# Patient Record
Sex: Female | Born: 1935
Health system: Southern US, Community
[De-identification: ages and names within clinical notes are randomized; demographics above are authoritative.]

## PROBLEM LIST (undated history)

## (undated) DIAGNOSIS — E119 Type 2 diabetes mellitus without complications: Secondary | ICD-10-CM

## (undated) DIAGNOSIS — I2510 Atherosclerotic heart disease of native coronary artery without angina pectoris: Secondary | ICD-10-CM

## (undated) DIAGNOSIS — I10 Essential (primary) hypertension: Secondary | ICD-10-CM

## (undated) DIAGNOSIS — E7800 Pure hypercholesterolemia, unspecified: Secondary | ICD-10-CM

## (undated) DIAGNOSIS — M1990 Unspecified osteoarthritis, unspecified site: Secondary | ICD-10-CM

## (undated) DIAGNOSIS — I509 Heart failure, unspecified: Secondary | ICD-10-CM

## (undated) HISTORY — DX: Type 2 diabetes mellitus without complications: E11.9

## (undated) HISTORY — DX: Unspecified osteoarthritis, unspecified site: M19.90

## (undated) HISTORY — PX: CORONARY ARTERY BYPASS GRAFT: SHX141

## (undated) HISTORY — PX: PACEMAKER INSERTION: SHX728

## (undated) HISTORY — DX: Pure hypercholesterolemia, unspecified: E78.00

## (undated) HISTORY — PX: CHOLECYSTECTOMY: SHX55

## (undated) HISTORY — DX: Essential (primary) hypertension: I10

---

## 2003-10-08 ENCOUNTER — Other Ambulatory Visit: Payer: Self-pay

## 2005-01-28 ENCOUNTER — Ambulatory Visit: Payer: Self-pay | Admitting: Gastroenterology

## 2005-06-29 ENCOUNTER — Ambulatory Visit: Payer: Self-pay | Admitting: Rheumatology

## 2005-09-07 ENCOUNTER — Emergency Department: Payer: Self-pay | Admitting: Emergency Medicine

## 2007-08-30 ENCOUNTER — Ambulatory Visit: Payer: Self-pay | Admitting: Family Medicine

## 2007-10-25 ENCOUNTER — Encounter: Payer: Self-pay | Admitting: Unknown Physician Specialty

## 2008-01-16 ENCOUNTER — Other Ambulatory Visit: Payer: Self-pay

## 2008-01-16 ENCOUNTER — Emergency Department: Payer: Self-pay | Admitting: Emergency Medicine

## 2008-05-22 ENCOUNTER — Ambulatory Visit: Payer: Self-pay | Admitting: Gastroenterology

## 2008-10-21 ENCOUNTER — Emergency Department: Payer: Self-pay | Admitting: Emergency Medicine

## 2008-10-22 ENCOUNTER — Emergency Department: Payer: Self-pay | Admitting: Unknown Physician Specialty

## 2009-02-26 ENCOUNTER — Emergency Department: Payer: Self-pay | Admitting: Emergency Medicine

## 2009-07-10 ENCOUNTER — Ambulatory Visit: Payer: Self-pay | Admitting: Family Medicine

## 2009-08-05 ENCOUNTER — Emergency Department: Payer: Self-pay | Admitting: Emergency Medicine

## 2009-09-06 ENCOUNTER — Ambulatory Visit: Payer: Self-pay | Admitting: Gastroenterology

## 2009-11-26 ENCOUNTER — Emergency Department: Payer: Self-pay | Admitting: Emergency Medicine

## 2010-02-18 ENCOUNTER — Ambulatory Visit: Payer: Self-pay | Admitting: Gastroenterology

## 2010-04-07 ENCOUNTER — Inpatient Hospital Stay: Payer: Self-pay | Admitting: Internal Medicine

## 2010-08-07 ENCOUNTER — Emergency Department: Payer: Self-pay | Admitting: Emergency Medicine

## 2010-09-11 ENCOUNTER — Emergency Department: Payer: Self-pay | Admitting: Emergency Medicine

## 2010-11-10 ENCOUNTER — Ambulatory Visit: Payer: Self-pay | Admitting: General Practice

## 2010-11-24 ENCOUNTER — Ambulatory Visit: Payer: Self-pay | Admitting: General Practice

## 2010-12-15 ENCOUNTER — Ambulatory Visit: Payer: Self-pay | Admitting: Urology

## 2010-12-16 ENCOUNTER — Ambulatory Visit: Payer: Self-pay | Admitting: Urology

## 2011-11-17 ENCOUNTER — Ambulatory Visit: Payer: Self-pay | Admitting: General Practice

## 2011-11-17 LAB — CBC WITH DIFFERENTIAL/PLATELET
Basophil %: 0.9 %
Eosinophil #: 0.2 10*3/uL (ref 0.0–0.7)
Eosinophil %: 2.7 %
HCT: 32.9 % — ABNORMAL LOW (ref 35.0–47.0)
Lymphocyte %: 28.1 %
MCH: 28.9 pg (ref 26.0–34.0)
MCHC: 33 g/dL (ref 32.0–36.0)
Monocyte #: 0.4 x10 3/mm (ref 0.2–0.9)
Monocyte %: 8.1 %
Neutrophil #: 3.3 10*3/uL (ref 1.4–6.5)
Neutrophil %: 60.2 %
Platelet: 204 10*3/uL (ref 150–440)
RBC: 3.76 10*6/uL — ABNORMAL LOW (ref 3.80–5.20)

## 2011-11-17 LAB — BASIC METABOLIC PANEL
Anion Gap: 5 — ABNORMAL LOW (ref 7–16)
BUN: 31 mg/dL — ABNORMAL HIGH (ref 7–18)
Calcium, Total: 9 mg/dL (ref 8.5–10.1)
Co2: 28 mmol/L (ref 21–32)
EGFR (Non-African Amer.): 45 — ABNORMAL LOW
Glucose: 183 mg/dL — ABNORMAL HIGH (ref 65–99)
Osmolality: 289 (ref 275–301)
Sodium: 139 mmol/L (ref 136–145)

## 2011-11-27 ENCOUNTER — Ambulatory Visit: Payer: Self-pay | Admitting: General Practice

## 2012-02-04 ENCOUNTER — Emergency Department: Payer: Self-pay | Admitting: *Deleted

## 2012-02-22 DIAGNOSIS — Z950 Presence of cardiac pacemaker: Secondary | ICD-10-CM | POA: Insufficient documentation

## 2012-02-24 ENCOUNTER — Ambulatory Visit: Payer: Self-pay | Admitting: Cardiology

## 2012-02-24 ENCOUNTER — Inpatient Hospital Stay: Payer: Self-pay | Admitting: Internal Medicine

## 2012-02-24 DIAGNOSIS — I4891 Unspecified atrial fibrillation: Secondary | ICD-10-CM

## 2012-02-24 LAB — BASIC METABOLIC PANEL
Calcium, Total: 9.3 mg/dL (ref 8.5–10.1)
Co2: 24 mmol/L (ref 21–32)
Creatinine: 1 mg/dL (ref 0.60–1.30)
EGFR (Non-African Amer.): 51 — ABNORMAL LOW
Sodium: 138 mmol/L (ref 136–145)

## 2012-02-24 LAB — CBC WITH DIFFERENTIAL/PLATELET
Basophil %: 0.4 %
Eosinophil #: 0.2 10*3/uL (ref 0.0–0.7)
HCT: 33.8 % — ABNORMAL LOW (ref 35.0–47.0)
HGB: 11.5 g/dL — ABNORMAL LOW (ref 12.0–16.0)
MCH: 29.6 pg (ref 26.0–34.0)
MCHC: 34.1 g/dL (ref 32.0–36.0)
Monocyte #: 0.7 x10 3/mm (ref 0.2–0.9)
Monocyte %: 9.6 %
Neutrophil #: 4.1 10*3/uL (ref 1.4–6.5)
Neutrophil %: 61 %
RDW: 13.3 % (ref 11.5–14.5)
WBC: 6.8 10*3/uL (ref 3.6–11.0)

## 2012-02-24 LAB — CBC
HCT: 35.5 % (ref 35.0–47.0)
HGB: 12 g/dL (ref 12.0–16.0)
MCH: 29 pg (ref 26.0–34.0)
MCHC: 33.7 g/dL (ref 32.0–36.0)
MCV: 86 fL (ref 80–100)
RBC: 4.12 10*6/uL (ref 3.80–5.20)

## 2012-02-24 LAB — COMPREHENSIVE METABOLIC PANEL
Albumin: 3.8 g/dL (ref 3.4–5.0)
Alkaline Phosphatase: 104 U/L (ref 50–136)
BUN: 27 mg/dL — ABNORMAL HIGH (ref 7–18)
Bilirubin,Total: 0.5 mg/dL (ref 0.2–1.0)
Calcium, Total: 9.4 mg/dL (ref 8.5–10.1)
Creatinine: 1.01 mg/dL (ref 0.60–1.30)
Glucose: 110 mg/dL — ABNORMAL HIGH (ref 65–99)
Osmolality: 287 (ref 275–301)
Sodium: 141 mmol/L (ref 136–145)
Total Protein: 7.6 g/dL (ref 6.4–8.2)

## 2012-02-24 LAB — PROTIME-INR
INR: 1
Prothrombin Time: 13.2 secs (ref 11.5–14.7)

## 2012-02-24 LAB — APTT: Activated PTT: 34.2 secs (ref 23.6–35.9)

## 2012-02-24 LAB — CK TOTAL AND CKMB (NOT AT ARMC): CK-MB: 1.6 ng/mL (ref 0.5–3.6)

## 2012-02-25 LAB — TROPONIN I: Troponin-I: 0.39 ng/mL — ABNORMAL HIGH

## 2012-02-25 LAB — CBC WITH DIFFERENTIAL/PLATELET
Basophil #: 0 10*3/uL (ref 0.0–0.1)
Eosinophil #: 0.2 10*3/uL (ref 0.0–0.7)
Eosinophil %: 3.4 %
HCT: 31.9 % — ABNORMAL LOW (ref 35.0–47.0)
Lymphocyte #: 2.1 10*3/uL (ref 1.0–3.6)
MCH: 29.7 pg (ref 26.0–34.0)
MCHC: 34.4 g/dL (ref 32.0–36.0)
MCV: 86 fL (ref 80–100)
Monocyte #: 0.5 x10 3/mm (ref 0.2–0.9)
Neutrophil #: 3.9 10*3/uL (ref 1.4–6.5)
Platelet: 205 10*3/uL (ref 150–440)
RDW: 13.6 % (ref 11.5–14.5)
WBC: 6.8 10*3/uL (ref 3.6–11.0)

## 2012-02-25 LAB — BASIC METABOLIC PANEL
Anion Gap: 7 (ref 7–16)
BUN: 27 mg/dL — ABNORMAL HIGH (ref 7–18)
EGFR (African American): 45 — ABNORMAL LOW
EGFR (Non-African Amer.): 39 — ABNORMAL LOW
Glucose: 122 mg/dL — ABNORMAL HIGH (ref 65–99)
Osmolality: 288 (ref 275–301)
Potassium: 4.6 mmol/L (ref 3.5–5.1)

## 2012-02-25 LAB — LIPID PANEL
Cholesterol: 167 mg/dL (ref 0–200)
HDL Cholesterol: 31 mg/dL — ABNORMAL LOW (ref 40–60)
Ldl Cholesterol, Calc: 73 mg/dL (ref 0–100)
Triglycerides: 313 mg/dL — ABNORMAL HIGH (ref 0–200)
VLDL Cholesterol, Calc: 63 mg/dL — ABNORMAL HIGH (ref 5–40)

## 2012-02-25 LAB — MAGNESIUM: Magnesium: 1.5 mg/dL — ABNORMAL LOW

## 2012-02-25 LAB — HEMOGLOBIN A1C: Hemoglobin A1C: 7 % — ABNORMAL HIGH (ref 4.2–6.3)

## 2012-02-25 LAB — CK TOTAL AND CKMB (NOT AT ARMC)
CK, Total: 47 U/L (ref 21–215)
CK, Total: 57 U/L (ref 21–215)

## 2012-03-02 ENCOUNTER — Ambulatory Visit: Payer: Self-pay | Admitting: Cardiology

## 2012-11-01 ENCOUNTER — Ambulatory Visit: Payer: Self-pay | Admitting: Unknown Physician Specialty

## 2012-12-04 ENCOUNTER — Ambulatory Visit: Payer: Self-pay | Admitting: Emergency Medicine

## 2013-11-01 ENCOUNTER — Ambulatory Visit: Payer: Self-pay | Admitting: Podiatry

## 2013-11-01 DIAGNOSIS — I4891 Unspecified atrial fibrillation: Secondary | ICD-10-CM | POA: Insufficient documentation

## 2013-11-01 DIAGNOSIS — Z951 Presence of aortocoronary bypass graft: Secondary | ICD-10-CM | POA: Insufficient documentation

## 2013-11-01 DIAGNOSIS — E785 Hyperlipidemia, unspecified: Secondary | ICD-10-CM | POA: Insufficient documentation

## 2013-11-15 ENCOUNTER — Ambulatory Visit (INDEPENDENT_AMBULATORY_CARE_PROVIDER_SITE_OTHER): Payer: Medicare HMO

## 2013-11-15 ENCOUNTER — Ambulatory Visit: Payer: Self-pay | Admitting: Podiatry

## 2013-11-15 ENCOUNTER — Ambulatory Visit (INDEPENDENT_AMBULATORY_CARE_PROVIDER_SITE_OTHER): Payer: Medicare HMO | Admitting: Podiatry

## 2013-11-15 ENCOUNTER — Encounter: Payer: Self-pay | Admitting: Podiatry

## 2013-11-15 VITALS — Ht 59.0 in | Wt 143.0 lb

## 2013-11-15 DIAGNOSIS — E1149 Type 2 diabetes mellitus with other diabetic neurological complication: Secondary | ICD-10-CM

## 2013-11-15 DIAGNOSIS — M129 Arthropathy, unspecified: Secondary | ICD-10-CM

## 2013-11-15 DIAGNOSIS — M1990 Unspecified osteoarthritis, unspecified site: Secondary | ICD-10-CM

## 2013-11-15 DIAGNOSIS — Q6650 Congenital pes planus, unspecified foot: Secondary | ICD-10-CM

## 2013-11-15 DIAGNOSIS — E1140 Type 2 diabetes mellitus with diabetic neuropathy, unspecified: Secondary | ICD-10-CM

## 2013-11-15 DIAGNOSIS — E1142 Type 2 diabetes mellitus with diabetic polyneuropathy: Secondary | ICD-10-CM

## 2013-11-15 NOTE — Patient Instructions (Signed)
Diabetes and Foot Care Diabetes may cause you to have problems because of poor blood supply (circulation) to your feet and legs. This may cause the skin on your feet to become thinner, break easier, and heal more slowly. Your skin may become dry, and the skin may peel and crack. You may also have nerve damage in your legs and feet causing decreased feeling in them. You may not notice minor injuries to your feet that could lead to infections or more serious problems. Taking care of your feet is one of the most important things you can do for yourself.  HOME CARE INSTRUCTIONS  Wear shoes at all times, even in the house. Do not go barefoot. Bare feet are easily injured.  Check your feet daily for blisters, cuts, and redness. If you cannot see the bottom of your feet, use a mirror or ask someone for help.  Wash your feet with warm water (do not use hot water) and mild soap. Then pat your feet and the areas between your toes until they are completely dry. Do not soak your feet as this can dry your skin.  Apply a moisturizing lotion or petroleum jelly (that does not contain alcohol and is unscented) to the skin on your feet and to dry, brittle toenails. Do not apply lotion between your toes.  Trim your toenails straight across. Do not dig under them or around the cuticle. File the edges of your nails with an emery board or nail file.  Do not cut corns or calluses or try to remove them with medicine.  Wear clean socks or stockings every day. Make sure they are not too tight. Do not wear knee-high stockings since they may decrease blood flow to your legs.  Wear shoes that fit properly and have enough cushioning. To break in new shoes, wear them for just a few hours a day. This prevents you from injuring your feet. Always look in your shoes before you put them on to be sure there are no objects inside.  Do not cross your legs. This may decrease the blood flow to your feet.  If you find a minor scrape,  cut, or break in the skin on your feet, keep it and the skin around it clean and dry. These areas may be cleansed with mild soap and water. Do not cleanse the area with peroxide, alcohol, or iodine.  When you remove an adhesive bandage, be sure not to damage the skin around it.  If you have a wound, look at it several times a day to make sure it is healing.  Do not use heating pads or hot water bottles. They may burn your skin. If you have lost feeling in your feet or legs, you may not know it is happening until it is too late.  Make sure your health care provider performs a complete foot exam at least annually or more often if you have foot problems. Report any cuts, sores, or bruises to your health care provider immediately. SEEK MEDICAL CARE IF:   You have an injury that is not healing.  You have cuts or breaks in the skin.  You have an ingrown nail.  You notice redness on your legs or feet.  You feel burning or tingling in your legs or feet.  You have pain or cramps in your legs and feet.  Your legs or feet are numb.  Your feet always feel cold. SEEK IMMEDIATE MEDICAL CARE IF:   There is increasing redness,   swelling, or pain in or around a wound.  There is a red line that goes up your leg.  Pus is coming from a wound.  You develop a fever or as directed by your health care provider.  You notice a bad smell coming from an ulcer or wound. Document Released: 06/26/2000 Document Revised: 03/01/2013 Document Reviewed: 12/06/2012 [employer] Patient Information 2014 [employer].  

## 2013-11-15 NOTE — Progress Notes (Signed)
   Subjective:    Patient ID: Veronica Short, female    DOB: 03-Jan-1924, 80 y.o.   MRN: [id number]  HPI Comments: Both feet been hurting and burning.. Its been going on for years. Dr Alberteen Spindleline has been treating for toenails and the pain. Does nothing for me   Diabetes Hypoglycemia symptoms include dizziness, headaches and nervousness/anxiousness. Associated symptoms include fatigue and weakness.      Review of Systems  Constitutional: Positive for activity change, appetite change and fatigue.  HENT: Positive for ear pain.   Eyes: Positive for pain.  Respiratory: Positive for cough and shortness of breath.   Endocrine:       Diabetes  Excessive thirst   Genitourinary: Positive for frequency.  Musculoskeletal: Positive for back pain.       Muscle pain  Joint pain  Difficulty walking   Neurological: Positive for dizziness, weakness and headaches.  Psychiatric/Behavioral: The patient is nervous/anxious.        Objective:   Physical Exam: I have reviewed her past medical history medications allergies surgeries social history and review of systems. Pulses are nonpalpable bilateral. Neurologic sensorium is diminished per since once the monofilament. Deep tendon reflexes are in non-elicitable. Muscle strength is limited to dorsiflexion and plantar flexion of only. She has no inversion or eversion capabilities. Orthopedic evaluation demonstrates severe pes planus with posterior tibial tendon dysfunction resulting in lateralization abduction of the foot on the leg. Radiographic evaluation confirms severe osteoarthritis and pes planus bilateral. Severe hammertoe deformities also noted bilateral. I am unable to get her heel into a neutral position beneath her leg. Cutaneous evaluation demonstrates thick yellow dystrophic clinically mycotic nails bilaterally.        Assessment & Plan:  Assessment: Diabetic peripheral neuropathy with severe osteoarthritis and pes planus with posterior tibial  tendon dysfunction.  Plan: Discussed etiology pathology conservative versus surgical therapies I expressed to her and her family that I would like to get her started on Neurontin. At this point she states that she's tried Neurontin before but a culture to be dizzy. For this reason I have given her samples of 75 mg and Lyrica one by mouth each bedtime and I will followup with her in one month at which time we will debride her toenails. We will also reevaluate her medication.

## 2013-11-20 DIAGNOSIS — I2510 Atherosclerotic heart disease of native coronary artery without angina pectoris: Secondary | ICD-10-CM | POA: Insufficient documentation

## 2013-11-20 DIAGNOSIS — M810 Age-related osteoporosis without current pathological fracture: Secondary | ICD-10-CM | POA: Insufficient documentation

## 2013-11-20 DIAGNOSIS — E669 Obesity, unspecified: Secondary | ICD-10-CM | POA: Insufficient documentation

## 2013-11-20 DIAGNOSIS — M5137 Other intervertebral disc degeneration, lumbosacral region: Secondary | ICD-10-CM | POA: Insufficient documentation

## 2013-11-20 DIAGNOSIS — I359 Nonrheumatic aortic valve disorder, unspecified: Secondary | ICD-10-CM | POA: Insufficient documentation

## 2013-11-20 DIAGNOSIS — E119 Type 2 diabetes mellitus without complications: Secondary | ICD-10-CM | POA: Insufficient documentation

## 2013-11-27 ENCOUNTER — Telehealth: Payer: Self-pay | Admitting: *Deleted

## 2013-11-27 NOTE — Telephone Encounter (Signed)
Called and spoke with pt letting her know she had appt at [facility] letting her know she has an appt thur 5.21.15 at 3:15. Pt states she did know about the appt. Pt also stated she could not take the pill dr Al Corpushyatt gave her. It caused her to be dizzy and gave her the shakes. She did stop taking the rx for lyrica.

## 2014-01-08 ENCOUNTER — Ambulatory Visit (INDEPENDENT_AMBULATORY_CARE_PROVIDER_SITE_OTHER): Payer: Medicare HMO | Admitting: Podiatry

## 2014-01-08 ENCOUNTER — Encounter: Payer: Self-pay | Admitting: Podiatry

## 2014-01-08 DIAGNOSIS — M79609 Pain in unspecified limb: Secondary | ICD-10-CM

## 2014-01-08 DIAGNOSIS — M79676 Pain in unspecified toe(s): Secondary | ICD-10-CM

## 2014-01-08 DIAGNOSIS — B353 Tinea pedis: Secondary | ICD-10-CM

## 2014-01-08 NOTE — Progress Notes (Signed)
She presents today with a chief complaint of painful elongated toenails.  Objective: Nails are thick yellow dystrophic with mycotic and painful palpation.  Assessment: Pain in limb secondary to onychomycosis 1 through 5 bilateral.  Plan: Debridement of nails 1 through 5 bilateral covered service secondary to pain 

## 2014-03-23 DIAGNOSIS — M1990 Unspecified osteoarthritis, unspecified site: Secondary | ICD-10-CM | POA: Insufficient documentation

## 2014-04-16 ENCOUNTER — Ambulatory Visit (INDEPENDENT_AMBULATORY_CARE_PROVIDER_SITE_OTHER): Payer: Medicare HMO | Admitting: Podiatry

## 2014-04-16 DIAGNOSIS — B351 Tinea unguium: Secondary | ICD-10-CM

## 2014-04-16 DIAGNOSIS — M79676 Pain in unspecified toe(s): Secondary | ICD-10-CM

## 2014-04-16 NOTE — Progress Notes (Signed)
She presents today with chief complaint of painful elongated toenails. ° °Objective: Pulses remain palpable. Hammertoe Deformities. Nails are thick yellow dystrophic onychomycotic and painful palpation. ° °Assessment: Pain in limb secondary to onychomycosis. ° °Plan: Read nails 1 through 5 bilateral. °

## 2014-07-17 DIAGNOSIS — E119 Type 2 diabetes mellitus without complications: Secondary | ICD-10-CM | POA: Diagnosis not present

## 2014-07-17 DIAGNOSIS — E780 Pure hypercholesterolemia: Secondary | ICD-10-CM | POA: Diagnosis not present

## 2014-07-17 DIAGNOSIS — Z79899 Other long term (current) drug therapy: Secondary | ICD-10-CM | POA: Diagnosis not present

## 2014-07-23 ENCOUNTER — Ambulatory Visit: Payer: Medicare HMO | Admitting: Podiatry

## 2014-07-23 ENCOUNTER — Ambulatory Visit (INDEPENDENT_AMBULATORY_CARE_PROVIDER_SITE_OTHER): Payer: Medicare HMO | Admitting: Podiatry

## 2014-07-23 DIAGNOSIS — B351 Tinea unguium: Secondary | ICD-10-CM

## 2014-07-23 DIAGNOSIS — M79676 Pain in unspecified toe(s): Secondary | ICD-10-CM

## 2014-07-23 NOTE — Progress Notes (Signed)
She presents today with chief complaint of painful elongated toenails.  Objective: Pulses remain palpable. Hammertoe Deformities. Nails are thick yellow dystrophic onychomycotic and painful palpation.  Assessment: Pain in limb secondary to onychomycosis.  Plan: Read nails 1 through 5 bilateral.

## 2014-09-18 DIAGNOSIS — E119 Type 2 diabetes mellitus without complications: Secondary | ICD-10-CM | POA: Diagnosis not present

## 2014-09-28 DIAGNOSIS — E780 Pure hypercholesterolemia: Secondary | ICD-10-CM | POA: Diagnosis not present

## 2014-09-28 DIAGNOSIS — E119 Type 2 diabetes mellitus without complications: Secondary | ICD-10-CM | POA: Diagnosis not present

## 2014-09-28 DIAGNOSIS — I10 Essential (primary) hypertension: Secondary | ICD-10-CM | POA: Diagnosis not present

## 2014-09-28 DIAGNOSIS — I4891 Unspecified atrial fibrillation: Secondary | ICD-10-CM | POA: Diagnosis not present

## 2014-10-11 DIAGNOSIS — I4891 Unspecified atrial fibrillation: Secondary | ICD-10-CM | POA: Diagnosis not present

## 2014-10-12 ENCOUNTER — Ambulatory Visit (INDEPENDENT_AMBULATORY_CARE_PROVIDER_SITE_OTHER): Payer: Commercial Managed Care - HMO | Admitting: Podiatry

## 2014-10-12 DIAGNOSIS — M779 Enthesopathy, unspecified: Secondary | ICD-10-CM | POA: Diagnosis not present

## 2014-10-12 DIAGNOSIS — M79676 Pain in unspecified toe(s): Secondary | ICD-10-CM

## 2014-10-12 DIAGNOSIS — B351 Tinea unguium: Secondary | ICD-10-CM | POA: Diagnosis not present

## 2014-10-12 MED ORDER — TRIAMCINOLONE ACETONIDE 10 MG/ML IJ SUSP
10.0000 mg | Freq: Once | INTRAMUSCULAR | Status: AC
  Administered 2014-10-12: 10 mg

## 2014-10-12 NOTE — Progress Notes (Signed)
Subjective:     Patient ID: Marcello Fennelrma R Soto, female   DOB: 22-Jan-1924, 81 y.o.   MRN: [id number]  HPI patient presents with painful nailbeds 1-5 both feet that are yellow and dystrophic and also is noted to have pain in the ankle joint of both feet with inability to walk comfortably with severe flatfoot deformity noted   Review of Systems     Objective:   Physical Exam Inflammatory capsulitis of the sinus tarsi bilateral with nail disease and thickness 1-5 of both feet that are painful with yellow dystrophic disease    Assessment:     Capsulitis of the sinus tarsi bilateral along with mycotic nail infections 1-5 both feet with pain    Plan:     Debride painful nailbeds 1-5 of both feet with no iatrogenic bleeding noted and injected the capsule of the sinus tarsi bilateral 3 mg Kenalog 5 mg Xylocaine Marcaine mixture that was tolerated well and gave relief of symptoms

## 2014-10-12 NOTE — Progress Notes (Signed)
She presents today with chief complaint of painful elongated toenails.  Objective: Pulses remain palpable. Hammertoe Deformities. Nails are thick yellow dystrophic onychomycotic and painful palpation.  Assessment: Pain in limb secondary to onychomycosis.  Plan: Read nails 1 through 5 bilateral.

## 2014-10-14 DIAGNOSIS — M19012 Primary osteoarthritis, left shoulder: Secondary | ICD-10-CM | POA: Diagnosis not present

## 2014-10-14 DIAGNOSIS — M25512 Pain in left shoulder: Secondary | ICD-10-CM | POA: Diagnosis not present

## 2014-10-22 ENCOUNTER — Other Ambulatory Visit: Payer: Medicare HMO

## 2014-10-30 NOTE — Discharge Summary (Signed)
PATIENT NAMMarcello Short:  Campillo, Dorlisa R MR#:  [id number] DATE OF BIRTH:  1924-03-17  DATE OF ADMISSION:  02/24/2012 DATE OF DISCHARGE:  02/25/2012  DIAGNOSES: Atrial fibrillation with rapid ventricular response (RVR), history of coronary artery disease status post coronary artery bypass graft (CABG) with subsequent angioplasty and stenting, hypertension, diabetes, gastroesophageal reflux disease, depression, anxiety, elevated troponin due to demand ischemia, hypomagnesemia.   DISPOSITION: The patient is being discharged home.   FOLLOWUP: Follow up with primary care physician, Dr. Randa LynnLamb, and cardiologist Dr. Darrold JunkerParaschos in 1 to 2 weeks after discharge.   DIET: Low sodium, 1800 calorie ADA diet.   ACTIVITY: As tolerated.  The patient has been advised to hold her aspirin and Plavix as advised for upcoming pacemaker placement.  DISCHARGE MEDICATIONS: Lipitor 20 mg daily, omeprazole 20 mg 1 capsule in the morning half hour before breakfast, Plavix 75 mg daily, hydrochlorothiazide/lisinopril12.5/20 one tablet daily, atenolol 100 mg daily, calcium and vitamin D 1 tablet daily, Xanax 0.25 mg  half to 1 tablet b.i.d. as needed, vitamin B12 1000 mcg daily, artificial tears two drops to affected eye once a day, aspirin 325 mg daily.  LABORATORY, DIAGNOSTIC AND RADIOLOGICAL DATA: CBC essentially normal other than hemoglobin of 11.5. Chest x-ray: No acute abnormality. Troponins 0.28 to 0.39. VLDL 63, LDL 73. Magnesium 1.5. Cholesterol 167, triglycerides 313, HDL 331. Hemoglobin A1c 7.   HOSPITAL COURSE: The patient is an 81 year old female with past medical history of coronary artery disease status post coronary artery bypass graft with subsequent stenting and angioplasty, diabetes, anxiety, depression and atrial fibrillation. The patient is scheduled to have a pacemaker placed this coming Wednesday. She went for a preop exam and was found to be tachycardic; therefore, she was sent to the urgent care who referred her to  the Emergency Room. From the Emergency Room, the patient got admitted because her heart rate was high, and she had borderline elevated troponin.  Following hospitalization, the patient has had no symptoms. Her heart rate has been in the 120s. Her troponins are mildly elevated, but she has no chest pain. The patient was seen by Dr. Darrold JunkerParaschos who recommended discharging the patient home.  The patient is scheduled for a pacemaker coming Wednesday. She should hold her Plavix and aspirin as advised. Dr. Darrold JunkerParaschos did not want to start her on any new rate-lowering agents since she had pauses on Cardizem recently. As per Dr. Darrold JunkerParaschos, a slightly high rate until insertion of pacemaker is acceptable. He had thought that the patient's anxiety also played a major part in her elevated heart rate. This plan was discussed with the patient and the family, and they were comfortable with the plan to discharge.  She had minimally elevated troponin, which was found to be due to demand ischemia from her elevated heart rate.  Her diabetes is overall controlled. Hemoglobin A1c is 7.  She has elevated VLDL and triglycerides, but her LDL is at goal. She is on statin therapy.  She had low magnesium, which was supplemented.  The patient is being discharged home in a stable condition. She has been advised to hold her aspirin and Plavix until the pacemaker insertion on coming Wednesday.   TIME SPENT: 45 minutes.    ____________________________ Darrick MeigsSangeeta Quinnetta Roepke, MD sp:kma D: 02/25/2012 15:52:42 ET T: 02/26/2012 12:03:01 ET JOB#: [id number]  cc: Darrick MeigsSangeeta Arjen Deringer, MD, <Dictator> Darrick MeigsSANGEETA Ghazi Rumpf MD ELECTRONICALLY SIGNED 02/26/2012 15:01

## 2014-10-30 NOTE — Op Note (Signed)
PATIENT NAME:  Veronica Short, Veronica R MR#:  [id number] DATE OF BIRTH:  May 23, 1924  DATE OF PROCEDURE:  03/02/2012  PRIMARY CARE PHYSICIAN: Alonna BucklerAndrew Lamb, MD   PREPROCEDURE DIAGNOSIS:  1. Symptomatic bradycardia and sick sinus syndrome.  2. Atrial fibrillation.   PROCEDURE: Single-chamber pacemaker implantation.   POSTPROCEDURE DIAGNOSIS: Intermittent ventricular pacing.   INDICATION: The patient is an 81 year old female with new onset of atrial fibrillation. The patient recently was experiencing rapid atrial fibrillation and was started on Cardizem in addition to Atenolol. Subsequently, the patient developed presyncopal symptoms. Here, Holter monitor revealed atrial fibrillation with pauses with RR intervals of 5.77 seconds. Cardizem had to be discontinued. The procedure, risks, benefits, and alternatives of pacemaker implantation were explained to the patient and informed written consent was obtained.   DESCRIPTION OF PROCEDURE: She was brought to the Operating Room in a fasting state. The left pectoral region was prepped and draped in the usual sterile manner. Anesthesia was obtained with 1% Xylocaine locally. A 6 cm incision was performed over the left pectoral region. The pacemaker pocket was generated by electrocautery and blunt dissection. Access was attained to the left subclavian vein by fine needle aspiration. A Medtronic ventricular lead was positioned in the right ventricular apical septum. After proper thresholds were obtained, the lead was sutured in place. The pacemaker pocket was irrigated with gentamicin solution. The lead was connected to a single-chamber rate responsive pacemaker generator and positioned in the pocket. The pocket was closed with 2-0 and 4-0 Vicryl, respectively. Steri-Strips and pressure dressing were applied. ____________________________ Veronica MillardAlexander Elysia Grand, MD ap:cbb D: 03/02/2012 13:35:47 ET T: 03/02/2012 15:19:44 ET JOB#: [id number] cc: Veronica MillardAlexander Aerilynn Goin, MD,  <Dictator> Veronica MillardALEXANDER Jamielee Mchale MD ELECTRONICALLY SIGNED 03/16/2012 9:00

## 2014-10-30 NOTE — H&P (Signed)
PATIENT NAMMarcello Short:  Cottam, Shala R MR#:  [id number] DATE OF BIRTH:  07-13-1924  DATE OF ADMISSION:  02/24/2012  REFERRING PHYSICIAN: Dr. Enedina FinnerGoli   PCP: Dr. Randa LynnLamb   CARDIOLOGIST: Dr. Darrold JunkerParaschos   CHIEF COMPLAINT: Atrial fibrillation with RVR.   HISTORY OF PRESENT ILLNESS: The patient is a pleasant 81 year old Caucasian female with history of CAD status post CABG and stenting and what appears to be sick sinus syndrome with pauses of up to six seconds who was to have a permanent pacemaker placed by Dr. Darrold JunkerParaschos on the 21st of this month who was at preop exam today and was found to have tachycardia and atrial fibrillation with RVR and referred here. Of note, the patient and family state that the patient was diagnosed with atrial fibrillation about a week and a half to two weeks ago. They don't know how long she has been having the atrial fibrillation. She was placed on a monitor around the same time and was started on diltiazem 180 mg XL nightly. The Holter monitor picked up bradycardia with rates of 50's per family and the Cardizem was stopped. Since then she has been having weakness and some dizziness. There is no chest pain. There is some shortness of breath and some dyspnea on exertion. After she came here she was found with atrial fibrillation, rates of 190's, and given the elevated troponin of 0.28 hospitalist service was contacted for further evaluation and management. Of note, the patient has received metoprolol IV x2. She is on the monitor and the rate goes from 130's to 140's, however, does not appear to be atrial fibrillation and is more sustained. She is complaining of a burning in her chest without any chest pains currently.   PAST MEDICAL HISTORY:  1. Coronary artery disease, status post CABG in 1992. 2. History of angioplasty and stenting, the recent one being in 2012. 3. Bladder suspension surgery. 4. Cholecystectomy. 5. Hypertension. 6. Diabetes.  7. Gastroesophageal reflux disease.   8. Depression. 9. Arthritis.   ALLERGIES: Penicillin and morphine.   CURRENT MEDICATIONS:  1. Alprazolam 0.25 mg one-half to 1 tab 2 times a day as needed for anxiety.  2. Artificial tears two drops in affected eye as needed.  3. Aspirin 325 mg daily.  4. Atenolol 100 mg daily.  5. Lipitor 20 mg daily.  6. Calcium with D 1 tab daily.  7. Hydrochlorothiazide/lisinopril 12.5/20 mg 1 tab daily.  8. Omeprazole 20 mg daily.  9. Plavix 75 mg daily.  10. Vitamin B12 1000 mcg daily.   SOCIAL HISTORY: No tobacco, alcohol, or drug use. Lives with her husband.   FAMILY HISTORY: Positive for coronary artery disease.  REVIEW OF SYSTEMS: CONSTITUTIONAL: Positive for fatigue and weakness. No weight changes. EYES: No blurry vision or double vision. ENT: No tinnitus or hearing loss. RESPIRATORY: Positive for on and off cough without wheezing. Positive dyspnea on exertion. CARDIOVASCULAR: No chest pain. No orthopnea. Occasional swelling in the legs. History of recently diagnosed atrial fibrillation. No history of syncope. Positive for palpitations. Positive for high blood pressure. GI: No nausea, vomiting, or diarrhea. No abdominal pain. Positive for GERD. No dark stools. No bloody stools. GU: No dysuria or hematuria. HEME/LYMPH: No anemia or easy bruising. SKIN: Positive for a rash that started on Sunday after starting Flagyl for bacterial vaginosis. Rash on the trunk and extremities. Positive for itch. MUSCULOSKELETAL: Chronic arthritis. NEUROLOGIC: Global weakness. PSYCH: Positive for anxiety.   PHYSICAL EXAMINATION:   VITAL SIGNS: Temperature on arrival 97, initial  pulse rate 76, blood pressure 148/82, respiratory rate 20. Last blood pressure 149/91. Last heart rate 140.   GENERAL: The patient is an elderly Caucasian female laying in bed in no obvious distress.   HEENT: Normocephalic, atraumatic. Pupils are equal and reactive. Anicteric sclerae. Extraocular muscles intact. Moist mucous membranes.    NECK: Supple. No cervical lymphadenopathy.   CARDIOVASCULAR: S1, S2, tachycardic. No murmurs appreciated. No rubs or gallops.   LUNGS: Clear to auscultation without wheezing or rhonchi. Good effort.   ABDOMEN: Soft, nontender, nondistended. Positive bowel sounds in all quadrants. No organomegaly appreciated.   EXTREMITIES: Some varicose veins, otherwise no lower extremity edema.   SKIN: Positive for irregular patchy rash bilateral extremities more on the lower than upper and some in the right flank. No oozing or drainage, greater than 1 cm mostly with irregular borders.   NEUROLOGIC: Cranial nerves II through XII grossly intact. Strength is 5/5 in all extremities.   PSYCH: Awake, alert, oriented x3, conversant, pleasant.   LABORATORY, DIAGNOSTIC, AND RADIOLOGICAL DATA: Glucose 110, BUN 27, creatinine 1.01, GFR 51. LFTs within normal limits. Troponin 0.28. CK-MB 1.6. Hemoglobin 12, hematocrit 35.5, WBC 7.3, platelets 220. INR 1.   EKG atrial fibrillation, nonspecific ST abnormalities, rate 93.   Chest, PA and lateral, showing mild cardiomegaly with CABG changes. No acute cardiopulmonary disease.   ASSESSMENT AND PLAN: We have a pleasant 81 year old Caucasian female with history of recent diagnosis of atrial fibrillation who is being evaluated by Dr. Darrold Junker for permanent pacemaker placement for what appears to be sick sinus syndrome with bradycardia and was being evaluated in the preop clinic and noted to have atrial fibrillation with RVR and referred here. Per family, the atrial fibrillation has been going on for at least a week and a half or so but it is unclear when it initially started. She had bradycardic episode with the diltiazem 180 mg dose. At this point she has had two doses of IV metoprolol and, per monitor, at least is not in atrial fibrillation as the rhythm is more sustained and regular but tachycardiac. I think the patient likely has either sinus tachycardia versus possible  SVT after metoprolol IV. At this point we would start low dose diltiazem and place her on a monitor and check for any bradycardia or pauses. Obtain a Cardiology consult. I would also start heparin drip at this point. Resume aspirin and order an echocardiogram. I would hold the hydrochlorothiazide and ACE inhibitor to prevent hypotension. The patient does have [place] score at least 2. The patient does have positive troponin. Although she does not complain of any chest pain, she complains of chest burning and given her CAD history, CABG and stenting, I would rule out acute coronary syndrome. It is also possible that she is having demand ischemia from the tachycardia and atrial fibrillation. It is also possible she is having an NSTEMI. I would cycle the troponins, give aspirin, and give her a dose of Plavix for today and Cardiology can hold it if they deem fit tomorrow. I would also resume her beta-blocker, statin, and get echocardiogram and start on nitroglycerin sublingual p.r.n. for chest pain. I would monitor blood pressure without the ACE inhibitor and ARB. The patient does appear to have CKD, stage III, which appears to be chronic and stable. The patient also does appear to have a rash started after Flagyl. I would give her p.r.n. Benadryl.   CODE STATUS: The patient is FULL CODE.   TOTAL TIME SPENT: 60 minutes.  ____________________________ Krystal Eaton, MD sa:drc D: 02/24/2012 20:00:03 ET T: 02/25/2012 06:24:57 ET JOB#: [id number]  cc: Krystal Eaton, MD, <Dictator> Reola Mosher. Randa Lynn, MD Marcina Millard, MD Krystal Eaton MD ELECTRONICALLY SIGNED 03/04/2012 15:43

## 2014-11-04 NOTE — Op Note (Signed)
PATIENT NAMMarcello Fennel:  Veronica Short, Veronica Short MR#:  [id number] DATE OF BIRTH:  1923/11/26  DATE OF PROCEDURE:  11/27/2011  PREOPERATIVE DIAGNOSIS: Right carpal tunnel syndrome.   POSTOPERATIVE DIAGNOSIS: Right carpal tunnel syndrome.  PROCEDURE PERFORMED: Right carpal tunnel release.   SURGEON: Illene LabradorJames P. Hooten, MD  ANESTHESIA: Bier block.   ESTIMATED BLOOD LOSS: Minimal.   TOURNIQUET TIME: 37 minutes.   DRAINS: None.   INDICATIONS FOR SURGERY: The patient is an 81 year old female who has been seen for complaints of numbness and paresthesias to the right hand. Nerve conduction studies were consistent with right carpal tunnel syndrome. After discussion of the risks and benefits of surgical intervention, the patient expressed her understanding of the risks and benefits and agreed with plans for surgical intervention.   PROCEDURE IN DETAIL: The patient was brought into the operating room and, after adequate Bier block was achieved, the patient's right hand and arm were cleaned and prepped with alcohol and DuraPrep and draped in the usual sterile fashion. A "time out" was performed as per usual protocol. Loupe magnification was used throughout the procedure. A curvilinear incision was made just ulnar to the thenar palmar crease. Dissection was carried down through the palmar fascia to the transverse carpal ligament. Transverse carpal ligament was sharply incised, taking care to protect the underlying structures within the carpal tunnel. Complete release and decompression of carpal tunnel was performed. Inspection of the tunnel showed no evidence of lipoma or ganglion cyst formation. There was initially a fusiform type deformity of the nerve which improved after release of the transverse carpal ligament. The wound was irrigated with copious amounts of normal saline with antibiotic solution. Skin edges were reapproximated using interrupted sutures of 5-0 nylon. 0.25% Marcaine was injected along the incision site. A  sterile dressing was applied followed by application of a volar splint. The tourniquet was deflated after total tourniquet time of 37 minutes.   The patient tolerated the procedure well. She was transported to the recovery room in stable condition.  ____________________________ Illene LabradorJames P. Angie FavaHooten Jr., MD jph:slb D: 11/28/2011 10:13:02 ET T: 11/28/2011 11:48:25 ET JOB#: [id number]  cc: Fayrene FearingJames P. Angie FavaHooten Jr., MD, <Dictator> Illene LabradorJAMES P Angie FavaHOOTEN JR MD ELECTRONICALLY SIGNED 11/28/2011 21:33

## 2014-11-06 DIAGNOSIS — H1011 Acute atopic conjunctivitis, right eye: Secondary | ICD-10-CM | POA: Diagnosis not present

## 2014-11-19 DIAGNOSIS — Z85828 Personal history of other malignant neoplasm of skin: Secondary | ICD-10-CM | POA: Diagnosis not present

## 2014-11-19 DIAGNOSIS — L821 Other seborrheic keratosis: Secondary | ICD-10-CM | POA: Diagnosis not present

## 2014-11-19 DIAGNOSIS — X32XXXA Exposure to sunlight, initial encounter: Secondary | ICD-10-CM | POA: Diagnosis not present

## 2014-11-19 DIAGNOSIS — L570 Actinic keratosis: Secondary | ICD-10-CM | POA: Diagnosis not present

## 2014-12-17 DIAGNOSIS — L02419 Cutaneous abscess of limb, unspecified: Secondary | ICD-10-CM | POA: Diagnosis not present

## 2014-12-17 DIAGNOSIS — H1011 Acute atopic conjunctivitis, right eye: Secondary | ICD-10-CM | POA: Diagnosis not present

## 2014-12-17 DIAGNOSIS — L03119 Cellulitis of unspecified part of limb: Secondary | ICD-10-CM | POA: Diagnosis not present

## 2015-01-02 ENCOUNTER — Observation Stay (HOSPITAL_COMMUNITY)
Admission: EM | Admit: 2015-01-02 | Discharge: 2015-01-04 | Disposition: A | Discharge status: Home / Self Care | Payer: Commercial Managed Care - HMO | Attending: Internal Medicine | Admitting: Internal Medicine

## 2015-01-02 ENCOUNTER — Emergency Department (HOSPITAL_COMMUNITY): Payer: Commercial Managed Care - HMO

## 2015-01-02 ENCOUNTER — Encounter (HOSPITAL_COMMUNITY): Payer: Self-pay | Admitting: Emergency Medicine

## 2015-01-02 DIAGNOSIS — N183 Chronic kidney disease, stage 3 (moderate): Secondary | ICD-10-CM | POA: Diagnosis not present

## 2015-01-02 DIAGNOSIS — Z950 Presence of cardiac pacemaker: Secondary | ICD-10-CM | POA: Diagnosis not present

## 2015-01-02 DIAGNOSIS — Z955 Presence of coronary angioplasty implant and graft: Secondary | ICD-10-CM | POA: Insufficient documentation

## 2015-01-02 DIAGNOSIS — I671 Cerebral aneurysm, nonruptured: Secondary | ICD-10-CM | POA: Insufficient documentation

## 2015-01-02 DIAGNOSIS — R479 Unspecified speech disturbances: Secondary | ICD-10-CM | POA: Diagnosis not present

## 2015-01-02 DIAGNOSIS — I639 Cerebral infarction, unspecified: Principal | ICD-10-CM | POA: Insufficient documentation

## 2015-01-02 DIAGNOSIS — R531 Weakness: Secondary | ICD-10-CM

## 2015-01-02 DIAGNOSIS — G459 Transient cerebral ischemic attack, unspecified: Secondary | ICD-10-CM | POA: Diagnosis present

## 2015-01-02 DIAGNOSIS — Z7982 Long term (current) use of aspirin: Secondary | ICD-10-CM | POA: Insufficient documentation

## 2015-01-02 DIAGNOSIS — E7800 Pure hypercholesterolemia, unspecified: Secondary | ICD-10-CM | POA: Diagnosis present

## 2015-01-02 DIAGNOSIS — I495 Sick sinus syndrome: Secondary | ICD-10-CM | POA: Insufficient documentation

## 2015-01-02 DIAGNOSIS — I129 Hypertensive chronic kidney disease with stage 1 through stage 4 chronic kidney disease, or unspecified chronic kidney disease: Secondary | ICD-10-CM | POA: Diagnosis not present

## 2015-01-02 DIAGNOSIS — I2510 Atherosclerotic heart disease of native coronary artery without angina pectoris: Secondary | ICD-10-CM | POA: Diagnosis present

## 2015-01-02 DIAGNOSIS — I679 Cerebrovascular disease, unspecified: Secondary | ICD-10-CM | POA: Diagnosis not present

## 2015-01-02 DIAGNOSIS — E119 Type 2 diabetes mellitus without complications: Secondary | ICD-10-CM | POA: Diagnosis not present

## 2015-01-02 DIAGNOSIS — I482 Chronic atrial fibrillation: Secondary | ICD-10-CM | POA: Diagnosis not present

## 2015-01-02 DIAGNOSIS — I4891 Unspecified atrial fibrillation: Secondary | ICD-10-CM | POA: Insufficient documentation

## 2015-01-02 DIAGNOSIS — E785 Hyperlipidemia, unspecified: Secondary | ICD-10-CM | POA: Diagnosis not present

## 2015-01-02 DIAGNOSIS — K219 Gastro-esophageal reflux disease without esophagitis: Secondary | ICD-10-CM | POA: Diagnosis not present

## 2015-01-02 DIAGNOSIS — M6281 Muscle weakness (generalized): Secondary | ICD-10-CM | POA: Diagnosis not present

## 2015-01-02 DIAGNOSIS — I10 Essential (primary) hypertension: Secondary | ICD-10-CM | POA: Diagnosis not present

## 2015-01-02 DIAGNOSIS — I6330 Cerebral infarction due to thrombosis of unspecified cerebral artery: Secondary | ICD-10-CM

## 2015-01-02 HISTORY — DX: Atherosclerotic heart disease of native coronary artery without angina pectoris: I25.10

## 2015-01-02 LAB — COMPREHENSIVE METABOLIC PANEL
ALBUMIN: 3.5 g/dL (ref 3.5–5.0)
ALT: 9 U/L — AB (ref 14–54)
AST: 17 U/L (ref 15–41)
Alkaline Phosphatase: 81 U/L (ref 38–126)
Anion gap: 10 (ref 5–15)
BILIRUBIN TOTAL: 0.5 mg/dL (ref 0.3–1.2)
BUN: 26 mg/dL — ABNORMAL HIGH (ref 6–20)
CO2: 23 mmol/L (ref 22–32)
CREATININE: 1.25 mg/dL — AB (ref 0.44–1.00)
Calcium: 9.1 mg/dL (ref 8.9–10.3)
Chloride: 103 mmol/L (ref 101–111)
GFR calc Af Amer: 43 mL/min — ABNORMAL LOW (ref >60)
GFR calc non Af Amer: 37 mL/min — ABNORMAL LOW (ref >60)
Glucose, Bld: 191 mg/dL — ABNORMAL HIGH (ref 65–99)
Potassium: 4.1 mmol/L (ref 3.5–5.1)
Sodium: 136 mmol/L (ref 135–145)
Total Protein: 6.8 g/dL (ref 6.5–8.1)

## 2015-01-02 LAB — DIFFERENTIAL
BASOS ABS: 0 10*3/uL (ref 0.0–0.1)
BASOS PCT: 1 % (ref 0–1)
Eosinophils Absolute: 0.2 10*3/uL (ref 0.0–0.7)
Eosinophils Relative: 3 % (ref 0–5)
Lymphocytes Relative: 37 % (ref 12–46)
Lymphs Abs: 2.2 10*3/uL (ref 0.7–4.0)
MONOS PCT: 13 % — AB (ref 3–12)
Monocytes Absolute: 0.8 10*3/uL (ref 0.1–1.0)
NEUTROS ABS: 2.8 10*3/uL (ref 1.7–7.7)
NEUTROS PCT: 46 % (ref 43–77)

## 2015-01-02 LAB — I-STAT TROPONIN, ED: TROPONIN I, POC: 0 ng/mL (ref 0.00–0.08)

## 2015-01-02 LAB — PROTIME-INR
INR: 1.16 (ref 0.00–1.49)
Prothrombin Time: 14.9 seconds (ref 11.6–15.2)

## 2015-01-02 LAB — CBC
HCT: 33.9 % — ABNORMAL LOW (ref 36.0–46.0)
Hemoglobin: 11.1 g/dL — ABNORMAL LOW (ref 12.0–15.0)
MCH: 29.2 pg (ref 26.0–34.0)
MCHC: 32.7 g/dL (ref 30.0–36.0)
MCV: 89.2 fL (ref 78.0–100.0)
PLATELETS: 209 10*3/uL (ref 150–400)
RBC: 3.8 MIL/uL — ABNORMAL LOW (ref 3.87–5.11)
RDW: 12.3 % (ref 11.5–15.5)
WBC: 5.9 10*3/uL (ref 4.0–10.5)

## 2015-01-02 LAB — I-STAT CHEM 8, ED
BUN: 29 mg/dL — AB (ref 6–20)
CALCIUM ION: 1.19 mmol/L (ref 1.13–1.30)
Chloride: 102 mmol/L (ref 101–111)
Creatinine, Ser: 1.4 mg/dL — ABNORMAL HIGH (ref 0.44–1.00)
Glucose, Bld: 189 mg/dL — ABNORMAL HIGH (ref 65–99)
HCT: 35 % — ABNORMAL LOW (ref 36.0–46.0)
HEMOGLOBIN: 11.9 g/dL — AB (ref 12.0–15.0)
Potassium: 4.2 mmol/L (ref 3.5–5.1)
Sodium: 138 mmol/L (ref 135–145)
TCO2: 23 mmol/L (ref 0–100)

## 2015-01-02 LAB — APTT: aPTT: 32 seconds (ref 24–37)

## 2015-01-02 NOTE — Consult Note (Signed)
Admission H&P    Chief Complaint: Acute onset of left hemiparesis and slurred speech.  HPI: Veronica Short is an 81 y.o. female with a history of atrial fibrillation and sick sinus syndrome with pacemaker placement, diabetes mellitus, hypertension and hyperlipidemia, brought to the emergency room and code stroke status with acute onset of left facial droop and left facial weakness as well as slurred speech at 9 PM tonight. She has no history of stroke nor TIA. She's been taking Plavix for platelet therapy. CT scan of the head showed no acute intracranial abnormality. Patient's deficits rapidly improved starting around 10 PM. NIH stroke score at the time of this evaluation was 1, with slight left sensory changes persisting.  LSN: 9 PM on 01/02/2015 tPA Given: No: Rapidly resolving deficits mRankin:  Past Medical History  Diagnosis Date  . Hypertension   . Diabetes mellitus without complication   . High cholesterol   . Arthritis     Past Surgical History  Procedure Laterality Date  . Pacemaker insertion      Family history: Positive for coronary artery disease; negative for stroke.  Social History:  reports that she has never smoked. She has never used smokeless tobacco. Her alcohol and drug histories are not on file.  Allergies:  Allergies  Allergen Reactions  . Penicillins Hives    Medications: Patient's preadmission medications were reviewed by me.  ROS: History obtained from the patient  General ROS: negative for - chills, fatigue, fever, night sweats, weight gain or weight loss Psychological ROS: negative for - behavioral disorder, hallucinations, memory difficulties, mood swings or suicidal ideation Ophthalmic ROS: negative for - blurry vision, double vision, eye pain or loss of vision ENT ROS: negative for - epistaxis, nasal discharge, oral lesions, sore throat, tinnitus or vertigo Allergy and Immunology ROS: negative for - hives or itchy/watery eyes Hematological  and Lymphatic ROS: negative for - bleeding problems, bruising or swollen lymph nodes Endocrine ROS: negative for - galactorrhea, hair pattern changes, polydipsia/polyuria or temperature intolerance Respiratory ROS: negative for - cough, hemoptysis, shortness of breath or wheezing Cardiovascular ROS: negative for - chest pain, dyspnea on exertion, edema or irregular heartbeat Gastrointestinal ROS: negative for - abdominal pain, diarrhea, hematemesis, nausea/vomiting or stool incontinence Genito-Urinary ROS: negative for - dysuria, hematuria, incontinence or urinary frequency/urgency Musculoskeletal ROS: negative for - joint swelling or muscular weakness Neurological ROS: as noted in HPI Dermatological ROS: negative for rash and skin lesion changes  Physical Examination: Pulse 76, temperature 98.2 F (36.8 C), temperature source Oral, resp. rate 17, height  (1.499 m), SpO2 99 %.  HEENT-  Normocephalic, no lesions, without obvious abnormality.  Normal external eye and conjunctiva.  Normal TM's bilaterally.  Normal auditory canals and external ears. Normal external nose, mucus membranes and septum.  Normal pharynx. Neck supple with no masses, nodes, nodules or enlargement. Cardiovascular - irregularly irregular rhythm and S1, S2 normal Lungs - chest clear, no wheezing, rales, normal symmetric air entry Abdomen - soft, non-tender; bowel sounds normal; no masses,  no organomegaly Extremities - no joint deformities, effusion, or inflammation  Neurologic Examination: Mental Status: Alert, oriented, thought content appropriate.  Speech fluent without evidence of aphasia. Able to follow commands without difficulty. Cranial Nerves: II-Visual fields were normal. III/IV/VI-Pupils were equal and reacted normally to light. Extraocular movements were full and conjugate.    V/VII-no facial numbness and no facial weakness. VIII-normal. X-normal speech. XI: trapezius strength/neck flexion strength  normal bilaterally XII-midline tongue extension with normal strength. Motor:  5/5 bilaterally with normal tone and bulk Sensory: Reduced perception of tactile sensation over left extremities compared to right. Deep Tendon Reflexes: 2+ and symmetric. Plantars: Flexor bilaterally Cerebellar: Normal finger-to-nose testing. Carotid auscultation: Normal  Results for orders placed or performed during the hospital encounter of 01/02/15 (from the past 48 hour(s))  CBC     Status: Abnormal   Collection Time: 01/02/15 10:20 PM  Result Value Ref Range   WBC 5.9 4.0 - 10.5 K/uL   RBC 3.80 (L) 3.87 - 5.11 MIL/uL   Hemoglobin 11.1 (L) 12.0 - 15.0 g/dL   HCT 82.5 (L) 18.9 - 84.2 %   MCV 89.2 78.0 - 100.0 fL   MCH 29.2 26.0 - 34.0 pg   MCHC 32.7 30.0 - 36.0 g/dL   RDW 10.3 12.8 - 11.8 %   Platelets 209 150 - 400 K/uL  Differential     Status: Abnormal   Collection Time: 01/02/15 10:20 PM  Result Value Ref Range   Neutrophils Relative % 46 43 - 77 %   Neutro Abs 2.8 1.7 - 7.7 K/uL   Lymphocytes Relative 37 12 - 46 %   Lymphs Abs 2.2 0.7 - 4.0 K/uL   Monocytes Relative 13 (H) 3 - 12 %   Monocytes Absolute 0.8 0.1 - 1.0 K/uL   Eosinophils Relative 3 0 - 5 %   Eosinophils Absolute 0.2 0.0 - 0.7 K/uL   Basophils Relative 1 0 - 1 %   Basophils Absolute 0.0 0.0 - 0.1 K/uL  I-stat troponin, ED (not at [facility], [facility])     Status: None   Collection Time: 01/02/15 10:26 PM  Result Value Ref Range   Troponin i, poc 0.00 0.00 - 0.08 ng/mL   Comment 3            Comment: Due to the release kinetics of cTnI, a negative result within the first hours of the onset of symptoms does not rule out myocardial infarction with certainty. If myocardial infarction is still suspected, repeat the test at appropriate intervals.   I-Stat Chem 8, ED  (not at [facility], [facility])     Status: Abnormal   Collection Time: 01/02/15 10:27 PM  Result Value Ref Range   Sodium 138 135 - 145 mmol/L   Potassium 4.2 3.5 - 5.1 mmol/L    Chloride 102 101 - 111 mmol/L   BUN 29 (H) 6 - 20 mg/dL   Creatinine, Ser 8.67 (H) 0.44 - 1.00 mg/dL   Glucose, Bld 737 (H) 65 - 99 mg/dL   Calcium, Ion 3.66 8.15 - 1.30 mmol/L   TCO2 23 0 - 100 mmol/L   Hemoglobin 11.9 (L) 12.0 - 15.0 g/dL   HCT 94.7 (L) 07.6 - 15.1 %   Ct Head (brain) Wo Contrast  01/02/2015   CLINICAL DATA:  Code stroke. Left-sided weakness and slurred speech. Patient on Plavix.  EXAM: CT HEAD WITHOUT CONTRAST  TECHNIQUE: Contiguous axial images were obtained from the base of the skull through the vertex without intravenous contrast.  COMPARISON:  Brain CT 02/04/2012  FINDINGS: Periventricular and subcortical white matter hypodensity compatible with chronic small vessel ischemic changes. No evidence for acute cortically based infarct, intracranial hemorrhage, mass lesion or mass-effect. Orbits are unremarkable. Chronic opacification of the sphenoid sinus. Mucosal thickening involving the ethmoid air cells. Mastoid air cells are unremarkable. Calvarium is intact.  IMPRESSION: No acute intracranial process.  Critical Value/emergent results were called by telephone at the time of interpretation on 01/02/2015 at 10:38 pm  to Dr. Roseanne Reno, who verbally acknowledged these results.   Electronically Signed   By: Annia Belt M.D.   On: 01/02/2015 22:39    Assessment: 81 y.o. female with multiple risk factors for stroke presenting with probable transient ischemic attack. However, a small right subcortical MCA territory cerebral infarction cannot be ruled out at this point.  Stroke Risk Factors - atrial fibrillation, diabetes mellitus, hyperlipidemia and hypertension  Plan: 1. HgbA1c, fasting lipid panel 2. CT angiogram of head and neck with contrast 3. PT consult, OT consult, Speech consult 4. Echocardiogram 5. Prophylactic therapy-Antiplatelet med: Plavix  6. Risk factor modification 7. Telemetry monitoring  C.R. Roseanne Reno, MD [facility] [phone]  01/02/2015, 10:43  PM

## 2015-01-02 NOTE — Code Documentation (Signed)
Veronica Short is a 81yo wf presenting to [facility] via [facility].  Per report she had witnessed onset Lt facial droop and Lt wekaness at 2100.  For EMS she also had dysarthria.  On arrival to [facility] she had improved to only have a mild sensory deficit on the left.  NIH 1.  Plan admit to medicine.

## 2015-01-02 NOTE — ED Notes (Signed)
Per EMS family noticed left sided weakness in her arm and leg and facial drooping, last well known 2100, EMS was notified at 2130.  Vitals stable: 152/98, hr 97, resp 18, ABG 197.  Hx of Afib, quad bipass ('96), pacemaker& diabetes.  Left sided weakness began to improve at 2203.

## 2015-01-02 NOTE — ED Provider Notes (Signed)
CSN: [id number]     Arrival date & time 01/02/15  2213 History   First MD Initiated Contact with Patient 01/02/15 2222     Chief Complaint  Patient presents with  . Code Stroke    An emergency department physician performed an initial assessment on this suspected stroke patient at 2213. (Consider location/radiation/quality/duration/timing/severity/associated sxs/prior Treatment) HPI Patient presents approximately 2 hours after developing speech difficulty, left-sided weakness. Report is per the patient and nursing home staff, via EMS providers. Per report the patient was generally well until onset of symptoms.  Symptoms were initially profound, with unintelligible speech.  On arrival the patient had substantially improved symptoms, was awake, alert, answering questions appropriately. She described generalized weakness, but no focal weakness.   Past Medical History  Diagnosis Date  . Hypertension   . Diabetes mellitus without complication   . High cholesterol   . Arthritis    Past Surgical History  Procedure Laterality Date  . Pacemaker insertion     No family history on file. History  Substance Use Topics  . Smoking status: Never Smoker   . Smokeless tobacco: Never Used  . Alcohol Use: Not on file   OB History    No data available     Review of Systems  Constitutional:       Per HPI, otherwise negative  HENT:       Per HPI, otherwise negative  Respiratory:       Per HPI, otherwise negative  Cardiovascular:       Per HPI, otherwise negative  Gastrointestinal: Negative for vomiting.  Endocrine:       Negative aside from HPI  Genitourinary:       Neg aside from HPI   Musculoskeletal:       Per HPI, otherwise negative  Skin: Negative.   Neurological: Positive for speech difficulty and weakness. Negative for syncope.      Allergies  Penicillins  Home Medications   Prior to Admission medications   Medication Sig Start Date End Date Taking? Authorizing  Provider  atenolol (TENORMIN) 100 MG tablet  10/14/13   Historical Provider, MD  atorvastatin (LIPITOR) 20 MG tablet  11/01/13   Historical Provider, MD  citalopram (CELEXA) 20 MG tablet  10/18/13   Historical Provider, MD  clopidogrel (PLAVIX) 75 MG tablet  11/01/13   Historical Provider, MD  diltiazem (CARDIZEM CD) 180 MG 24 hr capsule  11/01/13   Historical Provider, MD  lisinopril-hydrochlorothiazide (PRINZIDE,ZESTORETIC) 20-12.5 MG per tablet  11/01/13   Historical Provider, MD  metFORMIN (GLUCOPHAGE-XR) 500 MG 24 hr tablet  11/01/13   Historical Provider, MD  omeprazole (PRILOSEC) 20 MG capsule  09/19/13   Historical Provider, MD   Pulse 76  Temp(Src) 98.2 F (36.8 C) (Oral)  Resp 17  Ht  (1.499 m)  Wt 138 lb 3.7 oz (62.7 kg)  BMI 27.90 kg/m2  SpO2 99% Physical Exam  Constitutional: She is oriented to person, place, and time. She appears well-developed and well-nourished. No distress.  HENT:  Head: Normocephalic and atraumatic.  Eyes: Conjunctivae and EOM are normal.  Cardiovascular: Normal rate and regular rhythm.   Pulmonary/Chest: Effort normal and breath sounds normal. No stridor. No respiratory distress.  Abdominal: She exhibits no distension.  Musculoskeletal: She exhibits no edema.  Neurological: She is alert and oriented to person, place, and time. No cranial nerve deficit.  Patient was symmetric upper and lower extremity strength, no tremor, no discoordination, though mild slow response to finger-nose, bilaterally. Speech  is clear.   Skin: Skin is warm and dry.  Psychiatric: She has a normal mood and affect.  Nursing note and vitals reviewed.  NIH 0 ED Course  Procedures (including critical care time) Labs Review Labs Reviewed  CBC - Abnormal; Notable for the following:    RBC 3.80 (*)    Hemoglobin 11.1 (*)    HCT 33.9 (*)    All other components within normal limits  DIFFERENTIAL - Abnormal; Notable for the following:    Monocytes Relative 13 (*)    All  other components within normal limits  I-STAT CHEM 8, ED - Abnormal; Notable for the following:    BUN 29 (*)    Creatinine, Ser 1.40 (*)    Glucose, Bld 189 (*)    Hemoglobin 11.9 (*)    HCT 35.0 (*)    All other components within normal limits  PROTIME-INR  APTT  COMPREHENSIVE METABOLIC PANEL  I-STAT TROPOININ, ED  CBG MONITORING, ED    Imaging Review Ct Head (brain) Wo Contrast  01/02/2015   CLINICAL DATA:  Code stroke. Left-sided weakness and slurred speech. Patient on Plavix.  EXAM: CT HEAD WITHOUT CONTRAST  TECHNIQUE: Contiguous axial images were obtained from the base of the skull through the vertex without intravenous contrast.  COMPARISON:  Brain CT 02/04/2012  FINDINGS: Periventricular and subcortical white matter hypodensity compatible with chronic small vessel ischemic changes. No evidence for acute cortically based infarct, intracranial hemorrhage, mass lesion or mass-effect. Orbits are unremarkable. Chronic opacification of the sphenoid sinus. Mucosal thickening involving the ethmoid air cells. Mastoid air cells are unremarkable. Calvarium is intact.  IMPRESSION: No acute intracranial process.  Critical Value/emergent results were called by telephone at the time of interpretation on 01/02/2015 at 10:38 pm to Dr. Roseanne Reno, who verbally acknowledged these results.   Electronically Signed   By: Annia Belt M.D.   On: 01/02/2015 22:39     EKG Interpretation   Date/Time:  Wednesday January 02 2015 23:05:06 EDT Ventricular Rate:  72 PR Interval:    QRS Duration: 104 QT Interval:  388 QTC Calculation: 425 R Axis:   -5 Text Interpretation:  Atrial fibrillation Nonspecific repol abnormality,  diffuse leads Atrial fibrillation Abnormal ekg Confirmed by Gerhard Munch  MD [phone]) on 01/02/2015 11:23:30 PM     Patient's case was conducted in coordination with neurology, with I discussed the patient at length.  MDM  Patient presents after the sudden development of speech  difficulty, left facial droop, left-sided weakness. Patient has history of prior stroke, though tonight the symptoms resolved almost entirely prior to my evaluation. Initial evaluation was reassuring. However, with her history, risk factors, patient required admission for further evaluation and management.  Gerhard Munch, MD 01/02/15 2325

## 2015-01-02 NOTE — ED Notes (Signed)
Dr. Jenkins at bedside. 

## 2015-01-03 ENCOUNTER — Observation Stay (HOSPITAL_COMMUNITY): Payer: Commercial Managed Care - HMO

## 2015-01-03 ENCOUNTER — Encounter (HOSPITAL_COMMUNITY): Payer: Self-pay | Admitting: Internal Medicine

## 2015-01-03 ENCOUNTER — Observation Stay (HOSPITAL_BASED_OUTPATIENT_CLINIC_OR_DEPARTMENT_OTHER): Payer: Commercial Managed Care - HMO

## 2015-01-03 DIAGNOSIS — I4891 Unspecified atrial fibrillation: Secondary | ICD-10-CM | POA: Diagnosis not present

## 2015-01-03 DIAGNOSIS — I6330 Cerebral infarction due to thrombosis of unspecified cerebral artery: Secondary | ICD-10-CM | POA: Diagnosis not present

## 2015-01-03 DIAGNOSIS — G459 Transient cerebral ischemic attack, unspecified: Secondary | ICD-10-CM | POA: Diagnosis not present

## 2015-01-03 DIAGNOSIS — E7800 Pure hypercholesterolemia, unspecified: Secondary | ICD-10-CM | POA: Diagnosis present

## 2015-01-03 DIAGNOSIS — E119 Type 2 diabetes mellitus without complications: Secondary | ICD-10-CM

## 2015-01-03 DIAGNOSIS — I10 Essential (primary) hypertension: Secondary | ICD-10-CM | POA: Diagnosis not present

## 2015-01-03 DIAGNOSIS — G458 Other transient cerebral ischemic attacks and related syndromes: Secondary | ICD-10-CM | POA: Diagnosis not present

## 2015-01-03 DIAGNOSIS — I495 Sick sinus syndrome: Secondary | ICD-10-CM | POA: Diagnosis not present

## 2015-01-03 DIAGNOSIS — R531 Weakness: Secondary | ICD-10-CM | POA: Diagnosis not present

## 2015-01-03 DIAGNOSIS — I671 Cerebral aneurysm, nonruptured: Secondary | ICD-10-CM | POA: Diagnosis not present

## 2015-01-03 DIAGNOSIS — N183 Chronic kidney disease, stage 3 (moderate): Secondary | ICD-10-CM | POA: Diagnosis not present

## 2015-01-03 DIAGNOSIS — K219 Gastro-esophageal reflux disease without esophagitis: Secondary | ICD-10-CM | POA: Diagnosis not present

## 2015-01-03 DIAGNOSIS — I2510 Atherosclerotic heart disease of native coronary artery without angina pectoris: Secondary | ICD-10-CM

## 2015-01-03 DIAGNOSIS — I639 Cerebral infarction, unspecified: Secondary | ICD-10-CM | POA: Diagnosis not present

## 2015-01-03 DIAGNOSIS — E785 Hyperlipidemia, unspecified: Secondary | ICD-10-CM | POA: Diagnosis not present

## 2015-01-03 DIAGNOSIS — I129 Hypertensive chronic kidney disease with stage 1 through stage 4 chronic kidney disease, or unspecified chronic kidney disease: Secondary | ICD-10-CM | POA: Diagnosis not present

## 2015-01-03 DIAGNOSIS — I482 Chronic atrial fibrillation: Secondary | ICD-10-CM | POA: Diagnosis not present

## 2015-01-03 DIAGNOSIS — R479 Unspecified speech disturbances: Secondary | ICD-10-CM

## 2015-01-03 LAB — LIPID PANEL
CHOL/HDL RATIO: 4.9 ratio
CHOLESTEROL: 215 mg/dL — AB (ref 0–200)
HDL: 44 mg/dL (ref >40)
LDL Cholesterol: 137 mg/dL — ABNORMAL HIGH (ref 0–99)
Triglycerides: 171 mg/dL — ABNORMAL HIGH (ref <150)
VLDL: 34 mg/dL (ref 0–40)

## 2015-01-03 LAB — GLUCOSE, CAPILLARY
Glucose-Capillary: 135 mg/dL — ABNORMAL HIGH (ref 65–99)
Glucose-Capillary: 160 mg/dL — ABNORMAL HIGH (ref 65–99)
Glucose-Capillary: 150 mg/dL — ABNORMAL HIGH (ref 65–99)

## 2015-01-03 MED ORDER — SODIUM CHLORIDE 0.9 % IV SOLN: INTRAVENOUS | Status: DC

## 2015-01-03 MED ORDER — SODIUM CHLORIDE 0.9 % IV SOLN
INTRAVENOUS | Status: DC
  Administered 2015-01-03: 02:00:00 via INTRAVENOUS

## 2015-01-03 MED ORDER — OLOPATADINE HCL 0.1 % OP SOLN
1.0000 [drp] | Freq: Two times a day (BID) | OPHTHALMIC | Status: DC
  Administered 2015-01-03 – 2015-01-04 (×3): 1 [drp] via OPHTHALMIC
  Filled 2015-01-03: qty 5

## 2015-01-03 MED ORDER — ATENOLOL 100 MG PO TABS
100.0000 mg | ORAL_TABLET | Freq: Every day | ORAL | Status: DC
  Administered 2015-01-03 – 2015-01-04 (×2): 100 mg via ORAL
  Filled 2015-01-03 (×2): qty 1

## 2015-01-03 MED ORDER — ACETAMINOPHEN 325 MG PO TABS
650.0000 mg | ORAL_TABLET | Freq: Four times a day (QID) | ORAL | Status: DC | PRN
  Administered 2015-01-03: 650 mg via ORAL
  Filled 2015-01-03: qty 2

## 2015-01-03 MED ORDER — CALCIUM CARBONATE-VITAMIN D 500-200 MG-UNIT PO TABS
1.0000 | ORAL_TABLET | Freq: Two times a day (BID) | ORAL | Status: DC
  Administered 2015-01-03 – 2015-01-04 (×3): 1 via ORAL
  Filled 2015-01-03 (×3): qty 1

## 2015-01-03 MED ORDER — INSULIN ASPART 100 UNIT/ML ~~LOC~~ SOLN
0.0000 [IU] | SUBCUTANEOUS | Status: DC
  Administered 2015-01-03: 2 [IU] via SUBCUTANEOUS
  Administered 2015-01-03 – 2015-01-04 (×2): 1 [IU] via SUBCUTANEOUS

## 2015-01-03 MED ORDER — IOHEXOL 350 MG/ML SOLN
50.0000 mL | Freq: Once | INTRAVENOUS | Status: AC | PRN
  Administered 2015-01-03: 50 mL via INTRAVENOUS

## 2015-01-03 MED ORDER — ATORVASTATIN CALCIUM 40 MG PO TABS
40.0000 mg | ORAL_TABLET | Freq: Every day | ORAL | Status: DC
  Administered 2015-01-03: 40 mg via ORAL
  Filled 2015-01-03: qty 1

## 2015-01-03 MED ORDER — STROKE: EARLY STAGES OF RECOVERY BOOK
Freq: Once | Status: AC
  Administered 2015-01-03: 03:00:00

## 2015-01-03 MED ORDER — ATORVASTATIN CALCIUM 10 MG PO TABS
20.0000 mg | ORAL_TABLET | Freq: Every day | ORAL | Status: DC
  Administered 2015-01-03: 20 mg via ORAL
  Filled 2015-01-03 (×2): qty 2

## 2015-01-03 MED ORDER — ENOXAPARIN SODIUM 30 MG/0.3ML ~~LOC~~ SOLN
30.0000 mg | SUBCUTANEOUS | Status: DC
  Administered 2015-01-03: 30 mg via SUBCUTANEOUS
  Filled 2015-01-03: qty 0.3

## 2015-01-03 MED ORDER — HYDRALAZINE HCL 20 MG/ML IJ SOLN
2.0000 mg | Freq: Four times a day (QID) | INTRAMUSCULAR | Status: DC | PRN
  Administered 2015-01-04: 2 mg via INTRAVENOUS
  Filled 2015-01-03: qty 1

## 2015-01-03 MED ORDER — DILTIAZEM HCL ER COATED BEADS 180 MG PO CP24
180.0000 mg | ORAL_CAPSULE | Freq: Every day | ORAL | Status: DC
Start: 2015-01-03 — End: 2015-01-04
  Administered 2015-01-03 – 2015-01-04 (×2): 180 mg via ORAL
  Filled 2015-01-03 (×2): qty 1

## 2015-01-03 MED ORDER — PANTOPRAZOLE SODIUM 40 MG PO TBEC
40.0000 mg | DELAYED_RELEASE_TABLET | Freq: Every day | ORAL | Status: DC
  Administered 2015-01-03 – 2015-01-04 (×2): 40 mg via ORAL
  Filled 2015-01-03 (×2): qty 1

## 2015-01-03 MED ORDER — ASPIRIN EC 325 MG PO TBEC
325.0000 mg | DELAYED_RELEASE_TABLET | Freq: Every day | ORAL | Status: DC
  Administered 2015-01-03: 325 mg via ORAL
  Filled 2015-01-03: qty 1

## 2015-01-03 MED ORDER — CLOPIDOGREL BISULFATE 75 MG PO TABS
75.0000 mg | ORAL_TABLET | Freq: Every day | ORAL | Status: DC
  Administered 2015-01-03: 75 mg via ORAL
  Filled 2015-01-03: qty 1

## 2015-01-03 MED ORDER — CITALOPRAM HYDROBROMIDE 10 MG PO TABS
20.0000 mg | ORAL_TABLET | Freq: Every day | ORAL | Status: DC
  Administered 2015-01-03 – 2015-01-04 (×2): 20 mg via ORAL
  Filled 2015-01-03 (×2): qty 2

## 2015-01-03 MED ORDER — NITROGLYCERIN 0.4 MG SL SUBL: 0.4000 mg | SUBLINGUAL_TABLET | SUBLINGUAL | Status: DC | PRN

## 2015-01-03 MED ORDER — FLUTICASONE PROPIONATE 50 MCG/ACT NA SUSP
2.0000 | Freq: Every day | NASAL | Status: DC
  Administered 2015-01-03 – 2015-01-04 (×2): 2 via NASAL
  Filled 2015-01-03: qty 16

## 2015-01-03 MED ORDER — CHOLECALCIFEROL 10 MCG (400 UNIT) PO TABS
400.0000 [IU] | ORAL_TABLET | Freq: Every day | ORAL | Status: DC
  Administered 2015-01-03 – 2015-01-04 (×2): 400 [IU] via ORAL
  Filled 2015-01-03 (×2): qty 1

## 2015-01-03 MED ORDER — APIXABAN 2.5 MG PO TABS
2.5000 mg | ORAL_TABLET | Freq: Two times a day (BID) | ORAL | Status: DC
  Administered 2015-01-03: 2.5 mg via ORAL
  Filled 2015-01-03: qty 1

## 2015-01-03 NOTE — Progress Notes (Signed)
  Echocardiogram 2D Echocardiogram has been performed.  Cathie Beams 01/03/2015, 12:21 PM

## 2015-01-03 NOTE — Progress Notes (Signed)
VASCULAR LAB PRELIMINARY  PRELIMINARY  PRELIMINARY  PRELIMINARY  Carotid duplex  completed.    Preliminary report:  Bilateral:  1-39% ICA stenosis.  Vertebral artery flow is antegrade.      Tempie Gibeault, RVT 01/03/2015, 12:39 PM

## 2015-01-03 NOTE — H&P (Signed)
[facility] Admission History and Physical       Veronica Short ZOX:[id number] DOB: 1923-08-18 DOA: 01/02/2015  Referring physician: EDP PCP: Veronica Box, MD  Specialists:   Chief Complaint: Right Sided Facial Droop, and Left Sided Weakness  HPI: Veronica Short is a 81 y.o. female with a history of CAD, S/P Pacemaker due to Sick Sinus Syndrome, DM2, HTN, and Hyperlipidemia who presents to the ED with complaints of sudden onset of Right sided facial droop along with slurred speech and Left sided weakness around 8 pm.  Her symptoms were preceded by dizziness and a right sided headache.   She was seen in the ED as  A Code Stroke and a Ct scan of the head was perfrmed and was negative for acute findings.   Her symptoms were improving while she was in the ED.     Review of Systems:  Constitutional: No Weight Loss, No Weight Gain, Night Sweats, Fevers, Chills, +Dizziness, Light Headedness, Fatigue, or Generalized Weakness HEENT: +Right sided  Headache, Difficulty Swallowing,Tooth/Dental Problems,Sore Throat,  No Sneezing, Rhinitis, Ear Ache, Nasal Congestion, or Post Nasal Drip,  Cardio-vascular:  No Chest pain, Orthopnea, PND, Edema in Lower Extremities, Anasarca, Dizziness, Palpitations  Resp: No Dyspnea, No DOE, No Productive Cough, No Non-Productive Cough, No Hemoptysis, No Wheezing.    GI: No Heartburn, Indigestion, Abdominal Pain, Nausea, Vomiting, Diarrhea, Constipation, Hematemesis, Hematochezia, Melena, Change in Bowel Habits,  Loss of Appetite  GU: No Dysuria, No Change in Color of Urine, No Urgency or Urinary Frequency, No Flank pain.  Musculoskeletal: No Joint Pain or Swelling, No Decreased Range of Motion, No Back Pain.  Neurologic: No Syncope, No Seizures, +Right Sided Facial Droop, +Left sided Weakness, Paresthesia, Vision Disturbance or Loss, No Diplopia, No Vertigo, Difficulty Walking,  Skin: No Rash or Lesions. Psych: No Change in Mood or Affect, No Depression or  Anxiety, No Memory loss, No Confusion, or Hallucinations   Past Medical History  Diagnosis Date  . Hypertension   . Diabetes mellitus without complication   . High cholesterol   . Arthritis      Past Surgical History  Procedure Laterality Date  . Pacemaker insertion        Prior to Admission medications   Medication Sig Start Date End Date Taking? Authorizing Provider  acetaminophen (TYLENOL) 500 MG tablet Take 1,000 mg by mouth every 6 (six) hours as needed for mild pain.   Yes Historical Provider, MD  aspirin EC 325 MG tablet Take 325 mg by mouth daily.   Yes Historical Provider, MD  atenolol (TENORMIN) 100 MG tablet Take 100 mg by mouth daily.  10/14/13  Yes Historical Provider, MD  atorvastatin (LIPITOR) 20 MG tablet Take 20 mg by mouth at bedtime.  11/01/13  Yes Historical Provider, MD  CALCIUM PO Take 2 tablets by mouth 2 (two) times daily.   Yes Historical Provider, MD  Cholecalciferol (VITAMIN D PO) Take 1 tablet by mouth every morning.   Yes Historical Provider, MD  citalopram (CELEXA) 20 MG tablet Take 20 mg by mouth daily.  10/18/13  Yes Historical Provider, MD  clopidogrel (PLAVIX) 75 MG tablet Take 75 mg by mouth daily.  11/01/13  Yes Historical Provider, MD  diltiazem (CARDIZEM CD) 180 MG 24 hr capsule Take 180 mg by mouth daily.  11/01/13  Yes Historical Provider, MD  fluticasone (FLONASE) 50 MCG/ACT nasal spray Place 2 sprays into both nostrils daily.   Yes Historical Provider, MD  lisinopril-hydrochlorothiazide (PRINZIDE,ZESTORETIC) 20-12.5 MG  per tablet Take 1 tablet by mouth daily.  11/01/13  Yes Historical Provider, MD  metFORMIN (GLUCOPHAGE-XR) 500 MG 24 hr tablet Take 500 mg by mouth daily with breakfast.  11/01/13  Yes Historical Provider, MD  nitroGLYCERIN (NITROSTAT) 0.4 MG SL tablet Place 0.4 mg under the tongue every 5 (five) minutes as needed for chest pain.   Yes Historical Provider, MD  Olopatadine HCl (PATADAY) 0.2 % SOLN Place 2 drops into both eyes every  morning.   Yes Historical Provider, MD  omeprazole (PRILOSEC) 20 MG capsule Take 20 mg by mouth every morning.  09/19/13  Yes Historical Provider, MD  traMADol (ULTRAM) 50 MG tablet Take 50 mg by mouth every 6 (six) hours as needed for moderate pain.   Yes Historical Provider, MD  VITAMIN E PO Take 1 tablet by mouth every morning.   Yes Historical Provider, MD     Allergies  Allergen Reactions  . Penicillins Hives    Social History:  Walks with a Dan Humphreys   reports that she has never smoked. She has never used smokeless tobacco. Her alcohol and drug histories are not on file.      No family history on file.     Physical Exam:  GEN:  Pleasant Elderly Well Nourished and Well Developed  81 y.o. female examined and in no acute distress; cooperative with exam Filed Vitals:   01/03/15 0139 01/03/15 0322 01/03/15 0521 01/03/15 0945  BP: 176/73 170/72 161/69 178/85  Pulse: 74 81 85 72  Temp: 98.3 F (36.8 C) 97.8 F (36.6 C) 97.8 F (36.6 C) 97.8 F (36.6 C)  TempSrc: Oral Oral Oral Oral  Resp: Height:  (1.473 m)     Weight: 68.7 kg (151 lb 7.3 oz)     SpO2: 95% 97% 95% 98%   Blood pressure 178/85, pulse 72, temperature 97.8 F (36.6 C), temperature source Oral, resp. rate 18, height  (1.473 m), weight 68.7 kg (151 lb 7.3 oz), SpO2 98 %. PSYCH: She is alert and oriented x4; does not appear anxious does not appear depressed; affect is normal HEENT: Normocephalic and Atraumatic, Mucous membranes pink; PERRLA; EOM intact; Fundi:  Benign;  No scleral icterus, Nares: Patent, Oropharynx: Clear, Edentulous,    Neck:  FROM, No Cervical Lymphadenopathy nor Thyromegaly or Carotid Bruit; No JVD; Breasts:: Not examined CHEST WALL: No tenderness CHEST: Normal respiration, clear to auscultation bilaterally HEART: Regular rate and rhythm; no murmurs rubs or gallops BACK: No kyphosis or scoliosis; No CVA tenderness ABDOMEN: Positive Bowel Sounds, Soft Non-Tender, No  Rebound or Guarding; No Masses, No Organomegaly. Rectal Exam: Not done EXTREMITIES: No Cyanosis, Clubbing, or Edema; No Ulcerations. Genitalia: not examined PULSES: 2+ and symmetric SKIN: Normal hydration no rash or ulceration  CNS:  Alert and Oriented x 4,  Mental Status:  Alert, Oriented, Thought Content Appropriate. Speech Fluent without evidence of Aphasia. Able to follow 3 step commands without difficulty.  In No obvious pain.   Cranial Nerves:  II: Discs flat bilaterally; Visual fields Intact,. Pupils reactive  ( surgical changes Present).    III,IV, VI: Extra-ocular motions intact bilaterally    V,VII: +Right sided Facial Droop,  smile asymmetric, facial light touch sensation normal bilaterally    VIII: hearing intact or decreasesd bilaterally    IX,X: gag reflex present    XI: bilateral shoulder shrug    XII: midline tongue extension   Motor:  Right:  Upper extremity 5/5  Left:  Upper extremity 5-/5     Right:  Lower extremity 5/5    Left:  Lower extremity 5-/5     Tone and Bulk:  normal tone throughout; no atrophy noted   Sensory:  Pinprick and light touch intact throughout, bilaterally   Deep Tendon Reflexes: 2+ and symmetric throughout   Plantars/ Babinski:  Right: equivocal  Left: equivocal     Cerebellar:  Finger to nose without difficulty.   Gait: deferred    Vascular: pulses palpable throughout    Labs on Admission:  Basic Metabolic Panel:  Recent Labs Lab 01/02/15 2220 01/02/15 2227  NA 136 138  K 4.1 4.2  CL 103 102  CO2 23  --   GLUCOSE 191* 189*  BUN 26* 29*  CREATININE 1.25* 1.40*  CALCIUM 9.1  --    Liver Function Tests:  Recent Labs Lab 01/02/15 2220  AST 17  ALT 9*  ALKPHOS 81  BILITOT 0.5  PROT 6.8  ALBUMIN 3.5   No results for input(s): LIPASE, AMYLASE in the last 168 hours. No results for input(s): AMMONIA in the last 168 hours. CBC:  Recent Labs Lab 01/02/15 2220 01/02/15 2227  WBC 5.9  --   NEUTROABS 2.8  --     HGB 11.1* 11.9*  HCT 33.9* 35.0*  MCV 89.2  --   PLT 209  --    Cardiac Enzymes: No results for input(s): CKTOTAL, CKMB, CKMBINDEX, TROPONINI in the last 168 hours.  BNP (last 3 results) No results for input(s): BNP in the last 8760 hours.  ProBNP (last 3 results) No results for input(s): PROBNP in the last 8760 hours.  CBG:  Recent Labs Lab 01/03/15 0323  GLUCAP 135*    Radiological Exams on Admission: Ct Head (brain) Wo Contrast  01/02/2015   CLINICAL DATA:  Code stroke. Left-sided weakness and slurred speech. Patient on Plavix.  EXAM: CT HEAD WITHOUT CONTRAST  TECHNIQUE: Contiguous axial images were obtained from the base of the skull through the vertex without intravenous contrast.  COMPARISON:  Brain CT 02/04/2012  FINDINGS: Periventricular and subcortical white matter hypodensity compatible with chronic small vessel ischemic changes. No evidence for acute cortically based infarct, intracranial hemorrhage, mass lesion or mass-effect. Orbits are unremarkable. Chronic opacification of the sphenoid sinus. Mucosal thickening involving the ethmoid air cells. Mastoid air cells are unremarkable. Calvarium is intact.  IMPRESSION: No acute intracranial process.  Critical Value/emergent results were called by telephone at the time of interpretation on 01/02/2015 at 10:38 pm to Dr. Roseanne Reno, who verbally acknowledged these results.   Electronically Signed   By: Annia Belt M.D.   On: 01/02/2015 22:39     EKG: Independently reviewed. Atrial fibrillation rate 71    Assessment/Plan:      81 y.o. female with Active Problems:   1.   TIA (transient ischemic attack)   TIA Workup inititated   Neurology Dr Roseanne Reno saw in ED   Neuro Checks   Risk Stratification Check Fasting lipids, and HbA1C     2.   Atrial Fibrillation   On Atenolol, and Diltiazem Rx        3.   CAD (coronary artery disease)   Continue Atorvastatin , Plavix,      4.   Diabetes mellitus without complication   Hold  Metformin Rx   SSI coverage PRN     5.   Hypertension   Continue Atenolol, and Diltiazem Rx      6.   High cholesterol  Continue Atorvastatin Rx   Check Fasting Lipids     7.   DVT Prophylaxis    Lovenox           Code Status:     FULL CODE      Family Communication:   Family at Bedside       Disposition Plan:    Observation Status       Time spent:   80 Minutes      Ron Parker [facility] Pager [phone]   If 7AM -7PM Please Contact the Day Rounding Team MD for [facility]  If 7PM-7AM, Please Contact Night-Floor Coverage  [email or web address] Password [email or web address] 01/03/2015, 9:50 AM     ADDENDUM:   Patient was seen and examined on 01/03/2015

## 2015-01-03 NOTE — Progress Notes (Signed)
Patient arrived to 4N32 AAOx4. She was able to walk to the bathroom with one assist and a walker. Pt has a headache at this time. Family is at the bedside. Vitals taken and tele placed. Will continue to monitor. Araly Kaas, [place] Scrape, RN

## 2015-01-03 NOTE — Progress Notes (Signed)
ANTICOAGULATION CONSULT NOTE - Initial Consult  Pharmacy Consult for Apixaban Indication: atrial fibrillation  Allergies  Allergen Reactions  . Penicillins Hives    Patient Measurements: Height: 4\' 10"  (147.3 cm) Weight: 151 lb 7.3 oz (68.7 kg) IBW/kg (Calculated) : 40.9 Heparin Dosing Weight:   Vital Signs: Temp: 98.6 F (37 C) (06/23 1448) Temp Source: Oral (06/23 1448) BP: 167/88 mmHg (06/23 1448) Pulse Rate: 74 (06/23 1448)  Labs:  Recent Labs  01/02/15 2220 01/02/15 2227  HGB 11.1* 11.9*  HCT 33.9* 35.0*  PLT 209  --   APTT 32  --   LABPROT 14.9  --   INR 1.16  --   CREATININE 1.25* 1.40*    Estimated Creatinine Clearance: 22.8 mL/min (by C-G formula based on Cr of 1.4).   Medical History: Past Medical History  Diagnosis Date  . Hypertension   . Diabetes mellitus without complication   . High cholesterol   . Arthritis   . CAD (coronary artery disease)     Medications:  Scheduled:  . aspirin EC  325 mg Oral Daily  . atenolol  100 mg Oral Daily  . atorvastatin  20 mg Oral QHS  . calcium-vitamin D  1 tablet Oral BID WC  . cholecalciferol  400 Units Oral Daily  . citalopram  20 mg Oral Daily  . clopidogrel  75 mg Oral Daily  . diltiazem  180 mg Oral Daily  . enoxaparin (LOVENOX) injection  30 mg Subcutaneous Q24H  . fluticasone  2 spray Each Nare Daily  . insulin aspart  0-9 Units Subcutaneous 6 times per day  . olopatadine  1 drop Both Eyes BID  . pantoprazole  40 mg Oral Daily    Assessment: 81yo, 68kg female presenting with R-facial droop and L-sided weakness last PM.  She received no tPA and is currently on Lovenox for VTE px and full dose ASA.  Noted hx of falls as reason she has not been on anticoagulation pta.  She had been on Plavix.  Due to CrCl < 30 and advanced age, will adjust Apixaban dosing but f/u Cr in AM  PMH:  HTN, HLD, DM, AFib, SSS- PPM, arthritis.  CT head (-)bleed. Cr 1.4, Hg 11.9, pltc wnl  Goal of Therapy:   prevention of stroke in setting of AFib Monitor platelets by anticoagulation protocol: Yes   Plan:  Apixaban 2.5mg  bid D/C Lovenox Check Cr in AM  Marisue Humble, PharmD Clinical Pharmacist [facility]

## 2015-01-03 NOTE — Progress Notes (Signed)
STROKE TEAM PROGRESS NOTE   HISTORY Veronica Short is an 81 y.o. female with a history of atrial fibrillation and sick sinus syndrome with pacemaker placement, diabetes mellitus, hypertension and hyperlipidemia, brought to the emergency room and code stroke status with acute onset of left facial droop and left facial weakness as well as slurred speech at 9 PM tonight. She has no history of stroke nor TIA. She's been taking Plavix for platelet therapy. CT scan of the head showed no acute intracranial abnormality. Patient's deficits rapidly improved starting around 10 PM. NIH stroke score at the time of this evaluation was 1, with slight left sensory changes persisting.  LSN: 9 PM on 01/02/2015 tPA Given: No: Rapidly resolving deficits mRankin:   SUBJECTIVE (INTERVAL HISTORY) Multiple family members present. The patient has a history of atrial fibrillation but has not been anticoagulated secondary to frequent falls due to severe arthritis and neuropathy. Dr. Pearlean Brownie recommended Eliquis. The patient states that her speech has improved although she is still having problems with her left arm.   OBJECTIVE Temp:  [97.8 F (36.6 C)-98.3 F (36.8 C)] 97.8 F (36.6 C) (06/23 0521) Pulse Rate:  [50-85] 85 (06/23 0521) Cardiac Rhythm:  [-] Atrial fibrillation (06/23 0140) Resp:  [16-22] 18 (06/23 0521) BP: (151-176)/(64-121) 161/69 mmHg (06/23 0521) SpO2:  [95 %-99 %] 95 % (06/23 0521) Weight:  [62.7 kg (138 lb 3.7 oz)-68.7 kg (151 lb 7.3 oz)] 68.7 kg (151 lb 7.3 oz) (06/23 0139)   Recent Labs Lab 01/03/15 0323  GLUCAP 135*    Recent Labs Lab 01/02/15 2220 01/02/15 2227  NA 136 138  K 4.1 4.2  CL 103 102  CO2 23  --   GLUCOSE 191* 189*  BUN 26* 29*  CREATININE 1.25* 1.40*  CALCIUM 9.1  --     Recent Labs Lab 01/02/15 2220  AST 17  ALT 9*  ALKPHOS 81  BILITOT 0.5  PROT 6.8  ALBUMIN 3.5    Recent Labs Lab 01/02/15 2220 01/02/15 2227  WBC 5.9  --   NEUTROABS 2.8  --    HGB 11.1* 11.9*  HCT 33.9* 35.0*  MCV 89.2  --   PLT 209  --    No results for input(s): CKTOTAL, CKMB, CKMBINDEX, TROPONINI in the last 168 hours.  Recent Labs  01/02/15 2220  LABPROT 14.9  INR 1.16   No results for input(s): COLORURINE, LABSPEC, PHURINE, GLUCOSEU, HGBUR, BILIRUBINUR, KETONESUR, PROTEINUR, UROBILINOGEN, NITRITE, LEUKOCYTESUR in the last 72 hours.  Invalid input(s): APPERANCEUR  No results found for: CHOL, TRIG, HDL, CHOLHDL, VLDL, LDLCALC No results found for: HGBA1C No results found for: LABOPIA, COCAINSCRNUR, LABBENZ, AMPHETMU, THCU, LABBARB  No results for input(s): ETH in the last 168 hours.    Imaging    Ct Head (brain) Wo Contrast 01/02/2015    No acute intracranial process.     CTA of the Head - pending     PHYSICAL EXAM Pleasant elderly Caucasian lady not in distress. . Afebrile. Head is nontraumatic. Neck is supple without bruit.    Cardiac exam no murmur or gallop. Lungs are clear to auscultation. Distal pulses are well felt.  Neurological Exam : Awake alert oriented x 3 normal speech and language. Mild left lower face asymmetry. Tongue midline. No drift. Mild diminished fine finger movements on left. Orbits right over left upper extremity. Mild left grip weak.. Normal sensation . Normal coordination.   ASSESSMENT/PLAN Veronica Short is an 81 y.o. female with a history  of  atrial fibrillation, sick sinus syndrome, as well as permanent pacemaker, diabetes mellitus, hypertension, hyperlipidemia presenting with left arm weakness, left facial droop, and dysarthria. She did not receive IV t-PA due to rapidly improving deficits.  Stroke:  Non-dominant infarct felt to be embolic secondary to atrial fibrillation.  Resultant  left arm weakness and dysarthria  MRI  not ordered due to permanent pacemaker  MRA  not ordered due to permanent pacemaker  CTA of head pending  Carotid Doppler  Bilateral: 1-39% ICA stenosis. Vertebral artery  flow is antegrade.   2D Echo pending  LDL 137  HgbA1c pending  Lovenox for VTE prophylaxis  Diet NPO time specified  aspirin 325 mg orally every day and clopidogrel 75 mg orally every day prior to admission, now on aspirin 325 mg orally every day and clopidogrel 75 mg orally every day  Change to Eliquis.  Patient counseled to be compliant with her antithrombotic medications  Ongoing aggressive stroke risk factor management  Therapy recommendations:  Pending  Disposition: Pending  Hypertension  Home meds: Atenolol, son, lisinopril-hydrochlorothiazide  Stable - Permissive hypertension <220/120 for 24-48 hours and then gradually normalize within 5-7 days   Hyperlipidemia  Home meds:  Lipitor 20 mg daily resumed in hospital  LDL 137, goal < 70  Increase Lipitor to 40 mg daily  Continue statin at discharge  Diabetes  HgbA1c pending, goal < 7.0  Uncontrolled  Other Stroke Risk Factors  Advanced age  Obesity, Body mass index is 31.66 kg/(m^2).    Other Active Problems  Renal insufficiency  Mild anemia  Other Pertinent History  Arthritis and neuropathy affecting her ambulation.  Hospital day #   Delton See PA-C [facility] Pager [phone] 01/03/2015, 8:10 AM I have personally examined this patient, reviewed notes, independently viewed imaging studies, participated in medical decision making and plan of care. I have made any additions or clarifications directly to the above note. Agree with note above.She presented with speech difficulty and left hand weakness likely from suspected small right brain infarct from AFIb and is at risk for neurological worsening, recurrent stroke and TIA and needs ongoing stroke evaluation and anticoagulation. I d/w patient and family risk benefit of NOACs clearly outweighing her fall risk and recommend eliquis Delia Heady, MD Medical Director [facility] Pager: [phone] 01/03/2015 6:48  PM     To contact Stroke Continuity provider, please refer to [email or web address]. After hours, contact General Neurology

## 2015-01-03 NOTE — Progress Notes (Signed)
PROGRESS NOTE  Veronica Short BRA:[id number] DOB: [birth year] DOA: 01/02/2015 PCP: Rozanna Box, MD   Brief history 81 y.o. female with a history of atrial fibrillation and sick sinus syndrome with pacemaker placement, diabetes mellitus, hypertension and hyperlipidemia, brought to the emergency room and code stroke status with acute onset of left facial droop and left facial weakness as well as slurred speech at 9 PM tonight. She has no history of stroke nor TIA. She's been taking Plavix for platelet therapy. CT scan of the head showed no acute intracranial abnormality. Patient's deficits rapidly improved starting around 10 PM. NIH stroke score at the time of this evaluation was 1  Assessment/Plan: Nonhemorrhagic stroke -Cannot perform MRI or MRA secondary to permanent pacemaker -Echocardiogram results pending -Carotid duplex no hemodynamically significant stenosis -Hemoglobin A1c - pending  -LDL 137  -CT angiogram brain pending  chronic atrial fibrillation -Continue atenolol and diltiazem CD -Discussed the risks, benefits, and alternatives of apixiban--patient understood and agrees to take the medicine and follow treatment protocols -Discontinue aspirin and Plavix Diabetes mellitus type 2 -Hemoglobin A1c -NovoLog sliding scale -Discontinue metformin for now Hypertension -Continue diltiazem and atenolol -If renal function is stable, restart lisinopril -A.m. BMP Hyperlipidemia -Continue Lipitor, increase dose to 40 mg daily GERD -Continue Protonix  CKD stage III  -Baseline creatinine 0.9-1.2   Family Communication:   Daughter update at beside Disposition Plan:   Home when medically stable       Procedures/Studies: Ct Head (brain) Wo Contrast  01/02/2015   CLINICAL DATA:  Code stroke. Left-sided weakness and slurred speech. Patient on Plavix.  EXAM: CT HEAD WITHOUT CONTRAST  TECHNIQUE: Contiguous axial images were obtained from the base of the skull through  the vertex without intravenous contrast.  COMPARISON:  Brain CT 02/04/2012  FINDINGS: Periventricular and subcortical white matter hypodensity compatible with chronic small vessel ischemic changes. No evidence for acute cortically based infarct, intracranial hemorrhage, mass lesion or mass-effect. Orbits are unremarkable. Chronic opacification of the sphenoid sinus. Mucosal thickening involving the ethmoid air cells. Mastoid air cells are unremarkable. Calvarium is intact.  IMPRESSION: No acute intracranial process.  Critical Value/emergent results were called by telephone at the time of interpretation on 01/02/2015 at 10:38 pm to Dr. Roseanne Reno, who verbally acknowledged these results.   Electronically Signed   By: Annia Belt M.D.   On: 01/02/2015 22:39         Subjective:  patient states that her left facial droop and speech are improved but not back to baseline yet. Denies any fevers, chills, chest pain, shortness breath, nausea, vomiting, diarrhea, abdominal pain, dysuria. She complains of a chronic cough without any sputum or hemoptysis.   Objective: Filed Vitals:   01/03/15 0730 01/03/15 0945 01/03/15 1228 01/03/15 1448  BP: 140/65 178/85 165/81 167/88  Pulse: 70 72 57 74  Temp: 97.6 F (36.4 C) 97.8 F (36.6 C)  98.6 F (37 C)  TempSrc: Oral Oral  Oral  Resp:  18 18 18   Height:      Weight:      SpO2: 98% 98% 97% 98%    Intake/Output Summary (Last 24 hours) at 01/03/15 1700 Last data filed at 01/03/15 1200  Gross per 24 hour  Intake    990 ml  Output      0 ml  Net    990 ml   Weight change:  Exam:   General:  Pt is alert, follows commands appropriately,  not in acute distress  HEENT: No icterus, No thrush,  [place]/AT  Cardiovascular: RRR, S1/S2, no rubs, no gallops  Respiratory: CTA bilaterally, no wheezing, no crackles, no rhonchi  Abdomen: Soft/+BS, non tender, non distended, no guarding  Extremities: trace LE edema, No lymphangitis, No petechiae, No rashes, no  synovitis  Data Reviewed: Basic Metabolic Panel:  Recent Labs Lab 01/02/15 2220 01/02/15 2227  NA 136 138  K 4.1 4.2  CL 103 102  CO2 23  --   GLUCOSE 191* 189*  BUN 26* 29*  CREATININE 1.25* 1.40*  CALCIUM 9.1  --    Liver Function Tests:  Recent Labs Lab 01/02/15 2220  AST 17  ALT 9*  ALKPHOS 81  BILITOT 0.5  PROT 6.8  ALBUMIN 3.5   No results for input(s): LIPASE, AMYLASE in the last 168 hours. No results for input(s): AMMONIA in the last 168 hours. CBC:  Recent Labs Lab 01/02/15 2220 01/02/15 2227  WBC 5.9  --   NEUTROABS 2.8  --   HGB 11.1* 11.9*  HCT 33.9* 35.0*  MCV 89.2  --   PLT 209  --    Cardiac Enzymes: No results for input(s): CKTOTAL, CKMB, CKMBINDEX, TROPONINI in the last 168 hours. BNP: Invalid input(s): POCBNP CBG:  Recent Labs Lab 01/03/15 0323 01/03/15 1642  GLUCAP 135* 160*    No results found for this or any previous visit (from the past 240 hour(s)).   Scheduled Meds: . apixaban  2.5 mg Oral BID  . aspirin EC  325 mg Oral Daily  . atenolol  100 mg Oral Daily  . atorvastatin  40 mg Oral QHS  . calcium-vitamin D  1 tablet Oral BID WC  . cholecalciferol  400 Units Oral Daily  . citalopram  20 mg Oral Daily  . clopidogrel  75 mg Oral Daily  . diltiazem  180 mg Oral Daily  . fluticasone  2 spray Each Nare Daily  . insulin aspart  0-9 Units Subcutaneous 6 times per day  . olopatadine  1 drop Both Eyes BID  . pantoprazole  40 mg Oral Daily   Continuous Infusions: . sodium chloride Stopped (01/03/15 1400)     Smith Potenza, DO  [facility] Pager [phone]  If 7PM-7AM, please contact night-coverage [email or web address] Password [facility] 01/03/2015, 5:00 PM

## 2015-01-03 NOTE — Evaluation (Signed)
Physical Therapy Evaluation Patient Details Name: Veronica Short MRN: [id number] DOB: 26-Sep-1923 Today's Date: 01/03/2015   History of Present Illness  81 y.o. female with a history of atrial fibrillation and sick sinus syndrome with pacemaker placement, diabetes mellitus, hypertension and hyperlipidemia, brought to the emergency room and code stroke status with acute onset of left facial droop and left facial weakness as well as slurred speech   Clinical Impression  Pt admitted with the above complications. Pt currently with functional limitations due to the deficits listed below (see PT Problem List). Requires min assist to ambulate safely with a rolling walker. Family reports patient has had several falls at home over the past few months due to "tripping over her own feet." Would greatly benefit from HHPT due to fall risk and increased supervision at home which family states they can provide 24/7, as needed. Pt will benefit from skilled PT to increase their independence and safety with mobility to allow discharge to the venue listed below.       Follow Up Recommendations Home health PT;Supervision for mobility/OOB    Equipment Recommendations  None recommended by PT    Recommendations for Other Services OT consult     Precautions / Restrictions Precautions Precautions: Fall Restrictions Weight Bearing Restrictions: No      Mobility  Bed Mobility               General bed mobility comments: sitting edge of bed when PT entered room  Transfers Overall transfer level: Needs assistance Equipment used: Rolling walker (2 wheeled) Transfers: Sit to/from Stand Sit to Stand: Min guard         General transfer comment: Min guard for safety. VC for hand placement. Relies on RW upon standing.  Ambulation/Gait Ambulation/Gait assistance: Min assist Ambulation Distance (Feet): 85 Feet Assistive device: Rolling walker (2 wheeled) Gait Pattern/deviations: Step-through  pattern;Decreased stride length;Antalgic;Scissoring;Narrow base of support Gait velocity: decreased   General Gait Details: LEs externally rotated. Moderately antalgic favoring Lt side at time. VC for walker placement and energy conservation techniques, requiring one standing rest break to complete distance today. Upon returning to room, pt required min assist for loss of balance while turning due to poor foot placement.  Stairs            Wheelchair Mobility    Modified Rankin (Stroke Patients Only) Modified Rankin (Stroke Patients Only) Pre-Morbid Rankin Score: Moderately severe disability Modified Rankin: Moderately severe disability     Balance Overall balance assessment: Needs assistance;History of Falls Sitting-balance support: No upper extremity supported;Feet supported Sitting balance-Leahy Scale: Good     Standing balance support: No upper extremity supported Standing balance-Leahy Scale: Fair                               Pertinent Vitals/Pain Pain Assessment: 0-10 Pain Score:  ("My feet always hurt" no value given) Pain Location: BIL feet Pain Descriptors / Indicators: Aching Pain Intervention(s): Monitored during session;Repositioned    Home Living Family/patient expects to be discharged to:: Private residence Living Arrangements: Children Available Help at Discharge: Family;Available 24 hours/day Type of Home: House Home Access: Ramped entrance     Home Layout: One level Home Equipment: Walker - 4 wheels;Shower seat      Prior Function Level of Independence: Independent with assistive device(s)         Comments: uses rollator for mobility     Hand Dominance   Dominant Hand:  Right    Extremity/Trunk Assessment   Upper Extremity Assessment: Defer to OT evaluation           Lower Extremity Assessment: Generalized weakness (Reports history of PVD and neuropathy)         Communication   Communication: No difficulties   Cognition Arousal/Alertness: Awake/alert Behavior During Therapy: WFL for tasks assessed/performed Overall Cognitive Status: Within Functional Limits for tasks assessed                      General Comments General comments (skin integrity, edema, etc.): Pt and family reports patient has had several falls at home in the past few months. Pt states typically due to tripping over her own feet although admits she has a history of dizziness and is followed by and ENT. Reviewed safety with mobility, recommendations for HHPT, and increased supervision at home.    Exercises        Assessment/Plan    PT Assessment Patient needs continued PT services  PT Diagnosis Difficulty walking;Abnormality of gait;Generalized weakness   PT Problem List Decreased strength;Decreased range of motion;Decreased activity tolerance;Decreased balance;Decreased mobility;Decreased coordination;Decreased knowledge of use of DME;Pain  PT Treatment Interventions DME instruction;Gait training;Functional mobility training;Therapeutic activities;Therapeutic exercise;Balance training;Neuromuscular re-education;Patient/family education   PT Goals (Current goals can be found in the Care Plan section) Acute Rehab PT Goals Patient Stated Goal: none stated PT Goal Formulation: With patient Time For Goal Achievement: 01/17/15 Potential to Achieve Goals: Good    Frequency Min 4X/week   Barriers to discharge        Co-evaluation               End of Session Equipment Utilized During Treatment: Gait belt Activity Tolerance: Patient tolerated treatment well Patient left: in bed;with call bell/phone within reach;with family/visitor present (sitting Edge of bed) Nurse Communication: Mobility status    Functional Assessment Tool Used: clinical observation Functional Limitation: Mobility: Walking and moving around Mobility: Walking and Moving Around Current Status (A5409): At least 20 percent but less than 40  percent impaired, limited or restricted Mobility: Walking and Moving Around Goal Status [phone]): At least 1 percent but less than 20 percent impaired, limited or restricted    Time: 1725-1747 PT Time Calculation (min) (ACUTE ONLY): 22 min   Charges:   PT Evaluation $Initial PT Evaluation Tier I: 1 Procedure     PT G Codes:   PT G-Codes **NOT FOR INPATIENT CLASS** Functional Assessment Tool Used: clinical observation Functional Limitation: Mobility: Walking and moving around Mobility: Walking and Moving Around Current Status (Y7829): At least 20 percent but less than 40 percent impaired, limited or restricted Mobility: Walking and Moving Around Goal Status [phone]): At least 1 percent but less than 20 percent impaired, limited or restricted    Berton Mount 01/03/2015, 6:09 PM [facility] [place], [place] [phone]

## 2015-01-04 DIAGNOSIS — I6330 Cerebral infarction due to thrombosis of unspecified cerebral artery: Secondary | ICD-10-CM | POA: Diagnosis not present

## 2015-01-04 DIAGNOSIS — E780 Pure hypercholesterolemia: Secondary | ICD-10-CM | POA: Diagnosis not present

## 2015-01-04 DIAGNOSIS — I10 Essential (primary) hypertension: Secondary | ICD-10-CM

## 2015-01-04 DIAGNOSIS — I671 Cerebral aneurysm, nonruptured: Secondary | ICD-10-CM | POA: Diagnosis not present

## 2015-01-04 DIAGNOSIS — G459 Transient cerebral ischemic attack, unspecified: Secondary | ICD-10-CM | POA: Diagnosis not present

## 2015-01-04 DIAGNOSIS — I4891 Unspecified atrial fibrillation: Secondary | ICD-10-CM | POA: Diagnosis not present

## 2015-01-04 DIAGNOSIS — E785 Hyperlipidemia, unspecified: Secondary | ICD-10-CM | POA: Diagnosis not present

## 2015-01-04 DIAGNOSIS — I495 Sick sinus syndrome: Secondary | ICD-10-CM | POA: Diagnosis not present

## 2015-01-04 DIAGNOSIS — I129 Hypertensive chronic kidney disease with stage 1 through stage 4 chronic kidney disease, or unspecified chronic kidney disease: Secondary | ICD-10-CM | POA: Diagnosis not present

## 2015-01-04 DIAGNOSIS — N183 Chronic kidney disease, stage 3 (moderate): Secondary | ICD-10-CM | POA: Diagnosis not present

## 2015-01-04 DIAGNOSIS — I639 Cerebral infarction, unspecified: Secondary | ICD-10-CM | POA: Diagnosis not present

## 2015-01-04 DIAGNOSIS — E119 Type 2 diabetes mellitus without complications: Secondary | ICD-10-CM | POA: Diagnosis not present

## 2015-01-04 DIAGNOSIS — K219 Gastro-esophageal reflux disease without esophagitis: Secondary | ICD-10-CM | POA: Diagnosis not present

## 2015-01-04 DIAGNOSIS — I482 Chronic atrial fibrillation: Secondary | ICD-10-CM | POA: Diagnosis not present

## 2015-01-04 LAB — GLUCOSE, CAPILLARY
GLUCOSE-CAPILLARY: 108 mg/dL — AB (ref 65–99)
GLUCOSE-CAPILLARY: 116 mg/dL — AB (ref 65–99)
Glucose-Capillary: 121 mg/dL — ABNORMAL HIGH (ref 65–99)

## 2015-01-04 LAB — BASIC METABOLIC PANEL
Anion gap: 10 (ref 5–15)
BUN: 17 mg/dL (ref 6–20)
CO2: 26 mmol/L (ref 22–32)
CREATININE: 1.01 mg/dL — AB (ref 0.44–1.00)
Calcium: 9.4 mg/dL (ref 8.9–10.3)
Chloride: 101 mmol/L (ref 101–111)
GFR, EST AFRICAN AMERICAN: 56 mL/min — AB (ref >60)
GFR, EST NON AFRICAN AMERICAN: 48 mL/min — AB (ref >60)
Glucose, Bld: 116 mg/dL — ABNORMAL HIGH (ref 65–99)
Potassium: 4.2 mmol/L (ref 3.5–5.1)
Sodium: 137 mmol/L (ref 135–145)

## 2015-01-04 LAB — CBC
HEMATOCRIT: 36.5 % (ref 36.0–46.0)
HEMOGLOBIN: 12 g/dL (ref 12.0–15.0)
MCH: 29 pg (ref 26.0–34.0)
MCHC: 32.9 g/dL (ref 30.0–36.0)
MCV: 88.2 fL (ref 78.0–100.0)
PLATELETS: 247 10*3/uL (ref 150–400)
RBC: 4.14 MIL/uL (ref 3.87–5.11)
RDW: 12.3 % (ref 11.5–15.5)
WBC: 6.7 10*3/uL (ref 4.0–10.5)

## 2015-01-04 LAB — HEMOGLOBIN A1C
Hgb A1c MFr Bld: 7 % — ABNORMAL HIGH (ref 4.8–5.6)
Mean Plasma Glucose: 154 mg/dL

## 2015-01-04 MED ORDER — OMEPRAZOLE 40 MG PO CPDR: 40.0000 mg | DELAYED_RELEASE_CAPSULE | ORAL | Status: DC

## 2015-01-04 MED ORDER — APIXABAN 2.5 MG PO TABS: 2.5000 mg | ORAL_TABLET | Freq: Two times a day (BID) | ORAL | Status: DC

## 2015-01-04 MED ORDER — APIXABAN 5 MG PO TABS
5.0000 mg | ORAL_TABLET | Freq: Two times a day (BID) | ORAL | Status: DC
  Administered 2015-01-04: 5 mg via ORAL
  Filled 2015-01-04: qty 1

## 2015-01-04 MED ORDER — ATORVASTATIN CALCIUM 40 MG PO TABS: 40.0000 mg | ORAL_TABLET | Freq: Every day | ORAL | Status: AC

## 2015-01-04 MED ORDER — APIXABAN 5 MG PO TABS: 5.0000 mg | ORAL_TABLET | Freq: Two times a day (BID) | ORAL | Status: AC

## 2015-01-04 MED ORDER — CLOPIDOGREL BISULFATE 75 MG PO TABS
75.0000 mg | ORAL_TABLET | Freq: Every day | ORAL | Status: DC
  Administered 2015-01-04: 75 mg via ORAL
  Filled 2015-01-04: qty 1

## 2015-01-04 MED ORDER — INSULIN ASPART 100 UNIT/ML ~~LOC~~ SOLN: 0.0000 [IU] | Freq: Three times a day (TID) | SUBCUTANEOUS | Status: DC

## 2015-01-04 MED ORDER — LISINOPRIL 10 MG PO TABS: 10.0000 mg | ORAL_TABLET | Freq: Every day | ORAL | Status: DC

## 2015-01-04 NOTE — Evaluation (Signed)
Occupational Therapy Evaluation and Discharge Patient Details Name: Veronica Short MRN: [id number] DOB: 02-03-1924 Today's Date: 01/04/2015    History of Present Illness 81 y.o. female with a history of atrial fibrillation and sick sinus syndrome with pacemaker placement, diabetes mellitus, hypertension and hyperlipidemia, brought to the emergency room and code stroke status with acute onset of left facial droop and left facial weakness as well as slurred speech    Clinical Impression   This 81 yo female admitted with above presents to acute OT at a S level and she will have this at home. We will D/C from acute OT.    Follow Up Recommendations  No OT follow up    Equipment Recommendations  None recommended by OT       Precautions / Restrictions Precautions Precautions: Fall Restrictions Weight Bearing Restrictions: No      Mobility Bed Mobility               General bed mobility comments: sitting edge of bed when OT entered room  Transfers Overall transfer level: Needs assistance Equipment used: Rolling walker (2 wheeled) Transfers: Sit to/from Stand Sit to Stand: Supervision         General transfer comment: S for ambulation in room    Balance Overall balance assessment: Needs assistance Sitting-balance support: No upper extremity supported;Feet supported Sitting balance-Leahy Scale: Good     Standing balance support: No upper extremity supported Standing balance-Leahy Scale: Fair                              ADL Overall ADL's : Needs assistance/impaired                                       General ADL Comments: S overall for BADLs     Vision Additional Comments: Wears bifocals--no change          Pertinent Vitals/Pain Pain Assessment: No/denies pain     Hand Dominance Right   Extremity/Trunk Assessment Upper Extremity Assessment Upper Extremity Assessment: Overall WFL for tasks assessed            Communication Communication Communication: No difficulties   Cognition Arousal/Alertness: Awake/alert Behavior During Therapy: WFL for tasks assessed/performed Overall Cognitive Status: Within Functional Limits for tasks assessed                                Home Living Family/patient expects to be discharged to:: Private residence Living Arrangements: Children Available Help at Discharge: Family;Available 24 hours/day Type of Home: House Home Access: Ramped entrance     Home Layout: One level     Bathroom Shower/Tub: Tub/shower unit;Curtain Shower/tub characteristics: Engineer, building services: Standard     Home Equipment: Environmental consultant - 4 wheels;Bedside commode (says family is going to get her a shower seat)          Prior Functioning/Environment Level of Independence: Independent with assistive device(s)        Comments: uses rollator for mobility    OT Diagnosis: Generalized weakness         OT Goals(Current goals can be found in the care plan section) Acute Rehab OT Goals Patient Stated Goal: home today  OT Frequency:            End of Session Equipment Utilized  During Treatment: Rolling walker  Activity Tolerance: Patient tolerated treatment well Patient left: in chair;with call bell/phone within reach   Time: 0851-0928 OT Time Calculation (min): 37 min Charges:  OT General Charges $OT Visit: 1 Procedure OT Treatments $Self Care/Home Management : 8-22 mins G-Codes: OT G-codes **NOT FOR INPATIENT CLASS** Functional Assessment Tool Used: clinical observation Functional Limitation: Self care Self Care Current Status (H7342): At least 1 percent but less than 20 percent impaired, limited or restricted Self Care Goal Status (A7681): At least 1 percent but less than 20 percent impaired, limited or restricted Self Care Discharge Status [phone]): At least 1 percent but less than 20 percent impaired, limited or restricted  Evette Georges  [phone] 01/04/2015, 10:38 AM

## 2015-01-04 NOTE — Care Management Note (Signed)
Case Management Note  Patient Details  Name: Veronica Short MRN: [id number] Date of Birth: Aug 10, 1923  Subjective/Objective:                    Action/Plan: Met with patient and daughter to discuss discharge planning.  Patient will be discharged home on Eliquis.  Benefits check was run and patient will have a $47 copay, no prior authorization required.  Attending MD aware.  Patient states she IS able to afford this.  CM provided a 30 day free card for Eliquis.  Patient selected [facility] for HHPT. Miranda with [facility] was notified and has accepted the referral for discharge home today.  Patient presented CM with a supplemental insurance card.  Card was photocopied, returned to patient, and information was faxed to [facility] in admitting. Expected Discharge Date:                  Expected Discharge Plan:  [name] (Patient lives with daughter)  In-House Referral:     Discharge planning Services  CM Consult  Post Acute Care Choice:    Choice offered to:  Patient, Adult Children  DME Arranged:    DME Agency:     HH Arranged:  PT [place] Agency:  [place] [place]  Status of Service:  Completed, signed off  Medicare Important Message Given:  No Date Medicare IM Given:    Medicare IM give by:    Date Additional Medicare IM Given:    Additional Medicare Important Message give by:     If discussed at [place] of Stay Meetings, dates discussed:    Additional Comments:  Rolm Baptise, RN 01/04/2015, 1:57 PM

## 2015-01-04 NOTE — Evaluation (Signed)
Physical Therapy Evaluation Patient Details Name: Veronica Short MRN: [id number] DOB: 1923-08-12 Today's Date: 01/04/2015   History of Present Illness  81 y.o. female with a history of atrial fibrillation and sick sinus syndrome with pacemaker placement, diabetes mellitus, hypertension and hyperlipidemia, brought to the emergency room and code stroke status with acute onset of left facial droop and left facial weakness as well as slurred speech   Clinical Impression  Good progress today with physical therapy. Tolerated increased ambulatory distance and some lower level balance activities. Balance and coordination of gait with RW improved from previous therapy session. Continue to recommend HHPT for progression of balance and gait at d/c.    Follow Up Recommendations Home health PT;Supervision for mobility/OOB    Equipment Recommendations  None recommended by PT    Recommendations for Other Services OT consult     Precautions / Restrictions Precautions Precautions: Fall Restrictions Weight Bearing Restrictions: No      Mobility  Bed Mobility               General bed mobility comments: sitting in recliner  Transfers Overall transfer level: Needs assistance Equipment used: Rolling walker (2 wheeled) Transfers: Sit to/from Stand Sit to Stand: Min guard         General transfer comment: min guard for safety. VC for hand placement. Upon standing prefers RW for support. Also tried hand held assist for balance but not as confident.  Ambulation/Gait Ambulation/Gait assistance: Min guard Ambulation Distance (Feet): 100 Feet Assistive device: Rolling walker (2 wheeled) Gait Pattern/deviations: Step-through pattern;Decreased stride length;Narrow base of support;Antalgic Gait velocity: decreased   General Gait Details: Min guard for safety. Improved balance today. VC to widen base of support.Challenged balance with high marching, backwards stepping, and figure 8 turns. No  loss of balance today. Cues for walker placement intermittently.  Stairs            Wheelchair Mobility    Modified Rankin (Stroke Patients Only) Modified Rankin (Stroke Patients Only) Pre-Morbid Rankin Score: Moderately severe disability Modified Rankin: Moderately severe disability     Balance Overall balance assessment: Needs assistance Sitting-balance support: No upper extremity supported;Feet supported Sitting balance-Leahy Scale: Good     Standing balance support: No upper extremity supported Standing balance-Leahy Scale: Fair   Single Leg Stance - Right Leg: 0.25 (BIL hand support) Single Leg Stance - Left Leg: 0.25 (BIL hand support) Tandem Stance - Right Leg: 0.5 (single hand support) Tandem Stance - Left Leg: 0.5 (single hand support) [place] - Eyes Opened: 0.25                   Pertinent Vitals/Pain Pain Assessment: No/denies pain Pain Intervention(s): Monitored during session    Home Living Family/patient expects to be discharged to:: Private residence Living Arrangements: Children Available Help at Discharge: Family;Available 24 hours/day Type of Home: House Home Access: Ramped entrance     Home Layout: One level Home Equipment: Walker - 4 wheels;Bedside commode (says family is going to get her a shower seat)      Prior Function Level of Independence: Independent with assistive device(s)         Comments: uses rollator for mobility     Hand Dominance   Dominant Hand: Right    Extremity/Trunk Assessment   Upper Extremity Assessment: Overall WFL for tasks assessed                     Communication   Communication: No difficulties  Cognition Arousal/Alertness: Awake/alert Behavior During Therapy: WFL for tasks assessed/performed Overall Cognitive Status: Within Functional Limits for tasks assessed                      General Comments General comments (skin integrity, edema, etc.): Daughter present and  states pt will have more supervision at home initially.    Exercises General Exercises - Lower Extremity Long Arc Quad: Strengthening;Both;10 reps;Seated Hip Flexion/Marching: Strengthening;Both;10 reps;Seated      Assessment/Plan    PT Assessment    PT Diagnosis     PT Problem List    PT Treatment Interventions     PT Goals (Current goals can be found in the Care Plan section) Acute Rehab PT Goals Patient Stated Goal: none stated PT Goal Formulation: With patient Time For Goal Achievement: 01/17/15 Potential to Achieve Goals: Good    Frequency Min 4X/week   Barriers to discharge        Co-evaluation               End of Session Equipment Utilized During Treatment: Gait belt Activity Tolerance: Patient tolerated treatment well Patient left: with call bell/phone within reach;with family/visitor present;in chair;with nursing/sitter in room (sitting Edge of bed) Nurse Communication: Mobility status         Time: 7096-2836 PT Time Calculation (min) (ACUTE ONLY): 14 min   Charges:     PT Treatments $Gait Training: 8-22 mins   PT G Codes:        Berton Mount 01/04/2015, 11:25 AM Charlsie Merles, PT [phone]

## 2015-01-04 NOTE — Progress Notes (Signed)
Pt is discharged home. Discharge instructions were given to patient and family 

## 2015-01-04 NOTE — Discharge Instructions (Signed)
Metformin and X-ray Contrast Studies For some X-ray exams, a contrast dye is used. Contrast dye is a type of medicine used to make the X-ray image clearer. The contrast dye is given to the patient through a vein (intravenously). If you need to have this type of X-ray exam and you take a medication called metformin, your caregiver may have you stop taking metformin before the exam.  LACTIC ACIDOSIS In rare cases, a serious medical condition called lactic acidosis can develop in people who take metformin and receive contrast dye. The following conditions can increase the risk of this complication:   Kidney failure.  Liver problems.  Certain types of heart problems such as:  Heart failure.  Heart attack.  Heart infection.  Heart valve problems.  Alcohol abuse. If left untreated, lactic acidosis can lead to coma.  SYMPTOMS OF LACTIC ACIDOSIS Symptoms of lactic acidosis can include:  Rapid breathing (hyperventilation).  Neurologic symptoms such as:  Headaches.  Confusion.  Dizziness.  Excessive sweating.  Feeling sick to your stomach (nauseous) or throwing up (vomiting). AFTER THE X-RAY EXAM  Stay well-hydrated. Drink fluids as instructed by your caregiver.  If you have a risk of developing lactic acidosis, blood tests may be done to make sure your kidney function is okay.  Metformin is usually stopped for 48 hours after the X-ray exam. Ask your caregiver when you can start taking metformin again. SEEK MEDICAL CARE IF:   You have shortness of breath or difficulty breathing.  You develop a headache that does not go away.  You have nausea or vomiting.  You urinate more than normal.  You develop a skin rash and have:  Redness.  Swelling.  Itching. Document Released: 06/17/2009 Document Revised: 09/21/2011 Document Reviewed: 06/17/2009 [facility] Patient Information 2015 [place], [place]. This information is not intended to replace advice given to you by your health  care provider. Make sure you discuss any questions you have with your health care provider.  Information on my medicine - ELIQUIS (apixaban)  This medication education was reviewed with me or my healthcare representative as part of my discharge preparation.  The pharmacist that spoke with me during my hospital stay was:  Fredrik Rigger, [facility]  Why was Eliquis prescribed for you? Eliquis was prescribed for you to reduce the risk of a blood clot forming that can cause a stroke if you have a medical condition called atrial fibrillation (a type of irregular heartbeat).  What do You need to know about Eliquis ? Take your Eliquis TWICE DAILY - one tablet in the morning and one tablet in the evening with or without food. If you have difficulty swallowing the tablet whole please discuss with your pharmacist how to take the medication safely.  Take Eliquis exactly as prescribed by your doctor and DO NOT stop taking Eliquis without talking to the doctor who prescribed the medication.  Stopping may increase your risk of developing a stroke.  Refill your prescription before you run out.  After discharge, you should have regular check-up appointments with your healthcare provider that is prescribing your Eliquis.  In the future your dose may need to be changed if your kidney function or weight changes by a significant amount or as you get older.  What do you do if you miss a dose? If you miss a dose, take it as soon as you remember on the same day and resume taking twice daily.  Do not take more than one dose of ELIQUIS at the same  time to make up a missed dose.  Important Safety Information A possible side effect of Eliquis is bleeding. You should call your healthcare provider right away if you experience any of the following: ? Bleeding from an injury or your nose that does not stop. ? Unusual colored urine (red or dark brown) or unusual colored stools (red or black). ? Unusual bruising for  unknown reasons. ? A serious fall or if you hit your head (even if there is no bleeding).  Some medicines may interact with Eliquis and might increase your risk of bleeding or clotting while on Eliquis. To help avoid this, consult your healthcare provider or pharmacist prior to using any new prescription or non-prescription medications, including herbals, vitamins, non-steroidal anti-inflammatory drugs (NSAIDs) and supplements.  This website has more information on Eliquis (apixaban): http://[email or web address].com/eliquis/home

## 2015-01-04 NOTE — Progress Notes (Signed)
STROKE TEAM PROGRESS NOTE   HISTORY Veronica Short is an 81 y.o. female with a history of atrial fibrillation and sick sinus syndrome with pacemaker placement, diabetes mellitus, hypertension and hyperlipidemia, brought to the emergency room and code stroke status with acute onset of left facial droop and left facial weakness as well as slurred speech at 9 PM tonight. She has no history of stroke nor TIA. She's been taking Plavix for platelet therapy. CT scan of the head showed no acute intracranial abnormality. Patient's deficits rapidly improved starting around 10 PM. NIH stroke score at the time of this evaluation was 1, with slight left sensory changes persisting.  LSN: 9 PM on 01/02/2015 tPA Given: No: Rapidly resolving deficits mRankin:   SUBJECTIVE (INTERVAL HISTORY) The patient's daughter is at the bedside. The plan is for discharge today. Dr. Pearlean Brownie explained that this was either a TIA or a very small stroke that did not show up on the CT scan or CTA. He also, once again, discussed Eliquis therapy.   OBJECTIVE Temp:  [97.7 F (36.5 C)-98.6 F (37 C)] 97.7 F (36.5 C) (06/24 1002) Pulse Rate:  [50-68] 50 (06/24 1002) Cardiac Rhythm:  [-] Ventricular paced;Atrial fibrillation (06/24 0800) Resp:  [16-18] 18 (06/24 1002) BP: (126-172)/(59-77) 138/65 mmHg (06/24 1002) SpO2:  [96 %-99 %] 96 % (06/24 1002)   Recent Labs Lab 01/03/15 1642 01/03/15 2007 01/04/15 0021 01/04/15 0646 01/04/15 1131  GLUCAP 160* 150* 121* 108* 116*    Recent Labs Lab 01/02/15 2220 01/02/15 2227 01/04/15 0408  NA 136 138 137  K 4.1 4.2 4.2  CL 103 102 101  CO2 23  --  26  GLUCOSE 191* 189* 116*  BUN 26* 29* 17  CREATININE 1.25* 1.40* 1.01*  CALCIUM 9.1  --  9.4    Recent Labs Lab 01/02/15 2220  AST 17  ALT 9*  ALKPHOS 81  BILITOT 0.5  PROT 6.8  ALBUMIN 3.5    Recent Labs Lab 01/02/15 2220 01/02/15 2227 01/04/15 0408  WBC 5.9  --  6.7  NEUTROABS 2.8  --   --   HGB 11.1*  11.9* 12.0  HCT 33.9* 35.0* 36.5  MCV 89.2  --  88.2  PLT 209  --  247   No results for input(s): CKTOTAL, CKMB, CKMBINDEX, TROPONINI in the last 168 hours.  Recent Labs  01/02/15 2220  LABPROT 14.9  INR 1.16   No results for input(s): COLORURINE, LABSPEC, PHURINE, GLUCOSEU, HGBUR, BILIRUBINUR, KETONESUR, PROTEINUR, UROBILINOGEN, NITRITE, LEUKOCYTESUR in the last 72 hours.  Invalid input(s): APPERANCEUR     Component Value Date/Time   CHOL 215* 01/03/2015 0501   TRIG 171* 01/03/2015 0501   HDL 44 01/03/2015 0501   CHOLHDL 4.9 01/03/2015 0501   VLDL 34 01/03/2015 0501   LDLCALC 137* 01/03/2015 0501   Lab Results  Component Value Date   HGBA1C 7.0* 01/03/2015   No results found for: LABOPIA, COCAINSCRNUR, LABBENZ, AMPHETMU, THCU, LABBARB  No results for input(s): ETH in the last 168 hours.    Imaging    Ct Head (brain) Wo Contrast 01/02/2015    No acute intracranial process.     CTA of the Head  01/03/2015    1. Bilateral P3 superior division high-grade stenoses, worse on the right, but with preserved distal PCA flow bilaterally. 2. Moderate left and mild right ICA siphon stenosis related to calcified plaque. Negative MCA and ACA branches. 3. Small thrombosed appearing 4 mm left supraclinoid ICA aneurysm. 4. Stable  CT appearance of the brain since yesterday. No acute intracranial abnormality identified. 5. Chronic posterior right ethmoid and sphenoid sinusitis.     PHYSICAL EXAM Pleasant elderly Caucasian lady not in distress. . Afebrile. Head is nontraumatic. Neck is supple without bruit.    Cardiac exam no murmur or gallop. Lungs are clear to auscultation. Distal pulses are well felt.  Neurological Exam : Awake alert oriented x 3 normal speech and language. Mild left lower face asymmetry. Tongue midline. No drift. Mild diminished fine finger movements on left. Orbits right over left upper extremity. Mild left grip weak.. Normal sensation . Normal  coordination.   ASSESSMENT/PLAN Veronica Short is an 81 y.o. female with a history of  atrial fibrillation, sick sinus syndrome, as well as permanent pacemaker, diabetes mellitus, hypertension, hyperlipidemia presenting with left arm weakness, left facial droop, and dysarthria. She did not receive IV t-PA due to rapidly improving deficits.  Stroke:  Non-dominant infarct felt to be embolic secondary to atrial fibrillation.  Resultant  left arm weakness and dysarthria  MRI  not ordered due to permanent pacemaker  MRA  not ordered due to permanent pacemaker  CTA of head as above - no stroke noted  Carotid Doppler  Bilateral: 1-39% ICA stenosis. Vertebral artery flow is antegrade.   2D Echo EF 60-65%. No cardiac source of emboli identified.  LDL 137  HgbA1c 7.0  Lovenox for VTE prophylaxis Diet heart healthy/carb modified Room service appropriate?: Yes; Fluid consistency:: Thin Diet - low sodium heart healthy  aspirin 325 mg orally every day and clopidogrel 75 mg orally every day prior to admission, now on aspirin 325 mg orally every day and clopidogrel 75 mg orally every day  Change to Eliquis.  Patient counseled to be compliant with her antithrombotic medications  Ongoing aggressive stroke risk factor management  Therapy recommendations:  Home health physical therapy recommended  Disposition: Discharge today  Hypertension  Home meds: Atenolol, son, lisinopril-hydrochlorothiazide  Stable - Permissive hypertension <220/120 for 24-48 hours and then gradually normalize within 5-7 days   Hyperlipidemia  Home meds:  Lipitor 20 mg daily resumed in hospital  LDL 137, goal < 70  Increase Lipitor to 40 mg daily  Continue statin at discharge  Diabetes  HgbA1c 7.0, goal < 7.0  Uncontrolled  Other Stroke Risk Factors  Advanced age  Obesity, Body mass index is 31.66 kg/(m^2).    Other Active Problems  Renal insufficiency  Mild anemia  Other Pertinent  History  Arthritis and neuropathy affecting her ambulation.  PLAN - discharge today  Hospital day #   Delton See PA-C [facility] Pager [phone] 01/04/2015, 3:49 PM I have personally examined this patient, reviewed notes, independently viewed imaging studies, participated in medical decision making and plan of care. I have made any additions or clarifications directly to the above note. Agree with note above.  Discharge home today. Follow-up as an outpatient in 2 months. Kindly call for questions. Delia Heady, MD Medical Director [facility] Pager: [phone] 01/04/2015 4:17 PM      To contact Stroke Continuity provider, please refer to [email or web address]. After hours, contact General Neurology

## 2015-01-04 NOTE — Evaluation (Signed)
SLP Cancellation Note  Patient Details Name: SERAFINA EMMANUEL MRN: [id number] DOB: Jul 19, 1923   Cancelled treatment:       Reason Eval/Treat Not Completed: Other (comment) (pt passed RNSSS, reorder if indicated please)  Donavan Burnet, MS [facility] SLP [phone]

## 2015-01-04 NOTE — Discharge Summary (Addendum)
Physician Discharge Summary  Veronica Short EAV:[id number] DOB: 1924-02-01 DOA: 01/02/2015  PCP: Rozanna Box, MD  Admit date: 01/02/2015 Discharge date: 01/04/2015  Recommendations for Outpatient Follow-up:  1. Pt will need to follow up with PCP in 1-2 weeks post discharge 2. Please obtain BMP and CBC in one week  Discharge Diagnoses:  Nonhemorrhagic stroke -Cannot perform MRI or MRA secondary to permanent pacemaker -Neurology was consulted and felt the patient had a nonhemorrhagic stroke -Echocardiogram--EF 60-65%, no intracardiac thrombi, no wall motion abnormality -Carotid duplex no hemodynamically significant stenosis -Hemoglobin A1c - 7.0 -LDL 137 -CT angiogram brain--Bilateral P3 superior division high-grade stenoses, worse on the right, but with preserved distal PCA flow bilaterally; Moderate left and mild right ICA siphon stenosis related to calcified plaque. Negative MCA and ACA branches. chronic atrial fibrillation -Continue atenolol and diltiazem CD -Discussed the risks, benefits, and alternatives of apixiban--patient understood and agrees to take the medicine and follow treatment protocols -Patient will be discharged with apixiban 5 mg twice a day -Discontinue aspirin  -The patient had a coronary stent supposedly placed in 2012. He is unclear if it was a bare metal stent or drug-eluting stent. Nonetheless, the patient will resume Plavix until she follows up with her cardiologist Diabetes mellitus type 2 -Hemoglobin A1c--7.0 -NovoLog sliding scale -Discontinue metformin--during the hospitalization -Metformin can be resumed after discharge, but her serum creatinine needs to be monitored Hypertension -Continue diltiazem and atenolol -If renal function is stable, restart lower dose lisinopril -Serum creatinine 1.01 on the day of discharge -Restart lisinopril 10 mg daily. Recommend rechecking BMP in 1 week after restarting -Discontinue HCTZ for now given the  patient's CKD stage III Hyperlipidemia -Continue Lipitor, increase dose to 40 mg daily GERD -Continue Protonix -Patient complained of ongoing GERD symptoms with Prilosec 20 mg at home. Her omeprazole will be increased to 40 mg daily after discharge CKD stage III  -Baseline creatinine 0.9-1.2   Discharge Condition: stable  Disposition: home with HHPT  Diet:carb modified Wt Readings from Last 3 Encounters:  01/03/15 68.7 kg (151 lb 7.3 oz)  11/15/13 64.864 kg (143 lb)    History of present illness:  81 y.o. female with a history of atrial fibrillation and sick sinus syndrome with pacemaker placement, diabetes mellitus, hypertension and hyperlipidemia, brought to the emergency room and code stroke status with acute onset of left facial droop and left facial weakness as well as slurred speech at 9 PM tonight. She has no history of stroke nor TIA. She's been taking Plavix for platelet therapy. CT scan of the head showed no acute intracranial abnormality. Patient's deficits rapidly improved starting around 10 PM. NIH stroke score at the time of this evaluation was 1. Neurology was consulted. They felt the patient likely had a nonhemorrhagic stroke. After discussion of the risks, benefits, and alternatives, the patient was started on liquids. She expressed understanding and was agreeable to follow treatment protocols. The patient was continued on Plavix but aspirin was discontinued. The patient has a previous history of a cardiac stent in 2012. Consultants: Neurology  Discharge Exam: Filed Vitals:   01/04/15 0511  BP: 152/59  Pulse: 68  Temp: 97.9 F (36.6 C)  Resp: 18   Filed Vitals:   01/03/15 1749 01/03/15 2110 01/04/15 0036 01/04/15 0511  BP: 126/77 157/64 172/60 152/59  Pulse: 68 60 67 68  Temp: 98.6 F (37 C) 98 F (36.7 C) 98.4 F (36.9 C) 97.9 F (36.6 C)  TempSrc: Oral Oral Oral Oral  Resp:  18 16 17 18   Height:      Weight:      SpO2: 98% 97% 97% 99%   General: A&O  x 3, NAD, pleasant, cooperative Cardiovascular: IRRR, no rub, no gallop, no S3 Respiratory: CTAB, no wheeze, no rhonchi Abdomen:soft, nontender, nondistended, positive bowel sounds Extremities: No edema, No lymphangitis, no petechiae  Discharge Instructions      Discharge Instructions    Diet - low sodium heart healthy    Complete by:  As directed      Discharge instructions    Complete by:  As directed   Stop Aspirin 325 mg daily Stop atorvastatin 20mg  daily Stop lisinopril/HCTZ Start Eliquis 2.5mg  two time a day Continue plavix 75 mg daily Start lisinopril 10 mg daily Follow up with your family doctor and heart doctor in one week     Increase activity slowly    Complete by:  As directed             Medication List    STOP taking these medications        aspirin EC 325 MG tablet     lisinopril-hydrochlorothiazide 20-12.5 MG per tablet  Commonly known as:  PRINZIDE,ZESTORETIC      TAKE these medications        acetaminophen 500 MG tablet  Commonly known as:  TYLENOL  Take 1,000 mg by mouth every 6 (six) hours as needed for mild pain.     apixaban 5 MG Tabs tablet  Commonly known as:  ELIQUIS  Take 1 tablet (5 mg total) by mouth 2 (two) times daily.     atenolol 100 MG tablet  Commonly known as:  TENORMIN  Take 100 mg by mouth daily.     atorvastatin 40 MG tablet  Commonly known as:  LIPITOR  Take 1 tablet (40 mg total) by mouth at bedtime.     CALCIUM PO  Take 2 tablets by mouth 2 (two) times daily.     citalopram 20 MG tablet  Commonly known as:  CELEXA  Take 20 mg by mouth daily.     clopidogrel 75 MG tablet  Commonly known as:  PLAVIX  Take 75 mg by mouth daily.     diltiazem 180 MG 24 hr capsule  Commonly known as:  CARDIZEM CD  Take 180 mg by mouth daily.     fluticasone 50 MCG/ACT nasal spray  Commonly known as:  FLONASE  Place 2 sprays into both nostrils daily.     lisinopril 10 MG tablet  Commonly known as:  PRINIVIL  Take 1 tablet  (10 mg total) by mouth daily.     metFORMIN 500 MG 24 hr tablet  Commonly known as:  GLUCOPHAGE-XR  Take 500 mg by mouth daily with breakfast.     nitroGLYCERIN 0.4 MG SL tablet  Commonly known as:  NITROSTAT  Place 0.4 mg under the tongue every 5 (five) minutes as needed for chest pain.     omeprazole 40 MG capsule  Commonly known as:  PRILOSEC  Take 1 capsule (40 mg total) by mouth every morning.     PATADAY 0.2 % Soln  Generic drug:  Olopatadine HCl  Place 2 drops into both eyes every morning.     traMADol 50 MG tablet  Commonly known as:  ULTRAM  Take 50 mg by mouth every 6 (six) hours as needed for moderate pain.     VITAMIN D PO  Take 1 tablet by mouth every morning.  VITAMIN E PO  Take 1 tablet by mouth every morning.         The results of significant diagnostics from this hospitalization (including imaging, microbiology, ancillary and laboratory) are listed below for reference.    Significant Diagnostic Studies: Ct Angio Head W/cm &/or Wo Cm  01/03/2015   CLINICAL DATA:  81 year old female admitted for left side extremity weakness and facial droop. TIA. Initial encounter.  EXAM: CT ANGIOGRAPHY HEAD  TECHNIQUE: Multidetector CT imaging of the head was performed using the standard protocol during bolus administration of intravenous contrast. Multiplanar CT image reconstructions and MIPs were obtained to evaluate the vascular anatomy.  CONTRAST:  50mL OMNIPAQUE IOHEXOL 350 MG/ML SOLN  COMPARISON:  Head CT without contrast 01/02/2015, and earlier.  FINDINGS: Left chest cardiac pacemaker evident on the scout view.  CT HEAD  Brain: No intracranial mass effect or ventriculomegaly. Gray-white matter differentiation appears stable since yesterday. No evidence of cortically based acute infarction identified. No acute intracranial hemorrhage identified.  Calvarium and skull base:  No acute osseous abnormality identified.  Paranasal sinuses: Chronic right sphenoid and posterior  ethmoid sinusitis. Other Visualized paranasal sinuses and mastoids are clear.  Orbits: No acute orbit or scalp soft tissue finding.  CTA HEAD  Posterior circulation: Codominant distal vertebral arteries are patent. There is left V4 segment calcified plaque. The right V4 segment is diminutive beyond the right PICA origin and irregular. Up to mild distal vertebral artery stenosis bilaterally. Dominant left AICA. Patent vertebrobasilar junction. No basilar artery stenosis. SCA and PCA origins are within normal limits. Both posterior communicating arteries are present. The left P1 segment is tortuous.  On the left there is distal PCA flow but there are tandem high-grade stenoses of the left P3 superior division (series 13, image 21).  On the right there is a high-grade stenosis of the superior P3 segment along a 10 mm distance. Ultimately there is preserved distal right PCA flow (series 11, image 14 and series 13, image 15).  Anterior circulation: Retropharyngeal tortuous cervical left ICA. Both ICA siphons are patent. There is cavernous and supra clinoid calcified plaque. Mild supra clinoid right ICA stenosis on the right and moderate on the left (series 9, image 80). Arising just distal to the left ICA siphon stenosis and directed cephalad there is a rim calcified thrombosis appearing 3-4 mm aneurysm (series 9, image 80 and series 10, image 94).  Both carotid termini remain patent.  MCA and ACA origins are patent.  Anterior communicating artery and bilateral ACA branches are within normal limits. Left MCA branches are within normal limits. Right MCA branches are within normal limits.  Venous sinuses: Grossly patent.  Anatomic variants: None.  Delayed phase:No abnormal enhancement identified.  IMPRESSION: 1. Bilateral P3 superior division high-grade stenoses, worse on the right, but with preserved distal PCA flow bilaterally. 2. Moderate left and mild right ICA siphon stenosis related to calcified plaque. Negative MCA  and ACA branches. 3. Small thrombosed appearing 4 mm left supraclinoid ICA aneurysm. 4. Stable CT appearance of the brain since yesterday. No acute intracranial abnormality identified. 5. Chronic posterior right ethmoid and sphenoid sinusitis.   Electronically Signed   By: Odessa Fleming M.D.   On: 01/03/2015 19:33   Ct Head (brain) Wo Contrast  01/02/2015   CLINICAL DATA:  Code stroke. Left-sided weakness and slurred speech. Patient on Plavix.  EXAM: CT HEAD WITHOUT CONTRAST  TECHNIQUE: Contiguous axial images were obtained from the base of the skull through the vertex without  intravenous contrast.  COMPARISON:  Brain CT 02/04/2012  FINDINGS: Periventricular and subcortical white matter hypodensity compatible with chronic small vessel ischemic changes. No evidence for acute cortically based infarct, intracranial hemorrhage, mass lesion or mass-effect. Orbits are unremarkable. Chronic opacification of the sphenoid sinus. Mucosal thickening involving the ethmoid air cells. Mastoid air cells are unremarkable. Calvarium is intact.  IMPRESSION: No acute intracranial process.  Critical Value/emergent results were called by telephone at the time of interpretation on 01/02/2015 at 10:38 pm to Dr. Roseanne Reno, who verbally acknowledged these results.   Electronically Signed   By: Annia Belt M.D.   On: 01/02/2015 22:39     Microbiology: No results found for this or any previous visit (from the past 240 hour(s)).   Labs: Basic Metabolic Panel:  Recent Labs Lab 01/02/15 2220 01/02/15 2227 01/04/15 0408  NA 136 138 137  K 4.1 4.2 4.2  CL 103 102 101  CO2 23  --  26  GLUCOSE 191* 189* 116*  BUN 26* 29* 17  CREATININE 1.25* 1.40* 1.01*  CALCIUM 9.1  --  9.4   Liver Function Tests:  Recent Labs Lab 01/02/15 2220  AST 17  ALT 9*  ALKPHOS 81  BILITOT 0.5  PROT 6.8  ALBUMIN 3.5   No results for input(s): LIPASE, AMYLASE in the last 168 hours. No results for input(s): AMMONIA in the last 168  hours. CBC:  Recent Labs Lab 01/02/15 2220 01/02/15 2227 01/04/15 0408  WBC 5.9  --  6.7  NEUTROABS 2.8  --   --   HGB 11.1* 11.9* 12.0  HCT 33.9* 35.0* 36.5  MCV 89.2  --  88.2  PLT 209  --  247   Cardiac Enzymes: No results for input(s): CKTOTAL, CKMB, CKMBINDEX, TROPONINI in the last 168 hours. BNP: Invalid input(s): POCBNP CBG:  Recent Labs Lab 01/03/15 0323 01/03/15 1642 01/03/15 2007 01/04/15 0021 01/04/15 0646  GLUCAP 135* 160* 150* 121* 108*    Time coordinating discharge:  Greater than 30 minutes  Signed:  Jamill Wetmore, DO [facility] Pager: [phone] 01/04/2015, 9:48 AM

## 2015-01-05 DIAGNOSIS — N183 Chronic kidney disease, stage 3 (moderate): Secondary | ICD-10-CM | POA: Diagnosis not present

## 2015-01-05 DIAGNOSIS — I2510 Atherosclerotic heart disease of native coronary artery without angina pectoris: Secondary | ICD-10-CM | POA: Diagnosis not present

## 2015-01-05 DIAGNOSIS — I69320 Aphasia following cerebral infarction: Secondary | ICD-10-CM | POA: Diagnosis not present

## 2015-01-05 DIAGNOSIS — I129 Hypertensive chronic kidney disease with stage 1 through stage 4 chronic kidney disease, or unspecified chronic kidney disease: Secondary | ICD-10-CM | POA: Diagnosis not present

## 2015-01-05 DIAGNOSIS — I69354 Hemiplegia and hemiparesis following cerebral infarction affecting left non-dominant side: Secondary | ICD-10-CM | POA: Diagnosis not present

## 2015-01-05 DIAGNOSIS — E119 Type 2 diabetes mellitus without complications: Secondary | ICD-10-CM | POA: Diagnosis not present

## 2015-01-05 DIAGNOSIS — Z950 Presence of cardiac pacemaker: Secondary | ICD-10-CM | POA: Diagnosis not present

## 2015-01-05 DIAGNOSIS — I482 Chronic atrial fibrillation: Secondary | ICD-10-CM | POA: Diagnosis not present

## 2015-01-07 DIAGNOSIS — M12812 Other specific arthropathies, not elsewhere classified, left shoulder: Secondary | ICD-10-CM | POA: Diagnosis not present

## 2015-01-07 DIAGNOSIS — M25512 Pain in left shoulder: Secondary | ICD-10-CM | POA: Diagnosis not present

## 2015-01-07 DIAGNOSIS — Z09 Encounter for follow-up examination after completed treatment for conditions other than malignant neoplasm: Secondary | ICD-10-CM | POA: Diagnosis not present

## 2015-01-07 DIAGNOSIS — M12819 Other specific arthropathies, not elsewhere classified, unspecified shoulder: Secondary | ICD-10-CM | POA: Insufficient documentation

## 2015-01-07 DIAGNOSIS — M75102 Unspecified rotator cuff tear or rupture of left shoulder, not specified as traumatic: Secondary | ICD-10-CM

## 2015-01-07 DIAGNOSIS — S42225A 2-part nondisplaced fracture of surgical neck of left humerus, initial encounter for closed fracture: Secondary | ICD-10-CM | POA: Diagnosis not present

## 2015-01-08 DIAGNOSIS — I69320 Aphasia following cerebral infarction: Secondary | ICD-10-CM | POA: Diagnosis not present

## 2015-01-08 DIAGNOSIS — E119 Type 2 diabetes mellitus without complications: Secondary | ICD-10-CM | POA: Diagnosis not present

## 2015-01-08 DIAGNOSIS — Z950 Presence of cardiac pacemaker: Secondary | ICD-10-CM | POA: Diagnosis not present

## 2015-01-08 DIAGNOSIS — I482 Chronic atrial fibrillation: Secondary | ICD-10-CM | POA: Diagnosis not present

## 2015-01-08 DIAGNOSIS — I6789 Other cerebrovascular disease: Secondary | ICD-10-CM | POA: Diagnosis not present

## 2015-01-08 DIAGNOSIS — N183 Chronic kidney disease, stage 3 (moderate): Secondary | ICD-10-CM | POA: Diagnosis not present

## 2015-01-08 DIAGNOSIS — I2510 Atherosclerotic heart disease of native coronary artery without angina pectoris: Secondary | ICD-10-CM | POA: Diagnosis not present

## 2015-01-08 DIAGNOSIS — I69354 Hemiplegia and hemiparesis following cerebral infarction affecting left non-dominant side: Secondary | ICD-10-CM | POA: Diagnosis not present

## 2015-01-08 DIAGNOSIS — M25512 Pain in left shoulder: Secondary | ICD-10-CM | POA: Diagnosis not present

## 2015-01-08 DIAGNOSIS — I129 Hypertensive chronic kidney disease with stage 1 through stage 4 chronic kidney disease, or unspecified chronic kidney disease: Secondary | ICD-10-CM | POA: Diagnosis not present

## 2015-01-09 DIAGNOSIS — I482 Chronic atrial fibrillation: Secondary | ICD-10-CM | POA: Diagnosis not present

## 2015-01-09 DIAGNOSIS — Z950 Presence of cardiac pacemaker: Secondary | ICD-10-CM | POA: Diagnosis not present

## 2015-01-09 DIAGNOSIS — I129 Hypertensive chronic kidney disease with stage 1 through stage 4 chronic kidney disease, or unspecified chronic kidney disease: Secondary | ICD-10-CM | POA: Diagnosis not present

## 2015-01-09 DIAGNOSIS — E119 Type 2 diabetes mellitus without complications: Secondary | ICD-10-CM | POA: Diagnosis not present

## 2015-01-09 DIAGNOSIS — I69354 Hemiplegia and hemiparesis following cerebral infarction affecting left non-dominant side: Secondary | ICD-10-CM | POA: Diagnosis not present

## 2015-01-09 DIAGNOSIS — I2510 Atherosclerotic heart disease of native coronary artery without angina pectoris: Secondary | ICD-10-CM | POA: Diagnosis not present

## 2015-01-09 DIAGNOSIS — N183 Chronic kidney disease, stage 3 (moderate): Secondary | ICD-10-CM | POA: Diagnosis not present

## 2015-01-09 DIAGNOSIS — I69320 Aphasia following cerebral infarction: Secondary | ICD-10-CM | POA: Diagnosis not present

## 2015-01-11 ENCOUNTER — Ambulatory Visit: Payer: Commercial Managed Care - HMO

## 2015-01-16 DIAGNOSIS — I69354 Hemiplegia and hemiparesis following cerebral infarction affecting left non-dominant side: Secondary | ICD-10-CM | POA: Diagnosis not present

## 2015-01-16 DIAGNOSIS — I482 Chronic atrial fibrillation: Secondary | ICD-10-CM | POA: Diagnosis not present

## 2015-01-16 DIAGNOSIS — E119 Type 2 diabetes mellitus without complications: Secondary | ICD-10-CM | POA: Diagnosis not present

## 2015-01-16 DIAGNOSIS — Z950 Presence of cardiac pacemaker: Secondary | ICD-10-CM | POA: Diagnosis not present

## 2015-01-16 DIAGNOSIS — I129 Hypertensive chronic kidney disease with stage 1 through stage 4 chronic kidney disease, or unspecified chronic kidney disease: Secondary | ICD-10-CM | POA: Diagnosis not present

## 2015-01-16 DIAGNOSIS — I69320 Aphasia following cerebral infarction: Secondary | ICD-10-CM | POA: Diagnosis not present

## 2015-01-16 DIAGNOSIS — I2510 Atherosclerotic heart disease of native coronary artery without angina pectoris: Secondary | ICD-10-CM | POA: Diagnosis not present

## 2015-01-16 DIAGNOSIS — N183 Chronic kidney disease, stage 3 (moderate): Secondary | ICD-10-CM | POA: Diagnosis not present

## 2015-01-17 DIAGNOSIS — I129 Hypertensive chronic kidney disease with stage 1 through stage 4 chronic kidney disease, or unspecified chronic kidney disease: Secondary | ICD-10-CM | POA: Diagnosis not present

## 2015-01-17 DIAGNOSIS — I2510 Atherosclerotic heart disease of native coronary artery without angina pectoris: Secondary | ICD-10-CM | POA: Diagnosis not present

## 2015-01-17 DIAGNOSIS — N183 Chronic kidney disease, stage 3 (moderate): Secondary | ICD-10-CM | POA: Diagnosis not present

## 2015-01-17 DIAGNOSIS — Z950 Presence of cardiac pacemaker: Secondary | ICD-10-CM | POA: Diagnosis not present

## 2015-01-17 DIAGNOSIS — I482 Chronic atrial fibrillation: Secondary | ICD-10-CM | POA: Diagnosis not present

## 2015-01-17 DIAGNOSIS — I69354 Hemiplegia and hemiparesis following cerebral infarction affecting left non-dominant side: Secondary | ICD-10-CM | POA: Diagnosis not present

## 2015-01-17 DIAGNOSIS — I69320 Aphasia following cerebral infarction: Secondary | ICD-10-CM | POA: Diagnosis not present

## 2015-01-17 DIAGNOSIS — E119 Type 2 diabetes mellitus without complications: Secondary | ICD-10-CM | POA: Diagnosis not present

## 2015-01-18 DIAGNOSIS — R05 Cough: Secondary | ICD-10-CM | POA: Diagnosis not present

## 2015-01-18 DIAGNOSIS — I10 Essential (primary) hypertension: Secondary | ICD-10-CM | POA: Diagnosis not present

## 2015-01-21 DIAGNOSIS — I69354 Hemiplegia and hemiparesis following cerebral infarction affecting left non-dominant side: Secondary | ICD-10-CM | POA: Diagnosis not present

## 2015-01-21 DIAGNOSIS — Z950 Presence of cardiac pacemaker: Secondary | ICD-10-CM | POA: Diagnosis not present

## 2015-01-21 DIAGNOSIS — I69320 Aphasia following cerebral infarction: Secondary | ICD-10-CM | POA: Diagnosis not present

## 2015-01-21 DIAGNOSIS — I482 Chronic atrial fibrillation: Secondary | ICD-10-CM | POA: Diagnosis not present

## 2015-01-21 DIAGNOSIS — I129 Hypertensive chronic kidney disease with stage 1 through stage 4 chronic kidney disease, or unspecified chronic kidney disease: Secondary | ICD-10-CM | POA: Diagnosis not present

## 2015-01-21 DIAGNOSIS — I2510 Atherosclerotic heart disease of native coronary artery without angina pectoris: Secondary | ICD-10-CM | POA: Diagnosis not present

## 2015-01-21 DIAGNOSIS — N183 Chronic kidney disease, stage 3 (moderate): Secondary | ICD-10-CM | POA: Diagnosis not present

## 2015-01-21 DIAGNOSIS — E119 Type 2 diabetes mellitus without complications: Secondary | ICD-10-CM | POA: Diagnosis not present

## 2015-01-24 DIAGNOSIS — I129 Hypertensive chronic kidney disease with stage 1 through stage 4 chronic kidney disease, or unspecified chronic kidney disease: Secondary | ICD-10-CM | POA: Diagnosis not present

## 2015-01-24 DIAGNOSIS — E119 Type 2 diabetes mellitus without complications: Secondary | ICD-10-CM | POA: Diagnosis not present

## 2015-01-24 DIAGNOSIS — I482 Chronic atrial fibrillation: Secondary | ICD-10-CM | POA: Diagnosis not present

## 2015-01-24 DIAGNOSIS — I2510 Atherosclerotic heart disease of native coronary artery without angina pectoris: Secondary | ICD-10-CM | POA: Diagnosis not present

## 2015-01-24 DIAGNOSIS — I69354 Hemiplegia and hemiparesis following cerebral infarction affecting left non-dominant side: Secondary | ICD-10-CM | POA: Diagnosis not present

## 2015-01-24 DIAGNOSIS — Z950 Presence of cardiac pacemaker: Secondary | ICD-10-CM | POA: Diagnosis not present

## 2015-01-24 DIAGNOSIS — N183 Chronic kidney disease, stage 3 (moderate): Secondary | ICD-10-CM | POA: Diagnosis not present

## 2015-01-24 DIAGNOSIS — I69320 Aphasia following cerebral infarction: Secondary | ICD-10-CM | POA: Diagnosis not present

## 2015-01-28 DIAGNOSIS — M12812 Other specific arthropathies, not elsewhere classified, left shoulder: Secondary | ICD-10-CM | POA: Diagnosis not present

## 2015-01-28 DIAGNOSIS — S42225D 2-part nondisplaced fracture of surgical neck of left humerus, subsequent encounter for fracture with routine healing: Secondary | ICD-10-CM | POA: Diagnosis not present

## 2015-01-30 DIAGNOSIS — I482 Chronic atrial fibrillation: Secondary | ICD-10-CM | POA: Diagnosis not present

## 2015-01-30 DIAGNOSIS — N183 Chronic kidney disease, stage 3 (moderate): Secondary | ICD-10-CM | POA: Diagnosis not present

## 2015-01-30 DIAGNOSIS — I69354 Hemiplegia and hemiparesis following cerebral infarction affecting left non-dominant side: Secondary | ICD-10-CM | POA: Diagnosis not present

## 2015-01-30 DIAGNOSIS — I69320 Aphasia following cerebral infarction: Secondary | ICD-10-CM | POA: Diagnosis not present

## 2015-01-30 DIAGNOSIS — I129 Hypertensive chronic kidney disease with stage 1 through stage 4 chronic kidney disease, or unspecified chronic kidney disease: Secondary | ICD-10-CM | POA: Diagnosis not present

## 2015-01-30 DIAGNOSIS — E119 Type 2 diabetes mellitus without complications: Secondary | ICD-10-CM | POA: Diagnosis not present

## 2015-01-30 DIAGNOSIS — I2510 Atherosclerotic heart disease of native coronary artery without angina pectoris: Secondary | ICD-10-CM | POA: Diagnosis not present

## 2015-01-30 DIAGNOSIS — Z950 Presence of cardiac pacemaker: Secondary | ICD-10-CM | POA: Diagnosis not present

## 2015-01-31 DIAGNOSIS — I214 Non-ST elevation (NSTEMI) myocardial infarction: Secondary | ICD-10-CM | POA: Diagnosis not present

## 2015-01-31 DIAGNOSIS — I2510 Atherosclerotic heart disease of native coronary artery without angina pectoris: Secondary | ICD-10-CM | POA: Diagnosis not present

## 2015-01-31 DIAGNOSIS — Z9889 Other specified postprocedural states: Secondary | ICD-10-CM | POA: Diagnosis not present

## 2015-01-31 DIAGNOSIS — Z951 Presence of aortocoronary bypass graft: Secondary | ICD-10-CM | POA: Diagnosis not present

## 2015-01-31 DIAGNOSIS — R479 Unspecified speech disturbances: Secondary | ICD-10-CM | POA: Insufficient documentation

## 2015-02-04 DIAGNOSIS — I129 Hypertensive chronic kidney disease with stage 1 through stage 4 chronic kidney disease, or unspecified chronic kidney disease: Secondary | ICD-10-CM | POA: Diagnosis not present

## 2015-02-04 DIAGNOSIS — Z950 Presence of cardiac pacemaker: Secondary | ICD-10-CM | POA: Diagnosis not present

## 2015-02-04 DIAGNOSIS — I482 Chronic atrial fibrillation: Secondary | ICD-10-CM | POA: Diagnosis not present

## 2015-02-04 DIAGNOSIS — I2510 Atherosclerotic heart disease of native coronary artery without angina pectoris: Secondary | ICD-10-CM | POA: Diagnosis not present

## 2015-02-04 DIAGNOSIS — I69320 Aphasia following cerebral infarction: Secondary | ICD-10-CM | POA: Diagnosis not present

## 2015-02-04 DIAGNOSIS — E119 Type 2 diabetes mellitus without complications: Secondary | ICD-10-CM | POA: Diagnosis not present

## 2015-02-04 DIAGNOSIS — N183 Chronic kidney disease, stage 3 (moderate): Secondary | ICD-10-CM | POA: Diagnosis not present

## 2015-02-04 DIAGNOSIS — I69354 Hemiplegia and hemiparesis following cerebral infarction affecting left non-dominant side: Secondary | ICD-10-CM | POA: Diagnosis not present

## 2015-02-07 DIAGNOSIS — E119 Type 2 diabetes mellitus without complications: Secondary | ICD-10-CM | POA: Diagnosis not present

## 2015-02-07 DIAGNOSIS — Z950 Presence of cardiac pacemaker: Secondary | ICD-10-CM | POA: Diagnosis not present

## 2015-02-07 DIAGNOSIS — I69354 Hemiplegia and hemiparesis following cerebral infarction affecting left non-dominant side: Secondary | ICD-10-CM | POA: Diagnosis not present

## 2015-02-07 DIAGNOSIS — N183 Chronic kidney disease, stage 3 (moderate): Secondary | ICD-10-CM | POA: Diagnosis not present

## 2015-02-07 DIAGNOSIS — I69320 Aphasia following cerebral infarction: Secondary | ICD-10-CM | POA: Diagnosis not present

## 2015-02-07 DIAGNOSIS — I129 Hypertensive chronic kidney disease with stage 1 through stage 4 chronic kidney disease, or unspecified chronic kidney disease: Secondary | ICD-10-CM | POA: Diagnosis not present

## 2015-02-07 DIAGNOSIS — I2510 Atherosclerotic heart disease of native coronary artery without angina pectoris: Secondary | ICD-10-CM | POA: Diagnosis not present

## 2015-02-07 DIAGNOSIS — I482 Chronic atrial fibrillation: Secondary | ICD-10-CM | POA: Diagnosis not present

## 2015-02-11 DIAGNOSIS — I129 Hypertensive chronic kidney disease with stage 1 through stage 4 chronic kidney disease, or unspecified chronic kidney disease: Secondary | ICD-10-CM | POA: Diagnosis not present

## 2015-02-11 DIAGNOSIS — Z950 Presence of cardiac pacemaker: Secondary | ICD-10-CM | POA: Diagnosis not present

## 2015-02-11 DIAGNOSIS — I2510 Atherosclerotic heart disease of native coronary artery without angina pectoris: Secondary | ICD-10-CM | POA: Diagnosis not present

## 2015-02-11 DIAGNOSIS — I69354 Hemiplegia and hemiparesis following cerebral infarction affecting left non-dominant side: Secondary | ICD-10-CM | POA: Diagnosis not present

## 2015-02-11 DIAGNOSIS — I69320 Aphasia following cerebral infarction: Secondary | ICD-10-CM | POA: Diagnosis not present

## 2015-02-11 DIAGNOSIS — E119 Type 2 diabetes mellitus without complications: Secondary | ICD-10-CM | POA: Diagnosis not present

## 2015-02-11 DIAGNOSIS — N183 Chronic kidney disease, stage 3 (moderate): Secondary | ICD-10-CM | POA: Diagnosis not present

## 2015-02-11 DIAGNOSIS — I482 Chronic atrial fibrillation: Secondary | ICD-10-CM | POA: Diagnosis not present

## 2015-02-14 DIAGNOSIS — I129 Hypertensive chronic kidney disease with stage 1 through stage 4 chronic kidney disease, or unspecified chronic kidney disease: Secondary | ICD-10-CM | POA: Diagnosis not present

## 2015-02-14 DIAGNOSIS — I69320 Aphasia following cerebral infarction: Secondary | ICD-10-CM | POA: Diagnosis not present

## 2015-02-14 DIAGNOSIS — N183 Chronic kidney disease, stage 3 (moderate): Secondary | ICD-10-CM | POA: Diagnosis not present

## 2015-02-14 DIAGNOSIS — I482 Chronic atrial fibrillation: Secondary | ICD-10-CM | POA: Diagnosis not present

## 2015-02-14 DIAGNOSIS — I69354 Hemiplegia and hemiparesis following cerebral infarction affecting left non-dominant side: Secondary | ICD-10-CM | POA: Diagnosis not present

## 2015-02-14 DIAGNOSIS — Z950 Presence of cardiac pacemaker: Secondary | ICD-10-CM | POA: Diagnosis not present

## 2015-02-14 DIAGNOSIS — E119 Type 2 diabetes mellitus without complications: Secondary | ICD-10-CM | POA: Diagnosis not present

## 2015-02-14 DIAGNOSIS — I2510 Atherosclerotic heart disease of native coronary artery without angina pectoris: Secondary | ICD-10-CM | POA: Diagnosis not present

## 2015-02-18 DIAGNOSIS — M12812 Other specific arthropathies, not elsewhere classified, left shoulder: Secondary | ICD-10-CM | POA: Diagnosis not present

## 2015-02-18 DIAGNOSIS — S42225D 2-part nondisplaced fracture of surgical neck of left humerus, subsequent encounter for fracture with routine healing: Secondary | ICD-10-CM | POA: Diagnosis not present

## 2015-03-20 ENCOUNTER — Ambulatory Visit: Payer: Commercial Managed Care - HMO

## 2015-03-20 DIAGNOSIS — E119 Type 2 diabetes mellitus without complications: Secondary | ICD-10-CM | POA: Diagnosis not present

## 2015-03-20 DIAGNOSIS — E780 Pure hypercholesterolemia: Secondary | ICD-10-CM | POA: Diagnosis not present

## 2015-03-20 DIAGNOSIS — B353 Tinea pedis: Secondary | ICD-10-CM | POA: Diagnosis not present

## 2015-03-20 DIAGNOSIS — Z79899 Other long term (current) drug therapy: Secondary | ICD-10-CM | POA: Diagnosis not present

## 2015-03-20 DIAGNOSIS — L299 Pruritus, unspecified: Secondary | ICD-10-CM | POA: Diagnosis not present

## 2015-03-27 ENCOUNTER — Ambulatory Visit: Payer: Commercial Managed Care - HMO

## 2015-04-02 DIAGNOSIS — R42 Dizziness and giddiness: Secondary | ICD-10-CM | POA: Diagnosis not present

## 2015-04-02 DIAGNOSIS — E780 Pure hypercholesterolemia: Secondary | ICD-10-CM | POA: Diagnosis not present

## 2015-04-02 DIAGNOSIS — N183 Chronic kidney disease, stage 3 (moderate): Secondary | ICD-10-CM | POA: Diagnosis not present

## 2015-04-02 DIAGNOSIS — Z0000 Encounter for general adult medical examination without abnormal findings: Secondary | ICD-10-CM | POA: Diagnosis not present

## 2015-04-02 DIAGNOSIS — E119 Type 2 diabetes mellitus without complications: Secondary | ICD-10-CM | POA: Diagnosis not present

## 2015-04-02 DIAGNOSIS — Z79899 Other long term (current) drug therapy: Secondary | ICD-10-CM | POA: Diagnosis not present

## 2015-04-04 ENCOUNTER — Ambulatory Visit (INDEPENDENT_AMBULATORY_CARE_PROVIDER_SITE_OTHER): Payer: Commercial Managed Care - HMO | Admitting: Podiatry

## 2015-04-04 DIAGNOSIS — B351 Tinea unguium: Secondary | ICD-10-CM

## 2015-04-04 DIAGNOSIS — M79676 Pain in unspecified toe(s): Secondary | ICD-10-CM | POA: Diagnosis not present

## 2015-04-04 NOTE — Progress Notes (Signed)
Subjective: 81 y.o. returns the office today for painful, elongated, thickened toenails which she is unable to trim herself. Denies any redness or drainage around the nails. Denies any acute changes since last appointment and no new complaints today. Denies any systemic complaints such as fevers, chills, nausea, vomiting.   Objective: AAO 3, NAD DP/PT pulses palpable, CRT less than 3 seconds  Nails hypertrophic, dystrophic, elongated, brittle, discolored 10. There is tenderness overlying the nails 1-5 bilaterally. There is no surrounding erythema or drainage along the nail sites. No open lesions or pre-ulcerative lesions are identified. No other areas of tenderness bilateral lower extremities. No overlying edema, erythema, increased warmth. Hammertoe contractures present.  No pain with calf compression, swelling, warmth, erythema.  Assessment: Patient presents with symptomatic onychomycosis  Plan: -Treatment options including alternatives, risks, complications were discussed -Nails sharply debrided 10 without complication/bleeding. -Discussed daily foot inspection. If there are any changes, to call the office immediately.  -Follow-up in 3 months or sooner if any problems are to arise. In the meantime, encouraged to call the office with any questions, concerns, changes symptoms.   Ovid Curd, DPM

## 2015-04-11 DIAGNOSIS — E780 Pure hypercholesterolemia: Secondary | ICD-10-CM | POA: Diagnosis not present

## 2015-04-11 DIAGNOSIS — I214 Non-ST elevation (NSTEMI) myocardial infarction: Secondary | ICD-10-CM | POA: Diagnosis not present

## 2015-04-11 DIAGNOSIS — G459 Transient cerebral ischemic attack, unspecified: Secondary | ICD-10-CM | POA: Diagnosis not present

## 2015-04-11 DIAGNOSIS — I2510 Atherosclerotic heart disease of native coronary artery without angina pectoris: Secondary | ICD-10-CM | POA: Diagnosis not present

## 2015-04-11 DIAGNOSIS — Z9889 Other specified postprocedural states: Secondary | ICD-10-CM | POA: Diagnosis not present

## 2015-04-11 DIAGNOSIS — I4891 Unspecified atrial fibrillation: Secondary | ICD-10-CM | POA: Diagnosis not present

## 2015-04-11 DIAGNOSIS — Z951 Presence of aortocoronary bypass graft: Secondary | ICD-10-CM | POA: Diagnosis not present

## 2015-04-11 DIAGNOSIS — Z950 Presence of cardiac pacemaker: Secondary | ICD-10-CM | POA: Diagnosis not present

## 2015-04-22 DIAGNOSIS — R0609 Other forms of dyspnea: Secondary | ICD-10-CM | POA: Diagnosis not present

## 2015-05-08 DIAGNOSIS — R05 Cough: Secondary | ICD-10-CM | POA: Diagnosis not present

## 2015-05-08 DIAGNOSIS — F458 Other somatoform disorders: Secondary | ICD-10-CM | POA: Diagnosis not present

## 2015-06-11 DIAGNOSIS — L710 Perioral dermatitis: Secondary | ICD-10-CM | POA: Diagnosis not present

## 2015-06-11 DIAGNOSIS — B353 Tinea pedis: Secondary | ICD-10-CM | POA: Diagnosis not present

## 2015-06-25 ENCOUNTER — Ambulatory Visit (INDEPENDENT_AMBULATORY_CARE_PROVIDER_SITE_OTHER): Payer: Commercial Managed Care - HMO | Admitting: Sports Medicine

## 2015-06-25 ENCOUNTER — Encounter: Payer: Self-pay | Admitting: Sports Medicine

## 2015-06-25 DIAGNOSIS — E0840 Diabetes mellitus due to underlying condition with diabetic neuropathy, unspecified: Secondary | ICD-10-CM | POA: Diagnosis not present

## 2015-06-25 DIAGNOSIS — M79676 Pain in unspecified toe(s): Secondary | ICD-10-CM

## 2015-06-25 DIAGNOSIS — B351 Tinea unguium: Secondary | ICD-10-CM

## 2015-06-25 NOTE — Progress Notes (Signed)
Patient ID: Veronica Short, female   DOB: 08-Jan-1924, 81 y.o.   MRN: [id number] Subjective: Veronica Short is a 81 y.o. female patient with history of type 2 diabetes who presents to office today complaining of long, painful nails  while ambulating in shoes; unable to trim. Patient states that the glucose reading this morning was not recorded. Patient denies any new changes in medication or new problems. Patient denies any new cramping, numbness, burning or tingling in the legs.  Patient Active Problem List   Diagnosis Date Noted  . TIA (transient ischemic attack) 01/03/2015  . CAD (coronary artery disease) 01/03/2015  . Diabetes mellitus without complication (HCC) 01/03/2015  . Hypertension 01/03/2015  . High cholesterol 01/03/2015  . Cerebral thrombosis with cerebral infarction (HCC) 01/03/2015  . Atrial fibrillation, unspecified   . Speech disturbance    Current Outpatient Prescriptions on File Prior to Visit  Medication Sig Dispense Refill  . acetaminophen (TYLENOL) 500 MG tablet Take 1,000 mg by mouth every 6 (six) hours as needed for mild pain.    Marland Kitchen apixaban (ELIQUIS) 5 MG TABS tablet Take 1 tablet (5 mg total) by mouth 2 (two) times daily. 60 tablet 1  . atenolol (TENORMIN) 100 MG tablet Take 100 mg by mouth daily.     Marland Kitchen atorvastatin (LIPITOR) 40 MG tablet Take 1 tablet (40 mg total) by mouth at bedtime. 30 tablet 1  . CALCIUM PO Take 2 tablets by mouth 2 (two) times daily.    . Cholecalciferol (VITAMIN D PO) Take 1 tablet by mouth every morning.    . citalopram (CELEXA) 20 MG tablet Take 20 mg by mouth daily.     . clopidogrel (PLAVIX) 75 MG tablet Take 75 mg by mouth daily.     Marland Kitchen diltiazem (CARDIZEM CD) 180 MG 24 hr capsule Take 180 mg by mouth daily.     . fluticasone (FLONASE) 50 MCG/ACT nasal spray Place 2 sprays into both nostrils daily.    Marland Kitchen lisinopril (PRINIVIL) 10 MG tablet Take 1 tablet (10 mg total) by mouth daily. 30 tablet 1  . metFORMIN (GLUCOPHAGE-XR) 500 MG 24 hr  tablet Take 500 mg by mouth daily with breakfast.     . nitroGLYCERIN (NITROSTAT) 0.4 MG SL tablet Place 0.4 mg under the tongue every 5 (five) minutes as needed for chest pain.    Marland Kitchen Olopatadine HCl (PATADAY) 0.2 % SOLN Place 2 drops into both eyes every morning.    Marland Kitchen omeprazole (PRILOSEC) 40 MG capsule Take 1 capsule (40 mg total) by mouth every morning. 30 capsule 1  . traMADol (ULTRAM) 50 MG tablet Take 50 mg by mouth every 6 (six) hours as needed for moderate pain.    Marland Kitchen VITAMIN E PO Take 1 tablet by mouth every morning.     No current facility-administered medications on file prior to visit.   Allergies  Allergen Reactions  . Morphine Anxiety    NUMBNESS  . Penicillins Hives and Rash   Labs: HEMOGLOBIN A1C- No recent lab on file FBS- Not recorded  Objective: General: Patient is awake, alert, and oriented x 3 and in no acute distress.  Integument: Skin is warm, dry and supple bilateral. Nails are tender, long, thickened and  dystrophic with subungual debris, consistent with onychomycosis, 1-5 bilateral. No signs of infection. No open lesions or preulcerative lesions present bilateral. Remaining integument unremarkable.  Vasculature:  Dorsalis Pedis pulse 2/4 bilateral. Posterior Tibial pulse  1/4 bilateral.  Capillary fill time <3 sec 1-5  bilateral. Positive hair growth to the level of the digits. Temperature gradient within normal limits. No varicosities present bilateral. No edema present bilateral.   Neurology: The patient has slightly diminished sensation measured with a 5.07/10g Semmes Weinstein Monofilament at all pedal sites bilateral . Vibratory sensation diminished bilateral with tuning fork. No Babinski sign present bilateral.   Musculoskeletal: Asymptomatic hammertoe pedal deformities noted bilateral. Muscular strength 5/5 in all lower extremity muscular groups bilateral without pain or limitation on range of motion . No tenderness with calf compression  bilateral.  Assessment and Plan: Problem List Items Addressed This Visit    None    Visit Diagnoses    Dermatophytosis of nail    -  Primary    Pain of toe, unspecified laterality        Diabetes mellitus due to underlying condition with diabetic neuropathy, without long-term current use of insulin (HCC)        FBS not recorded by patient      -Examined patient. -Discussed and educated patient on diabetic foot care, especially with  regards to the vascular, neurological and musculoskeletal systems.  -Stressed the importance of good glycemic control and the detriment of not  controlling glucose levels in relation to the foot. -Mechanically debrided all nails 1-5 bilateral using sterile nail nipper and filed with dremel without incident  -Recommend good supportive diabetic shoes daily -Answered all patient questions -Patient to return in 3 months for at risk foot care -Patient advised to call the office if any problems or questions arise in the  Meantime.  Veronica Short, DPM

## 2015-08-22 DIAGNOSIS — Z85828 Personal history of other malignant neoplasm of skin: Secondary | ICD-10-CM | POA: Diagnosis not present

## 2015-08-22 DIAGNOSIS — L710 Perioral dermatitis: Secondary | ICD-10-CM | POA: Diagnosis not present

## 2015-09-06 DIAGNOSIS — N183 Chronic kidney disease, stage 3 (moderate): Secondary | ICD-10-CM | POA: Diagnosis not present

## 2015-09-06 DIAGNOSIS — E119 Type 2 diabetes mellitus without complications: Secondary | ICD-10-CM | POA: Diagnosis not present

## 2015-09-06 DIAGNOSIS — Z79899 Other long term (current) drug therapy: Secondary | ICD-10-CM | POA: Diagnosis not present

## 2015-09-13 ENCOUNTER — Other Ambulatory Visit: Payer: Self-pay | Admitting: Family Medicine

## 2015-09-13 DIAGNOSIS — R140 Abdominal distension (gaseous): Secondary | ICD-10-CM

## 2015-09-13 DIAGNOSIS — M21612 Bunion of left foot: Secondary | ICD-10-CM | POA: Diagnosis not present

## 2015-09-13 DIAGNOSIS — M21611 Bunion of right foot: Secondary | ICD-10-CM | POA: Diagnosis not present

## 2015-09-13 DIAGNOSIS — E7800 Pure hypercholesterolemia, unspecified: Secondary | ICD-10-CM | POA: Diagnosis not present

## 2015-09-13 DIAGNOSIS — Z79899 Other long term (current) drug therapy: Secondary | ICD-10-CM | POA: Diagnosis not present

## 2015-09-13 DIAGNOSIS — I482 Chronic atrial fibrillation: Secondary | ICD-10-CM | POA: Diagnosis not present

## 2015-09-13 DIAGNOSIS — E119 Type 2 diabetes mellitus without complications: Secondary | ICD-10-CM | POA: Diagnosis not present

## 2015-09-13 DIAGNOSIS — I10 Essential (primary) hypertension: Secondary | ICD-10-CM | POA: Diagnosis not present

## 2015-09-17 ENCOUNTER — Ambulatory Visit
Admission: RE | Admit: 2015-09-17 | Discharge: 2015-09-17 | Disposition: A | Discharge status: Home / Self Care | Payer: Commercial Managed Care - HMO | Source: Ambulatory Visit | Attending: Family Medicine | Admitting: Family Medicine

## 2015-09-17 DIAGNOSIS — N281 Cyst of kidney, acquired: Secondary | ICD-10-CM | POA: Diagnosis not present

## 2015-09-17 DIAGNOSIS — R140 Abdominal distension (gaseous): Secondary | ICD-10-CM | POA: Diagnosis not present

## 2015-09-17 DIAGNOSIS — N261 Atrophy of kidney (terminal): Secondary | ICD-10-CM | POA: Insufficient documentation

## 2015-09-17 DIAGNOSIS — K760 Fatty (change of) liver, not elsewhere classified: Secondary | ICD-10-CM | POA: Diagnosis not present

## 2015-09-17 DIAGNOSIS — Z9049 Acquired absence of other specified parts of digestive tract: Secondary | ICD-10-CM | POA: Insufficient documentation

## 2015-09-24 ENCOUNTER — Ambulatory Visit: Payer: Commercial Managed Care - HMO | Admitting: Sports Medicine

## 2015-09-24 DIAGNOSIS — I482 Chronic atrial fibrillation: Secondary | ICD-10-CM | POA: Diagnosis not present

## 2015-09-25 DIAGNOSIS — G453 Amaurosis fugax: Secondary | ICD-10-CM | POA: Diagnosis not present

## 2015-10-01 ENCOUNTER — Ambulatory Visit (INDEPENDENT_AMBULATORY_CARE_PROVIDER_SITE_OTHER): Payer: Commercial Managed Care - HMO | Admitting: Sports Medicine

## 2015-10-01 ENCOUNTER — Encounter: Payer: Self-pay | Admitting: Sports Medicine

## 2015-10-01 DIAGNOSIS — B351 Tinea unguium: Secondary | ICD-10-CM | POA: Diagnosis not present

## 2015-10-01 DIAGNOSIS — E0840 Diabetes mellitus due to underlying condition with diabetic neuropathy, unspecified: Secondary | ICD-10-CM

## 2015-10-01 DIAGNOSIS — M79676 Pain in unspecified toe(s): Secondary | ICD-10-CM

## 2015-10-01 NOTE — Progress Notes (Signed)
Patient ID: Veronica Short, female   DOB: Jan 13, 1924, 82 y.o.   MRN: [id number] Subjective: Veronica Short is a 82 y.o. female patient with history of type 2 diabetes who presents to office today complaining of long, painful nails  while ambulating in shoes; unable to trim. Patient states that the glucose reading this morning was not recorded. Patient denies any new changes in medication or new problems. Patient denies any new cramping, numbness, burning or tingling in the legs.  Patient Active Problem List   Diagnosis Date Noted  . Speech disorder 01/31/2015  . Closed fracture of humerus, surgical neck 01/07/2015  . Rotator cuff arthropathy 01/07/2015  . TIA (transient ischemic attack) 01/03/2015  . CAD (coronary artery disease) 01/03/2015  . Diabetes mellitus without complication (HCC) 01/03/2015  . Hypertension 01/03/2015  . High cholesterol 01/03/2015  . Cerebral thrombosis with cerebral infarction (HCC) 01/03/2015  . Cerebral infarction due to thrombosis of cerebral artery (HCC) 01/03/2015  . Temporary cerebral vascular dysfunction 01/03/2015  . Atrial fibrillation, unspecified   . Speech disturbance   . Arthritis, degenerative 03/23/2014  . Aortic valve defect 11/20/2013  . CAD in native artery 11/20/2013  . Degeneration of intervertebral disc of lumbosacral region 11/20/2013  . Adiposity 11/20/2013  . OP (osteoporosis) 11/20/2013  . Diabetes mellitus (HCC) 11/20/2013  . Atrial fibrillation (HCC) 11/01/2013  . HLD (hyperlipidemia) 11/01/2013  . H/O coronary artery bypass surgery 11/01/2013  . Artificial cardiac pacemaker 02/22/2012  . History of cardiac catheterization 11/10/1993  . Myocardial infarction (HCC) 01/15/1991   Current Outpatient Prescriptions on File Prior to Visit  Medication Sig Dispense Refill  . acetaminophen (TYLENOL) 500 MG tablet Take 1,000 mg by mouth every 6 (six) hours as needed for mild pain.    Marland Kitchen apixaban (ELIQUIS) 5 MG TABS tablet Take 1 tablet (5  mg total) by mouth 2 (two) times daily. 60 tablet 1  . atenolol (TENORMIN) 100 MG tablet Take 100 mg by mouth daily.     Marland Kitchen atorvastatin (LIPITOR) 40 MG tablet Take 1 tablet (40 mg total) by mouth at bedtime. 30 tablet 1  . CALCIUM PO Take 2 tablets by mouth 2 (two) times daily.    . Cholecalciferol (VITAMIN D PO) Take 1 tablet by mouth every morning.    . citalopram (CELEXA) 20 MG tablet Take 20 mg by mouth daily.     . clopidogrel (PLAVIX) 75 MG tablet Take 75 mg by mouth daily.     Marland Kitchen diltiazem (CARDIZEM CD) 180 MG 24 hr capsule Take 180 mg by mouth daily.     . fluticasone (FLONASE) 50 MCG/ACT nasal spray Place 2 sprays into both nostrils daily.    Marland Kitchen lisinopril (PRINIVIL) 10 MG tablet Take 1 tablet (10 mg total) by mouth daily. 30 tablet 1  . metFORMIN (GLUCOPHAGE-XR) 500 MG 24 hr tablet Take 500 mg by mouth daily with breakfast.     . nitroGLYCERIN (NITROSTAT) 0.4 MG SL tablet Place 0.4 mg under the tongue every 5 (five) minutes as needed for chest pain.    Marland Kitchen Olopatadine HCl (PATADAY) 0.2 % SOLN Place 2 drops into both eyes every morning.    Marland Kitchen omeprazole (PRILOSEC) 40 MG capsule Take 1 capsule (40 mg total) by mouth every morning. 30 capsule 1  . traMADol (ULTRAM) 50 MG tablet Take 50 mg by mouth every 6 (six) hours as needed for moderate pain.    Marland Kitchen VITAMIN E PO Take 1 tablet by mouth every morning.  No current facility-administered medications on file prior to visit.   Allergies  Allergen Reactions  . Morphine Anxiety    NUMBNESS  . Penicillins Hives and Rash    Objective: General: Patient is awake, alert, and oriented x 3 and in no acute distress.  Integument: Skin is warm, dry and supple bilateral. Nails are tender, long, thickened and  dystrophic with subungual debris, consistent with onychomycosis, 1-5 bilateral. No signs of infection. No open lesions or preulcerative lesions present bilateral. Remaining integument unremarkable.  Vasculature:  Dorsalis Pedis pulse 2/4  bilateral. Posterior Tibial pulse  1/4 bilateral.  Capillary fill time <3 sec 1-5 bilateral. Positive hair growth to the level of the digits. Temperature gradient within normal limits. No varicosities present bilateral. No edema present bilateral.   Neurology: The patient has slightly diminished sensation measured with a 5.07/10g Semmes Weinstein Monofilament at all pedal sites bilateral. Vibratory sensation diminished bilateral with tuning fork. No Babinski sign present bilateral.   Musculoskeletal: Asymptomatic hammertoe pedal deformities and Pes planus end-stage noted bilateral. Muscular strength 5/5 in all lower extremity muscular groups bilateral without pain or limitation on range of motion . No tenderness with calf compression bilateral.  Assessment and Plan: Problem List Items Addressed This Visit      Endocrine   Diabetes mellitus (HCC)    Other Visit Diagnoses    Dermatophytosis of nail    -  Primary    Pain of toe, unspecified laterality          -Examined patient. -Discussed and educated patient on diabetic foot care, especially with  regards to the vascular, neurological and musculoskeletal systems.  -Stressed the importance of good glycemic control and the detriment of not  controlling glucose levels in relation to the foot. -Mechanically debrided all nails 1-5 bilateral using sterile nail nipper and filed with dremel without incident  -Recommend good supportive diabetic shoes daily -Recommend fungi nail OTC and icy hot with lidocaine as needed for pain to feet -Answered all patient questions -Patient to return in 3 months for at risk foot care -Patient advised to call the office if any problems or questions arise in the meantime.  Asencion Islamitorya Adley Castello, DPM

## 2015-10-01 NOTE — Patient Instructions (Signed)
Icy hot with lidocaine for pain Fungi nail solution for nails

## 2015-10-23 DIAGNOSIS — R05 Cough: Secondary | ICD-10-CM | POA: Diagnosis not present

## 2015-10-23 DIAGNOSIS — M25512 Pain in left shoulder: Secondary | ICD-10-CM | POA: Diagnosis not present

## 2015-10-23 DIAGNOSIS — M545 Low back pain: Secondary | ICD-10-CM | POA: Diagnosis not present

## 2015-10-23 DIAGNOSIS — M47816 Spondylosis without myelopathy or radiculopathy, lumbar region: Secondary | ICD-10-CM | POA: Diagnosis not present

## 2015-10-27 ENCOUNTER — Emergency Department: Payer: Commercial Managed Care - HMO

## 2015-10-27 ENCOUNTER — Emergency Department
Admission: EM | Admit: 2015-10-27 | Discharge: 2015-10-27 | Disposition: A | Discharge status: Home / Self Care | Payer: Commercial Managed Care - HMO | Attending: Emergency Medicine | Admitting: Emergency Medicine

## 2015-10-27 ENCOUNTER — Encounter: Payer: Self-pay | Admitting: Emergency Medicine

## 2015-10-27 DIAGNOSIS — M1990 Unspecified osteoarthritis, unspecified site: Secondary | ICD-10-CM | POA: Insufficient documentation

## 2015-10-27 DIAGNOSIS — I252 Old myocardial infarction: Secondary | ICD-10-CM | POA: Insufficient documentation

## 2015-10-27 DIAGNOSIS — R0602 Shortness of breath: Secondary | ICD-10-CM

## 2015-10-27 DIAGNOSIS — Z950 Presence of cardiac pacemaker: Secondary | ICD-10-CM | POA: Insufficient documentation

## 2015-10-27 DIAGNOSIS — Z7984 Long term (current) use of oral hypoglycemic drugs: Secondary | ICD-10-CM | POA: Diagnosis not present

## 2015-10-27 DIAGNOSIS — E785 Hyperlipidemia, unspecified: Secondary | ICD-10-CM | POA: Insufficient documentation

## 2015-10-27 DIAGNOSIS — N390 Urinary tract infection, site not specified: Secondary | ICD-10-CM | POA: Insufficient documentation

## 2015-10-27 DIAGNOSIS — I2510 Atherosclerotic heart disease of native coronary artery without angina pectoris: Secondary | ICD-10-CM | POA: Insufficient documentation

## 2015-10-27 DIAGNOSIS — E119 Type 2 diabetes mellitus without complications: Secondary | ICD-10-CM | POA: Insufficient documentation

## 2015-10-27 DIAGNOSIS — I110 Hypertensive heart disease with heart failure: Secondary | ICD-10-CM | POA: Diagnosis not present

## 2015-10-27 DIAGNOSIS — I509 Heart failure, unspecified: Secondary | ICD-10-CM | POA: Insufficient documentation

## 2015-10-27 DIAGNOSIS — Z951 Presence of aortocoronary bypass graft: Secondary | ICD-10-CM | POA: Diagnosis not present

## 2015-10-27 DIAGNOSIS — J9801 Acute bronchospasm: Secondary | ICD-10-CM

## 2015-10-27 DIAGNOSIS — G459 Transient cerebral ischemic attack, unspecified: Secondary | ICD-10-CM | POA: Diagnosis not present

## 2015-10-27 DIAGNOSIS — J209 Acute bronchitis, unspecified: Secondary | ICD-10-CM

## 2015-10-27 DIAGNOSIS — I4891 Unspecified atrial fibrillation: Secondary | ICD-10-CM | POA: Insufficient documentation

## 2015-10-27 DIAGNOSIS — R05 Cough: Secondary | ICD-10-CM

## 2015-10-27 DIAGNOSIS — Z9049 Acquired absence of other specified parts of digestive tract: Secondary | ICD-10-CM | POA: Insufficient documentation

## 2015-10-27 DIAGNOSIS — R509 Fever, unspecified: Secondary | ICD-10-CM | POA: Diagnosis not present

## 2015-10-27 DIAGNOSIS — R059 Cough, unspecified: Secondary | ICD-10-CM

## 2015-10-27 HISTORY — DX: Heart failure, unspecified: I50.9

## 2015-10-27 LAB — COMPREHENSIVE METABOLIC PANEL
ALBUMIN: 4.1 g/dL (ref 3.5–5.0)
ALK PHOS: 105 U/L (ref 38–126)
ALT: 15 U/L (ref 14–54)
AST: 22 U/L (ref 15–41)
Anion gap: 7 (ref 5–15)
BILIRUBIN TOTAL: 0.7 mg/dL (ref 0.3–1.2)
BUN: 25 mg/dL — AB (ref 6–20)
CALCIUM: 9.4 mg/dL (ref 8.9–10.3)
CO2: 27 mmol/L (ref 22–32)
Chloride: 100 mmol/L — ABNORMAL LOW (ref 101–111)
Creatinine, Ser: 1.1 mg/dL — ABNORMAL HIGH (ref 0.44–1.00)
GFR calc Af Amer: 50 mL/min — ABNORMAL LOW (ref >60)
GFR, EST NON AFRICAN AMERICAN: 43 mL/min — AB (ref >60)
GLUCOSE: 130 mg/dL — AB (ref 65–99)
Potassium: 4.2 mmol/L (ref 3.5–5.1)
Sodium: 134 mmol/L — ABNORMAL LOW (ref 135–145)
TOTAL PROTEIN: 7.6 g/dL (ref 6.5–8.1)

## 2015-10-27 LAB — CBC WITH DIFFERENTIAL/PLATELET
BASOS PCT: 1 %
Basophils Absolute: 0 10*3/uL (ref 0–0.1)
Eosinophils Absolute: 0.2 10*3/uL (ref 0–0.7)
Eosinophils Relative: 3 %
HEMATOCRIT: 32.4 % — AB (ref 35.0–47.0)
HEMOGLOBIN: 10.9 g/dL — AB (ref 12.0–16.0)
LYMPHS PCT: 22 %
Lymphs Abs: 1.5 10*3/uL (ref 1.0–3.6)
MCH: 29 pg (ref 26.0–34.0)
MCHC: 33.7 g/dL (ref 32.0–36.0)
MCV: 86.2 fL (ref 80.0–100.0)
MONOS PCT: 12 %
Monocytes Absolute: 0.8 10*3/uL (ref 0.2–0.9)
NEUTROS ABS: 4.4 10*3/uL (ref 1.4–6.5)
NEUTROS PCT: 64 %
Platelets: 221 10*3/uL (ref 150–440)
RBC: 3.76 MIL/uL — ABNORMAL LOW (ref 3.80–5.20)
RDW: 13.2 % (ref 11.5–14.5)
WBC: 6.9 10*3/uL (ref 3.6–11.0)

## 2015-10-27 LAB — URINALYSIS COMPLETE WITH MICROSCOPIC (ARMC ONLY)
BILIRUBIN URINE: NEGATIVE
GLUCOSE, UA: NEGATIVE mg/dL
Hgb urine dipstick: NEGATIVE
KETONES UR: NEGATIVE mg/dL
Nitrite: NEGATIVE
PH: 6 (ref 5.0–8.0)
PROTEIN: NEGATIVE mg/dL
SPECIFIC GRAVITY, URINE: 1.008 (ref 1.005–1.030)

## 2015-10-27 LAB — BRAIN NATRIURETIC PEPTIDE: B Natriuretic Peptide: 546 pg/mL — ABNORMAL HIGH (ref 0.0–100.0)

## 2015-10-27 LAB — TROPONIN I: Troponin I: <0.03 ng/mL (ref <0.031)

## 2015-10-27 MED ORDER — LEVOFLOXACIN 750 MG PO TABS
750.0000 mg | ORAL_TABLET | Freq: Once | ORAL | Status: AC
  Administered 2015-10-27: 750 mg via ORAL
  Filled 2015-10-27: qty 1

## 2015-10-27 MED ORDER — LEVOFLOXACIN 750 MG PO TABS: 750.0000 mg | ORAL_TABLET | Freq: Every day | ORAL | Status: AC

## 2015-10-27 MED ORDER — ALBUTEROL SULFATE HFA 108 (90 BASE) MCG/ACT IN AERS: 2.0000 | INHALATION_SPRAY | Freq: Four times a day (QID) | RESPIRATORY_TRACT | Status: AC | PRN

## 2015-10-27 NOTE — ED Provider Notes (Signed)
[facility] Emergency Department Provider Note ___________________________________________  Time seen: Approximately 9:00 AM  I have reviewed the triage vital signs and the nursing notes.   HISTORY  Chief Complaint Cough and Fever  HPI Veronica Short is a 82 y.o. female    Past Medical History  Diagnosis Date  . Hypertension   . Diabetes mellitus without complication (HCC)   . High cholesterol   . Arthritis   . CAD (coronary artery disease)   . CHF (congestive heart failure) [facility](HCC)     Patient Active Problem List   Diagnosis Date Noted  . Speech disorder 01/31/2015  . Closed fracture of humerus, surgical neck 01/07/2015  . Rotator cuff arthropathy 01/07/2015  . TIA (transient ischemic attack) 01/03/2015  . CAD (coronary artery disease) 01/03/2015  . Diabetes mellitus without complication (HCC) 01/03/2015  . Hypertension 01/03/2015  . High cholesterol 01/03/2015  . Cerebral thrombosis with cerebral infarction (HCC) 01/03/2015  . Cerebral infarction due to thrombosis of cerebral artery (HCC) 01/03/2015  . Temporary cerebral vascular dysfunction 01/03/2015  . Atrial fibrillation, unspecified   . Speech disturbance   . Arthritis, degenerative 03/23/2014  . Aortic valve defect 11/20/2013  . CAD in native artery 11/20/2013  . Degeneration of intervertebral disc of lumbosacral region 11/20/2013  . Adiposity 11/20/2013  . OP (osteoporosis) 11/20/2013  . Diabetes mellitus (HCC) 11/20/2013  . Atrial fibrillation (HCC) 11/01/2013  . HLD (hyperlipidemia) 11/01/2013  . H/O coronary artery bypass surgery 11/01/2013  . Artificial cardiac pacemaker 02/22/2012  . History of cardiac catheterization 11/10/1993  . Myocardial infarction [facility](HCC) 01/15/1991    Past Surgical History  Procedure Laterality Date  . Pacemaker insertion    . Coronary artery bypass graft    . Cholecystectomy      Current Outpatient Rx  Name  Route  Sig  Dispense  Refill  .  acetaminophen (TYLENOL) 500 MG tablet   Oral   Take 1,000 mg by mouth every 6 (six) hours as needed for mild pain.         Marland Kitchen. albuterol (PROVENTIL HFA;VENTOLIN HFA) 108 (90 Base) MCG/ACT inhaler   Inhalation   Inhale 2 puffs into the lungs every 6 (six) hours as needed for wheezing or shortness of breath.   1 Inhaler   2   . apixaban (ELIQUIS) 5 MG TABS tablet   Oral   Take 1 tablet (5 mg total) by mouth 2 (two) times daily.   60 tablet   1   . atenolol (TENORMIN) 100 MG tablet   Oral   Take 100 mg by mouth daily.          Marland Kitchen. atorvastatin (LIPITOR) 40 MG tablet   Oral   Take 1 tablet (40 mg total) by mouth at bedtime.   30 tablet   1   . CALCIUM PO   Oral   Take 2 tablets by mouth 2 (two) times daily.         . Cholecalciferol (VITAMIN D PO)   Oral   Take 1 tablet by mouth every morning.         . citalopram (CELEXA) 20 MG tablet   Oral   Take 20 mg by mouth daily.          . clopidogrel (PLAVIX) 75 MG tablet   Oral   Take 75 mg by mouth daily.          Marland Kitchen. diltiazem (CARDIZEM CD) 180 MG 24 hr capsule   Oral  Take 180 mg by mouth daily.          . fluticasone (FLONASE) 50 MCG/ACT nasal spray   Each Nare   Place 2 sprays into both nostrils daily.         Marland Kitchen levofloxacin (LEVAQUIN) 750 MG tablet   Oral   Take 1 tablet (750 mg total) by mouth daily.   10 tablet   0   . lisinopril (PRINIVIL) 10 MG tablet   Oral   Take 1 tablet (10 mg total) by mouth daily.   30 tablet   1   . metFORMIN (GLUCOPHAGE-XR) 500 MG 24 hr tablet   Oral   Take 500 mg by mouth daily with breakfast.          . nitroGLYCERIN (NITROSTAT) 0.4 MG SL tablet   Sublingual   Place 0.4 mg under the tongue every 5 (five) minutes as needed for chest pain.         Marland Kitchen Olopatadine HCl (PATADAY) 0.2 % SOLN   Both Eyes   Place 2 drops into both eyes every morning.         Marland Kitchen omeprazole (PRILOSEC) 40 MG capsule   Oral   Take 1 capsule (40 mg total) by mouth every morning.    30 capsule   1   . traMADol (ULTRAM) 50 MG tablet   Oral   Take 50 mg by mouth every 6 (six) hours as needed for moderate pain.         Marland Kitchen VITAMIN E PO   Oral   Take 1 tablet by mouth every morning.           Allergies Morphine and Penicillins  No family history on file.  Social History Social History  Substance Use Topics  . Smoking status: Never Smoker   . Smokeless tobacco: Never Used  . Alcohol Use: No    Review of Systems Constitutional: No fever/chillsNoted the patient states that she felt like she was warm this morning. Eyes: No visual changes. ENT: No sore throat. Cardiovascular: Denies chest pain. Respiratory: Positive for shortness of breath and constant coughing but primarily with clear phlegm. Gastrointestinal: No abdominal pain.  No nausea, no vomiting.  No diarrhea.  No constipation. Genitourinary: Negative for dysuria. Musculoskeletal: Negative for back pain. Mild edema on the legs. Skin: Negative for rash. Neurological: Negative for headaches, focal weakness or numbness. Daughter states patient has just been progressively worsening and she's been somewhat weak just because of the constant coughing.  10-point ROS otherwise negative.  ____________________________________________   PHYSICAL EXAM:  VITAL SIGNS: ED Triage Vitals  Enc Vitals Group     BP 10/27/15 0653 122/52 mmHg     Pulse Rate 10/27/15 0653 78     Resp 10/27/15 0653 20     Temp 10/27/15 0653 97.8 F (36.6 C)     Temp Source 10/27/15 0653 Oral     SpO2 10/27/15 0653 97 %     Weight 10/27/15 0653 157 lb (71.215 kg)     Height 10/27/15 0653  (1.499 m)     Head Cir --      Peak Flow --      Pain Score 10/27/15 0654 5     Pain Loc --      Pain Edu? --      Excl. in GC? --     Constitutional: Alert and oriented. Well appearing and in Mild acute distress with frequent coughing. Eyes: Conjunctivae are normal. PERRL. EOMI.  Head: Atraumatic. Nose: No  congestion/rhinnorhea. Mouth/Throat: Mucous membranes are moist.  Oropharynx non-erythematous. Neck: No stridor.   Cardiovascular: Normal rate, regular rhythm. Grossly normal heart sounds.  Good peripheral circulation. Respiratory: Patient with mild accessory muscle use, rales at the bases, and diffuse mild wheezing throughout.   Gastrointestinal: Soft and nontender. No distention. No abdominal bruits. No CVA tenderness. Musculoskeletal: No lower extremity tenderness but mild chronic edema bilateral.  No joint effusions. Neurologic:  Normal speech and language. No gross focal neurologic deficits are appreciated. No gait instability. Skin:  Skin is warm, dry and intact. No rash noted. Psychiatric: Mood and affect are normal. Speech and behavior are normal.  ____________________________________________   LABS (all labs ordered are listed, but only abnormal results are displayed)  Labs Reviewed  URINALYSIS COMPLETEWITH MICROSCOPIC ([facility] ONLY) - Abnormal; Notable for the following:    Color, Urine YELLOW (*)    APPearance HAZY (*)    Leukocytes, UA 1+ (*)    Bacteria, UA RARE (*)    Squamous Epithelial / LPF 6-30 (*)    All other components within normal limits  CBC WITH DIFFERENTIAL/PLATELET - Abnormal; Notable for the following:    RBC 3.76 (*)    Hemoglobin 10.9 (*)    HCT 32.4 (*)    All other components within normal limits  COMPREHENSIVE METABOLIC PANEL - Abnormal; Notable for the following:    Sodium 134 (*)    Chloride 100 (*)    Glucose, Bld 130 (*)    BUN 25 (*)    Creatinine, Ser 1.10 (*)    GFR calc non Af Amer 43 (*)    GFR calc Af Amer 50 (*)    All other components within normal limits  BRAIN NATRIURETIC PEPTIDE - Abnormal; Notable for the following:    B Natriuretic Peptide 546.0 (*)    All other components within normal limits  BLOOD GAS, ARTERIAL - Abnormal; Notable for the following:    pO2, Arterial 81 (*)    Allens test (pass/fail) POSITIVE (*)    All  other components within normal limits  TROPONIN I   ____________________________________________  EKG  ED ECG REPORT I, Leona Carry, the attending physician, personally viewed and interpreted this ECG.   Date: 10/27/2015  EKG Time: 1039  Rate: 80  Rhythm: Paced rhythm  Axis: Normal  Intervals:left bundle branch block  ST&T Change: Nonspecific ST and T-wave changes  ____________________________________________  RADIOLOGY  Dg Chest 2 View  10/27/2015  CLINICAL DATA:  82 year old female with history of cough and fever for the past 5 days. Wheezing. EXAM: CHEST  2 VIEW COMPARISON:  Chest x-ray 12/04/2012. FINDINGS: Lung volumes are normal. No consolidative airspace disease. No pleural effusions. No evidence of pulmonary edema. Mild cardiomegaly. Extensive mitral annular calcifications. Upper mediastinal contours are within normal limits. Atherosclerosis in the thoracic aorta. Status post median sternotomy. Left-sided pacemaker device in position with lead tip projecting over the expected location of the right ventricular apex. Surgical clips overlying the right upper quadrant of the abdomen, compatible with prior cholecystectomy. IMPRESSION: 1. No radiographic evidence of acute cardiopulmonary disease. 2. Mild cardiomegaly. 3. Atherosclerosis. 4. Additional incidental findings, as above. Electronically Signed   By: Trudie Reed M.D.   On: 10/27/2015 09:08    ____________________________________________   PROCEDURES  Procedure(s) performed: None  Critical Care performed: No  ____________________________________________   INITIAL IMPRESSION / ASSESSMENT AND PLAN / ED COURSE  Pertinent labs & imaging results that were available during my  care of the patient were reviewed by me and considered in my medical decision making (see chart for details).  1:29 PM Patient will be given some breathing treatments while awaiting labs and x-rays.  1:29 PM Patient has been much  improved even since having just one breathing treatment. Patient's oxygen level was 81 on room air as well as her chest x-ray was negative and her BNP was only minimally elevated. Since patient also had a urinary tract infection we decided to place her on Keflex at home for bronchitis as well as urinary tract infections. Patient will also be placed on an inhaler to help with her breathing and she is to follow-up with her primary care M.D. as soon as possible. ____________________________________________   FINAL CLINICAL IMPRESSION(S) / ED DIAGNOSES  Final diagnoses:  Shortness of breath  Acute bronchitis, unspecified organism  Bronchospasm  Acute UTI      Leona Carry, MD 10/27/15 1329

## 2015-10-27 NOTE — ED Notes (Addendum)
Pt's daughter Dulcy FannyJeane Hopkins # [phone] or [phone]

## 2015-10-27 NOTE — ED Notes (Addendum)
Pt presents to ED with frequent wet sounding cough and fever since Tuesday. Pt was seen earlier in the week at urgent care and prescribed cough medication with little to no relief. Pt states this morning her cough became more frequent with wheezing present per her daughter. Pt currently has no increased work of breathing or distress noted. Pt ambulatory and alert. Able to answer questions without difficulty.

## 2015-10-29 LAB — BLOOD GAS, ARTERIAL
ACID-BASE EXCESS: 0.8 mmol/L (ref 0.0–3.0)
Allens test (pass/fail): POSITIVE — AB
BICARBONATE: 25.3 meq/L (ref 21.0–28.0)
FIO2: 0.21
O2 SAT: 96.1 %
PCO2 ART: 39 mmHg (ref 32.0–48.0)
PO2 ART: 81 mmHg — AB (ref 83.0–108.0)
Patient temperature: 37
pH, Arterial: 7.42 (ref 7.350–7.450)

## 2015-11-08 DIAGNOSIS — J209 Acute bronchitis, unspecified: Secondary | ICD-10-CM | POA: Diagnosis not present

## 2015-11-08 DIAGNOSIS — I509 Heart failure, unspecified: Secondary | ICD-10-CM | POA: Diagnosis not present

## 2015-11-15 DIAGNOSIS — E119 Type 2 diabetes mellitus without complications: Secondary | ICD-10-CM | POA: Diagnosis not present

## 2015-11-25 DIAGNOSIS — I482 Chronic atrial fibrillation: Secondary | ICD-10-CM | POA: Diagnosis not present

## 2015-11-25 DIAGNOSIS — I10 Essential (primary) hypertension: Secondary | ICD-10-CM | POA: Diagnosis not present

## 2015-11-25 DIAGNOSIS — H2511 Age-related nuclear cataract, right eye: Secondary | ICD-10-CM | POA: Diagnosis not present

## 2015-11-25 DIAGNOSIS — E119 Type 2 diabetes mellitus without complications: Secondary | ICD-10-CM | POA: Diagnosis not present

## 2015-11-25 DIAGNOSIS — I5022 Chronic systolic (congestive) heart failure: Secondary | ICD-10-CM | POA: Diagnosis not present

## 2015-11-25 DIAGNOSIS — Z79899 Other long term (current) drug therapy: Secondary | ICD-10-CM | POA: Diagnosis not present

## 2015-12-31 ENCOUNTER — Encounter: Payer: Self-pay | Admitting: Sports Medicine

## 2015-12-31 ENCOUNTER — Ambulatory Visit (INDEPENDENT_AMBULATORY_CARE_PROVIDER_SITE_OTHER): Payer: Commercial Managed Care - HMO | Admitting: Sports Medicine

## 2015-12-31 DIAGNOSIS — E0840 Diabetes mellitus due to underlying condition with diabetic neuropathy, unspecified: Secondary | ICD-10-CM

## 2015-12-31 DIAGNOSIS — M79676 Pain in unspecified toe(s): Secondary | ICD-10-CM

## 2015-12-31 DIAGNOSIS — B351 Tinea unguium: Secondary | ICD-10-CM | POA: Diagnosis not present

## 2015-12-31 NOTE — Patient Instructions (Signed)
Patient can try Fungi-nail OTC and Aspercreme for foot pain as needed

## 2015-12-31 NOTE — Progress Notes (Signed)
Patient ID: Veronica Short, female   DOB: 1923/12/28, 82 y.o.   MRN: [id number]  Subjective: Veronica Fennelrma R Dietze is a 82 y.o. female patient with history of type 2 diabetes who presents to office today complaining of long, painful nails while ambulating in shoes; unable to trim. Patient states that the glucose reading this morning was not recorded. Patient denies any new changes in medication or new problems. Patient denies any new cramping, numbness, burning or tingling in the legs.  Patient Active Problem List   Diagnosis Date Noted  . Speech disorder 01/31/2015  . Closed fracture of humerus, surgical neck 01/07/2015  . Rotator cuff arthropathy 01/07/2015  . TIA (transient ischemic attack) 01/03/2015  . CAD (coronary artery disease) 01/03/2015  . Diabetes mellitus without complication (HCC) 01/03/2015  . Hypertension 01/03/2015  . High cholesterol 01/03/2015  . Cerebral thrombosis with cerebral infarction (HCC) 01/03/2015  . Cerebral infarction due to thrombosis of cerebral artery (HCC) 01/03/2015  . Temporary cerebral vascular dysfunction 01/03/2015  . Atrial fibrillation, unspecified   . Speech disturbance   . Arthritis, degenerative 03/23/2014  . Aortic valve defect 11/20/2013  . CAD in native artery 11/20/2013  . Degeneration of intervertebral disc of lumbosacral region 11/20/2013  . Adiposity 11/20/2013  . OP (osteoporosis) 11/20/2013  . Diabetes mellitus (HCC) 11/20/2013  . Atrial fibrillation (HCC) 11/01/2013  . HLD (hyperlipidemia) 11/01/2013  . H/O coronary artery bypass surgery 11/01/2013  . Artificial cardiac pacemaker 02/22/2012  . History of cardiac catheterization 11/10/1993  . Myocardial infarction (HCC) 01/15/1991   Current Outpatient Prescriptions on File Prior to Visit  Medication Sig Dispense Refill  . acetaminophen (TYLENOL) 500 MG tablet Take 1,000 mg by mouth every 6 (six) hours as needed for mild pain.    Marland Kitchen. albuterol (PROVENTIL HFA;VENTOLIN HFA) 108 (90 Base)  MCG/ACT inhaler Inhale 2 puffs into the lungs every 6 (six) hours as needed for wheezing or shortness of breath. 1 Inhaler 2  . apixaban (ELIQUIS) 5 MG TABS tablet Take 1 tablet (5 mg total) by mouth 2 (two) times daily. 60 tablet 1  . atenolol (TENORMIN) 100 MG tablet Take 100 mg by mouth daily.     Marland Kitchen. atorvastatin (LIPITOR) 40 MG tablet Take 1 tablet (40 mg total) by mouth at bedtime. 30 tablet 1  . CALCIUM PO Take 2 tablets by mouth 2 (two) times daily.    . Cholecalciferol (VITAMIN D PO) Take 1 tablet by mouth every morning.    . citalopram (CELEXA) 20 MG tablet Take 20 mg by mouth daily.     . clopidogrel (PLAVIX) 75 MG tablet Take 75 mg by mouth daily.     Marland Kitchen. diltiazem (CARDIZEM CD) 180 MG 24 hr capsule Take 180 mg by mouth daily.     . fluticasone (FLONASE) 50 MCG/ACT nasal spray Place 2 sprays into both nostrils daily.    Marland Kitchen. lisinopril (PRINIVIL) 10 MG tablet Take 1 tablet (10 mg total) by mouth daily. 30 tablet 1  . metFORMIN (GLUCOPHAGE-XR) 500 MG 24 hr tablet Take 500 mg by mouth daily with breakfast.     . nitroGLYCERIN (NITROSTAT) 0.4 MG SL tablet Place 0.4 mg under the tongue every 5 (five) minutes as needed for chest pain.    Marland Kitchen. Olopatadine HCl (PATADAY) 0.2 % SOLN Place 2 drops into both eyes every morning.    Marland Kitchen. omeprazole (PRILOSEC) 40 MG capsule Take 1 capsule (40 mg total) by mouth every morning. 30 capsule 1  . traMADol (ULTRAM) 50 MG  tablet Take 50 mg by mouth every 6 (six) hours as needed for moderate pain.    Marland Kitchen VITAMIN E PO Take 1 tablet by mouth every morning.     No current facility-administered medications on file prior to visit.   Allergies  Allergen Reactions  . Morphine Anxiety    NUMBNESS  . Penicillins Hives and Rash    Objective: General: Patient is awake, alert, and oriented x 3 and in no acute distress.  Integument: Skin is warm, dry and supple bilateral. Nails are tender, long, thickened and  dystrophic with subungual debris, consistent with  onychomycosis, 1-5 bilateral. No signs of infection. No open lesions or preulcerative lesions present bilateral. Remaining integument unremarkable.  Vasculature:  Dorsalis Pedis pulse 2/4 bilateral. Posterior Tibial pulse  1/4 bilateral.  Capillary fill time <3 sec 1-5 bilateral. Positive hair growth to the level of the digits. Temperature gradient within normal limits. No varicosities present bilateral. No edema present bilateral.   Neurology: The patient has slightly diminished sensation measured with a 5.07/10g Semmes Weinstein Monofilament at all pedal sites bilateral. Vibratory sensation diminished bilateral with tuning fork. No Babinski sign present bilateral.   Musculoskeletal: Asymptomatic hammertoe pedal deformities and Pes planus end-stage noted bilateral. Muscular strength 5/5 in all lower extremity muscular groups bilateral without pain or limitation on range of motion . No tenderness with calf compression bilateral.  Assessment and Plan: Problem List Items Addressed This Visit      Endocrine   Diabetes mellitus (HCC)    Other Visit Diagnoses    Dermatophytosis of nail    -  Primary    Pain of toe, unspecified laterality          -Examined patient. -Discussed and educated patient on diabetic foot care, especially with  regards to the vascular, neurological and musculoskeletal systems.  -Stressed the importance of good glycemic control and the detriment of not  controlling glucose levels in relation to the foot. -Mechanically debrided all nails 1-5 bilateral using sterile nail nipper and filed with dremel without incident  -Recommend good supportive diabetic shoes daily -Recommend fungi nail OTC and icy hot with lidocaine or Aspercreme as needed for pain to feet -Answered all patient questions -Patient to return in 3 months for at risk foot care -Patient advised to call the office if any problems or questions arise in the meantime.  Asencion Islam, DPM

## 2016-01-23 ENCOUNTER — Encounter: Payer: Commercial Managed Care - HMO | Admitting: Podiatry

## 2016-02-24 NOTE — Progress Notes (Signed)
error 

## 2016-02-25 DIAGNOSIS — Z79899 Other long term (current) drug therapy: Secondary | ICD-10-CM | POA: Diagnosis not present

## 2016-02-25 DIAGNOSIS — E119 Type 2 diabetes mellitus without complications: Secondary | ICD-10-CM | POA: Diagnosis not present

## 2016-02-25 DIAGNOSIS — E7800 Pure hypercholesterolemia, unspecified: Secondary | ICD-10-CM | POA: Diagnosis not present

## 2016-03-03 DIAGNOSIS — I10 Essential (primary) hypertension: Secondary | ICD-10-CM | POA: Diagnosis not present

## 2016-03-03 DIAGNOSIS — Z79899 Other long term (current) drug therapy: Secondary | ICD-10-CM | POA: Diagnosis not present

## 2016-03-03 DIAGNOSIS — D638 Anemia in other chronic diseases classified elsewhere: Secondary | ICD-10-CM | POA: Diagnosis not present

## 2016-03-03 DIAGNOSIS — I2510 Atherosclerotic heart disease of native coronary artery without angina pectoris: Secondary | ICD-10-CM | POA: Diagnosis not present

## 2016-03-03 DIAGNOSIS — E1122 Type 2 diabetes mellitus with diabetic chronic kidney disease: Secondary | ICD-10-CM | POA: Diagnosis not present

## 2016-03-03 DIAGNOSIS — I482 Chronic atrial fibrillation: Secondary | ICD-10-CM | POA: Diagnosis not present

## 2016-03-03 DIAGNOSIS — E7800 Pure hypercholesterolemia, unspecified: Secondary | ICD-10-CM | POA: Diagnosis not present

## 2016-03-03 DIAGNOSIS — N183 Chronic kidney disease, stage 3 (moderate): Secondary | ICD-10-CM | POA: Diagnosis not present

## 2016-03-26 DIAGNOSIS — H04201 Unspecified epiphora, right lacrimal gland: Secondary | ICD-10-CM | POA: Diagnosis not present

## 2016-04-07 ENCOUNTER — Ambulatory Visit: Payer: Commercial Managed Care - HMO | Admitting: Sports Medicine

## 2016-04-07 ENCOUNTER — Ambulatory Visit (INDEPENDENT_AMBULATORY_CARE_PROVIDER_SITE_OTHER): Payer: Commercial Managed Care - HMO | Admitting: Podiatry

## 2016-04-07 ENCOUNTER — Encounter: Payer: Self-pay | Admitting: Podiatry

## 2016-04-07 DIAGNOSIS — B351 Tinea unguium: Secondary | ICD-10-CM | POA: Diagnosis not present

## 2016-04-07 DIAGNOSIS — M79676 Pain in unspecified toe(s): Secondary | ICD-10-CM

## 2016-04-07 DIAGNOSIS — L603 Nail dystrophy: Secondary | ICD-10-CM

## 2016-04-07 DIAGNOSIS — L608 Other nail disorders: Secondary | ICD-10-CM

## 2016-04-07 DIAGNOSIS — E0843 Diabetes mellitus due to underlying condition with diabetic autonomic (poly)neuropathy: Secondary | ICD-10-CM

## 2016-04-07 DIAGNOSIS — M79609 Pain in unspecified limb: Principal | ICD-10-CM

## 2016-04-07 DIAGNOSIS — L609 Nail disorder, unspecified: Secondary | ICD-10-CM

## 2016-04-07 NOTE — Progress Notes (Signed)
SUBJECTIVE Patient with a history of diabetes mellitus presents to office today complaining of elongated, thickened nails. Pain while ambulating in shoes. Patient is unable to trim their own nails.   Allergies  Allergen Reactions  . Morphine Anxiety    NUMBNESS  . Penicillins Hives and Rash    OBJECTIVE General Patient is awake, alert, and oriented x 3 and in no acute distress. Derm Skin is dry and supple bilateral. Negative open lesions or macerations. Remaining integument unremarkable. Nails are tender, long, thickened and dystrophic with subungual debris, consistent with onychomycosis, 1-5 bilateral. No signs of infection noted. Vasc  DP and PT pedal pulses palpable bilaterally. Temperature gradient within normal limits.  Neuro Epicritic and protective threshold sensation diminished bilaterally.  Musculoskeletal Exam No symptomatic pedal deformities noted bilateral. Muscular strength within normal limits.  ASSESSMENT 1. Diabetes Mellitus w/ peripheral neuropathy 2. Onychomycosis of nail due to dermatophyte bilateral 3. Pain in foot bilateral  PLAN OF CARE Patient evaluated today. Instructed to maintain good pedal hygiene and foot care. Stressed importance of controlling blood sugar.  Mechanical debridement of nails 1-5 bilaterally performed using a nail nipper. Filed with dremel without incident.  All patient questions were answered. Return to clinic in 3 mos.    Felecia ShellingBrent M Evans, DPM

## 2016-04-07 NOTE — Patient Instructions (Signed)
Diabetes and Foot Care Diabetes may cause you to have problems because of poor blood supply (circulation) to your feet and legs. This may cause the skin on your feet to become thinner, break easier, and heal more slowly. Your skin may become dry, and the skin may peel and crack. You may also have nerve damage in your legs and feet causing decreased feeling in them. You may not notice minor injuries to your feet that could lead to infections or more serious problems. Taking care of your feet is one of the most important things you can do for yourself.  HOME CARE INSTRUCTIONS  Wear shoes at all times, even in the house. Do not go barefoot. Bare feet are easily injured.  Check your feet daily for blisters, cuts, and redness. If you cannot see the bottom of your feet, use a mirror or ask someone for help.  Wash your feet with warm water (do not use hot water) and mild soap. Then pat your feet and the areas between your toes until they are completely dry. Do not soak your feet as this can dry your skin.  Apply a moisturizing lotion or petroleum jelly (that does not contain alcohol and is unscented) to the skin on your feet and to dry, brittle toenails. Do not apply lotion between your toes.  Trim your toenails straight across. Do not dig under them or around the cuticle. File the edges of your nails with an emery board or nail file.  Do not cut corns or calluses or try to remove them with medicine.  Wear clean socks or stockings every day. Make sure they are not too tight. Do not wear knee-high stockings since they may decrease blood flow to your legs.  Wear shoes that fit properly and have enough cushioning. To break in new shoes, wear them for just a few hours a day. This prevents you from injuring your feet. Always look in your shoes before you put them on to be sure there are no objects inside.  Do not cross your legs. This may decrease the blood flow to your feet.  If you find a minor scrape,  cut, or break in the skin on your feet, keep it and the skin around it clean and dry. These areas may be cleansed with mild soap and water. Do not cleanse the area with peroxide, alcohol, or iodine.  When you remove an adhesive bandage, be sure not to damage the skin around it.  If you have a wound, look at it several times a day to make sure it is healing.  Do not use heating pads or hot water bottles. They may burn your skin. If you have lost feeling in your feet or legs, you may not know it is happening until it is too late.  Make sure your health care provider performs a complete foot exam at least annually or more often if you have foot problems. Report any cuts, sores, or bruises to your health care provider immediately. SEEK MEDICAL CARE IF:   You have an injury that is not healing.  You have cuts or breaks in the skin.  You have an ingrown nail.  You notice redness on your legs or feet.  You feel burning or tingling in your legs or feet.  You have pain or cramps in your legs and feet.  Your legs or feet are numb.  Your feet always feel cold. SEEK IMMEDIATE MEDICAL CARE IF:   There is increasing redness,   swelling, or pain in or around a wound.  There is a red line that goes up your leg.  Pus is coming from a wound.  You develop a fever or as directed by your health care provider.  You notice a bad smell coming from an ulcer or wound.   This information is not intended to replace advice given to you by your health care provider. Make sure you discuss any questions you have with your health care provider.   Document Released: 06/26/2000 Document Revised: 03/01/2013 Document Reviewed: 12/06/2012 Elsevier Interactive Patient Education 2016 [employer]  

## 2016-04-09 DIAGNOSIS — I482 Chronic atrial fibrillation: Secondary | ICD-10-CM | POA: Diagnosis not present

## 2016-04-16 DIAGNOSIS — H16223 Keratoconjunctivitis sicca, not specified as Sjogren's, bilateral: Secondary | ICD-10-CM | POA: Diagnosis not present

## 2016-04-30 DIAGNOSIS — H16223 Keratoconjunctivitis sicca, not specified as Sjogren's, bilateral: Secondary | ICD-10-CM | POA: Diagnosis not present

## 2016-05-18 DIAGNOSIS — Z23 Encounter for immunization: Secondary | ICD-10-CM | POA: Diagnosis not present

## 2016-05-18 DIAGNOSIS — Z0000 Encounter for general adult medical examination without abnormal findings: Secondary | ICD-10-CM | POA: Diagnosis not present

## 2016-05-19 DIAGNOSIS — M25512 Pain in left shoulder: Secondary | ICD-10-CM | POA: Diagnosis not present

## 2016-05-19 DIAGNOSIS — M19112 Post-traumatic osteoarthritis, left shoulder: Secondary | ICD-10-CM | POA: Diagnosis not present

## 2016-05-30 DIAGNOSIS — K121 Other forms of stomatitis: Secondary | ICD-10-CM | POA: Diagnosis not present

## 2016-06-01 DIAGNOSIS — H16223 Keratoconjunctivitis sicca, not specified as Sjogren's, bilateral: Secondary | ICD-10-CM | POA: Diagnosis not present

## 2016-06-25 DIAGNOSIS — I6789 Other cerebrovascular disease: Secondary | ICD-10-CM | POA: Diagnosis not present

## 2016-06-25 DIAGNOSIS — R2689 Other abnormalities of gait and mobility: Secondary | ICD-10-CM | POA: Diagnosis not present

## 2016-07-02 DIAGNOSIS — D638 Anemia in other chronic diseases classified elsewhere: Secondary | ICD-10-CM | POA: Diagnosis not present

## 2016-07-02 DIAGNOSIS — E7800 Pure hypercholesterolemia, unspecified: Secondary | ICD-10-CM | POA: Diagnosis not present

## 2016-07-02 DIAGNOSIS — I482 Chronic atrial fibrillation: Secondary | ICD-10-CM | POA: Diagnosis not present

## 2016-07-02 DIAGNOSIS — Z79899 Other long term (current) drug therapy: Secondary | ICD-10-CM | POA: Diagnosis not present

## 2016-07-02 DIAGNOSIS — I10 Essential (primary) hypertension: Secondary | ICD-10-CM | POA: Diagnosis not present

## 2016-07-02 DIAGNOSIS — N183 Chronic kidney disease, stage 3 (moderate): Secondary | ICD-10-CM | POA: Diagnosis not present

## 2016-07-02 DIAGNOSIS — E119 Type 2 diabetes mellitus without complications: Secondary | ICD-10-CM | POA: Diagnosis not present

## 2016-07-02 DIAGNOSIS — E1122 Type 2 diabetes mellitus with diabetic chronic kidney disease: Secondary | ICD-10-CM | POA: Diagnosis not present

## 2016-07-07 ENCOUNTER — Ambulatory Visit: Payer: Commercial Managed Care - HMO | Admitting: Podiatry

## 2016-07-20 DIAGNOSIS — R05 Cough: Secondary | ICD-10-CM | POA: Diagnosis not present

## 2016-07-20 DIAGNOSIS — R0602 Shortness of breath: Secondary | ICD-10-CM | POA: Diagnosis not present

## 2016-07-21 ENCOUNTER — Ambulatory Visit: Payer: Commercial Managed Care - HMO | Admitting: Podiatry

## 2016-07-24 ENCOUNTER — Ambulatory Visit: Payer: Commercial Managed Care - HMO | Admitting: Podiatry

## 2016-07-31 ENCOUNTER — Ambulatory Visit: Payer: Commercial Managed Care - HMO | Admitting: Podiatry

## 2016-08-03 DIAGNOSIS — E119 Type 2 diabetes mellitus without complications: Secondary | ICD-10-CM | POA: Diagnosis not present

## 2016-09-01 ENCOUNTER — Ambulatory Visit (INDEPENDENT_AMBULATORY_CARE_PROVIDER_SITE_OTHER): Payer: Medicare HMO | Admitting: Podiatry

## 2016-09-01 DIAGNOSIS — M79609 Pain in unspecified limb: Secondary | ICD-10-CM

## 2016-09-01 DIAGNOSIS — L608 Other nail disorders: Secondary | ICD-10-CM

## 2016-09-01 DIAGNOSIS — B351 Tinea unguium: Secondary | ICD-10-CM | POA: Diagnosis not present

## 2016-09-01 DIAGNOSIS — L603 Nail dystrophy: Secondary | ICD-10-CM | POA: Diagnosis not present

## 2016-09-01 DIAGNOSIS — E0843 Diabetes mellitus due to underlying condition with diabetic autonomic (poly)neuropathy: Secondary | ICD-10-CM

## 2016-09-01 NOTE — Progress Notes (Signed)
   SUBJECTIVE Patient with a history of diabetes mellitus presents to office today complaining of elongated, thickened nails. Pain while ambulating in shoes. Patient is unable to trim their own nails.   OBJECTIVE General Patient is awake, alert, and oriented x 3 and in no acute distress. Derm Skin is dry and supple bilateral. Negative open lesions or macerations. Remaining integument unremarkable. Nails are tender, long, thickened and dystrophic with subungual debris, consistent with onychomycosis, 1-5 bilateral. No signs of infection noted. Vasc  DP and PT pedal pulses palpable bilaterally. Temperature gradient within normal limits.  Neuro Epicritic and protective threshold sensation diminished bilaterally.  Musculoskeletal Exam No symptomatic pedal deformities noted bilateral. Muscular strength within normal limits.  ASSESSMENT 1. Diabetes Mellitus w/ peripheral neuropathy 2. Onychomycosis of nail due to dermatophyte bilateral 3. Pain in foot bilateral  PLAN OF CARE 1. Patient evaluated today. 2. Instructed to maintain good pedal hygiene and foot care. Stressed importance of controlling blood sugar.  3. Mechanical debridement of nails 1-5 bilaterally performed using a nail nipper. Filed with dremel without incident.  4. Return to clinic in 3 mos.     Marijose Curington M. Zineb Glade, DPM [facility]  Dr. Cashis Rill M. Tysheena Ginzburg, DPM    [address]                                        [place], [place] [address]                Office [phone]  Fax [phone]       

## 2016-09-15 DIAGNOSIS — R05 Cough: Secondary | ICD-10-CM | POA: Diagnosis not present

## 2016-10-06 DIAGNOSIS — I482 Chronic atrial fibrillation: Secondary | ICD-10-CM | POA: Diagnosis not present

## 2016-10-07 DIAGNOSIS — X32XXXA Exposure to sunlight, initial encounter: Secondary | ICD-10-CM | POA: Diagnosis not present

## 2016-10-07 DIAGNOSIS — Z85828 Personal history of other malignant neoplasm of skin: Secondary | ICD-10-CM | POA: Diagnosis not present

## 2016-10-07 DIAGNOSIS — Z08 Encounter for follow-up examination after completed treatment for malignant neoplasm: Secondary | ICD-10-CM | POA: Diagnosis not present

## 2016-10-07 DIAGNOSIS — L821 Other seborrheic keratosis: Secondary | ICD-10-CM | POA: Diagnosis not present

## 2016-10-07 DIAGNOSIS — L570 Actinic keratosis: Secondary | ICD-10-CM | POA: Diagnosis not present

## 2016-10-12 ENCOUNTER — Other Ambulatory Visit: Payer: Self-pay | Admitting: Specialist

## 2016-10-12 DIAGNOSIS — R05 Cough: Secondary | ICD-10-CM

## 2016-10-12 DIAGNOSIS — R053 Chronic cough: Secondary | ICD-10-CM

## 2016-10-19 ENCOUNTER — Ambulatory Visit
Admission: RE | Admit: 2016-10-19 | Discharge: 2016-10-19 | Disposition: A | Discharge status: Home / Self Care | Payer: Medicare HMO | Source: Ambulatory Visit | Attending: Specialist | Admitting: Specialist

## 2016-10-19 DIAGNOSIS — R918 Other nonspecific abnormal finding of lung field: Secondary | ICD-10-CM | POA: Insufficient documentation

## 2016-10-19 DIAGNOSIS — R053 Chronic cough: Secondary | ICD-10-CM

## 2016-10-19 DIAGNOSIS — R05 Cough: Secondary | ICD-10-CM | POA: Diagnosis not present

## 2016-10-19 DIAGNOSIS — I700 Atherosclerosis of aorta: Secondary | ICD-10-CM | POA: Diagnosis not present

## 2016-10-19 DIAGNOSIS — R0602 Shortness of breath: Secondary | ICD-10-CM | POA: Diagnosis not present

## 2016-10-26 DIAGNOSIS — E7800 Pure hypercholesterolemia, unspecified: Secondary | ICD-10-CM | POA: Diagnosis not present

## 2016-10-26 DIAGNOSIS — Z79899 Other long term (current) drug therapy: Secondary | ICD-10-CM | POA: Diagnosis not present

## 2016-10-26 DIAGNOSIS — E119 Type 2 diabetes mellitus without complications: Secondary | ICD-10-CM | POA: Diagnosis not present

## 2016-11-02 DIAGNOSIS — E7800 Pure hypercholesterolemia, unspecified: Secondary | ICD-10-CM | POA: Diagnosis not present

## 2016-11-02 DIAGNOSIS — I10 Essential (primary) hypertension: Secondary | ICD-10-CM | POA: Diagnosis not present

## 2016-11-02 DIAGNOSIS — N183 Chronic kidney disease, stage 3 (moderate): Secondary | ICD-10-CM | POA: Diagnosis not present

## 2016-11-02 DIAGNOSIS — E1122 Type 2 diabetes mellitus with diabetic chronic kidney disease: Secondary | ICD-10-CM | POA: Diagnosis not present

## 2016-11-02 DIAGNOSIS — F3341 Major depressive disorder, recurrent, in partial remission: Secondary | ICD-10-CM | POA: Diagnosis not present

## 2016-11-02 DIAGNOSIS — Z79899 Other long term (current) drug therapy: Secondary | ICD-10-CM | POA: Diagnosis not present

## 2016-11-12 DIAGNOSIS — R05 Cough: Secondary | ICD-10-CM | POA: Diagnosis not present

## 2016-11-12 DIAGNOSIS — I2721 Secondary pulmonary arterial hypertension: Secondary | ICD-10-CM | POA: Diagnosis not present

## 2016-11-12 DIAGNOSIS — R0609 Other forms of dyspnea: Secondary | ICD-10-CM | POA: Diagnosis not present

## 2016-11-24 DIAGNOSIS — R0609 Other forms of dyspnea: Secondary | ICD-10-CM | POA: Diagnosis not present

## 2016-11-24 DIAGNOSIS — I2721 Secondary pulmonary arterial hypertension: Secondary | ICD-10-CM | POA: Diagnosis not present

## 2016-12-09 ENCOUNTER — Other Ambulatory Visit: Payer: Self-pay | Admitting: Specialist

## 2016-12-09 DIAGNOSIS — R05 Cough: Secondary | ICD-10-CM

## 2016-12-09 DIAGNOSIS — J310 Chronic rhinitis: Secondary | ICD-10-CM | POA: Diagnosis not present

## 2016-12-09 DIAGNOSIS — R0609 Other forms of dyspnea: Secondary | ICD-10-CM

## 2016-12-09 DIAGNOSIS — R059 Cough, unspecified: Secondary | ICD-10-CM

## 2016-12-09 DIAGNOSIS — R9389 Abnormal findings on diagnostic imaging of other specified body structures: Secondary | ICD-10-CM

## 2016-12-09 DIAGNOSIS — K219 Gastro-esophageal reflux disease without esophagitis: Secondary | ICD-10-CM | POA: Diagnosis not present

## 2016-12-09 DIAGNOSIS — R938 Abnormal findings on diagnostic imaging of other specified body structures: Secondary | ICD-10-CM | POA: Diagnosis not present

## 2016-12-10 ENCOUNTER — Encounter: Payer: Self-pay | Admitting: Podiatry

## 2016-12-10 ENCOUNTER — Ambulatory Visit (INDEPENDENT_AMBULATORY_CARE_PROVIDER_SITE_OTHER): Payer: Medicare HMO | Admitting: Podiatry

## 2016-12-10 DIAGNOSIS — B351 Tinea unguium: Secondary | ICD-10-CM | POA: Diagnosis not present

## 2016-12-10 DIAGNOSIS — M79609 Pain in unspecified limb: Secondary | ICD-10-CM

## 2016-12-10 DIAGNOSIS — E0843 Diabetes mellitus due to underlying condition with diabetic autonomic (poly)neuropathy: Secondary | ICD-10-CM

## 2016-12-10 NOTE — Progress Notes (Signed)
Complaint:  Visit Type: Patient returns to my office for continued preventative foot care services. Complaint: Patient states" my nails have grown long and thick and become painful to walk and wear shoes" Patient has been diagnosed with DM with neuropathy.. The patient presents for preventative foot care services. No changes to ROS  Podiatric Exam: Vascular: dorsalis pedis and posterior tibial pulses are palpable bilateral. Capillary return is immediate. Temperature gradient is WNL. Skin turgor WNL  Sensorium: Normal Semmes Weinstein monofilament test. Normal tactile sensation bilaterally. Nail Exam: Pt has thick disfigured discolored nails with subungual debris noted bilateral entire nail hallux through fifth toenails Ulcer Exam: There is no evidence of ulcer or pre-ulcerative changes or infection. Orthopedic Exam: Muscle tone and strength are WNL. No limitations in general ROM. No crepitus or effusions noted. Foot type and digits show no abnormalities. Bony prominences are unremarkable. Skin: No Porokeratosis. No infection or ulcers  Diagnosis:  Onychomycosis, , Pain in right toe, pain in left toes  Treatment & Plan Procedures and Treatment: Consent by patient was obtained for treatment procedures. The patient understood the discussion of treatment and procedures well. All questions were answered thoroughly reviewed. Debridement of mycotic and hypertrophic toenails, 1 through 5 bilateral and clearing of subungual debris. No ulceration, no infection noted.  Return Visit-Office Procedure: Patient instructed to return to the office for a follow up visit 3 months for continued evaluation and treatment.    Terrel Nesheiwat DPM 

## 2016-12-14 ENCOUNTER — Other Ambulatory Visit: Payer: Self-pay | Admitting: Specialist

## 2016-12-14 DIAGNOSIS — R1310 Dysphagia, unspecified: Secondary | ICD-10-CM

## 2017-01-04 ENCOUNTER — Ambulatory Visit: Admission: RE | Admit: 2017-01-04 | Payer: PRIVATE HEALTH INSURANCE | Source: Ambulatory Visit

## 2017-02-04 ENCOUNTER — Ambulatory Visit
Admission: RE | Admit: 2017-02-04 | Discharge: 2017-02-04 | Disposition: A | Discharge status: Home / Self Care | Payer: Medicare HMO | Source: Ambulatory Visit | Attending: Specialist | Admitting: Specialist

## 2017-02-04 DIAGNOSIS — F419 Anxiety disorder, unspecified: Secondary | ICD-10-CM | POA: Diagnosis not present

## 2017-02-04 DIAGNOSIS — M810 Age-related osteoporosis without current pathological fracture: Secondary | ICD-10-CM | POA: Diagnosis not present

## 2017-02-04 DIAGNOSIS — R5382 Chronic fatigue, unspecified: Secondary | ICD-10-CM | POA: Diagnosis not present

## 2017-02-04 DIAGNOSIS — I482 Chronic atrial fibrillation: Secondary | ICD-10-CM | POA: Diagnosis not present

## 2017-02-11 DIAGNOSIS — R938 Abnormal findings on diagnostic imaging of other specified body structures: Secondary | ICD-10-CM | POA: Diagnosis not present

## 2017-02-11 DIAGNOSIS — K219 Gastro-esophageal reflux disease without esophagitis: Secondary | ICD-10-CM | POA: Diagnosis not present

## 2017-02-11 DIAGNOSIS — R05 Cough: Secondary | ICD-10-CM | POA: Diagnosis not present

## 2017-02-12 ENCOUNTER — Other Ambulatory Visit: Payer: Self-pay | Admitting: Specialist

## 2017-02-12 DIAGNOSIS — R9389 Abnormal findings on diagnostic imaging of other specified body structures: Secondary | ICD-10-CM

## 2017-02-24 ENCOUNTER — Ambulatory Visit: Payer: Medicare HMO

## 2017-02-25 ENCOUNTER — Ambulatory Visit
Admission: RE | Admit: 2017-02-25 | Discharge: 2017-02-25 | Disposition: A | Discharge status: Home / Self Care | Payer: Medicare HMO | Source: Ambulatory Visit | Attending: Specialist | Admitting: Specialist

## 2017-02-25 DIAGNOSIS — I700 Atherosclerosis of aorta: Secondary | ICD-10-CM | POA: Insufficient documentation

## 2017-02-25 DIAGNOSIS — Z951 Presence of aortocoronary bypass graft: Secondary | ICD-10-CM | POA: Insufficient documentation

## 2017-02-25 DIAGNOSIS — R938 Abnormal findings on diagnostic imaging of other specified body structures: Secondary | ICD-10-CM | POA: Diagnosis present

## 2017-02-25 DIAGNOSIS — I2510 Atherosclerotic heart disease of native coronary artery without angina pectoris: Secondary | ICD-10-CM | POA: Insufficient documentation

## 2017-02-25 DIAGNOSIS — I517 Cardiomegaly: Secondary | ICD-10-CM | POA: Insufficient documentation

## 2017-02-25 DIAGNOSIS — R9389 Abnormal findings on diagnostic imaging of other specified body structures: Secondary | ICD-10-CM

## 2017-02-25 DIAGNOSIS — R918 Other nonspecific abnormal finding of lung field: Secondary | ICD-10-CM | POA: Diagnosis not present

## 2017-03-03 ENCOUNTER — Ambulatory Visit: Payer: PRIVATE HEALTH INSURANCE | Attending: Specialist

## 2017-03-08 ENCOUNTER — Ambulatory Visit (INDEPENDENT_AMBULATORY_CARE_PROVIDER_SITE_OTHER): Payer: Medicare HMO | Admitting: Podiatry

## 2017-03-08 ENCOUNTER — Ambulatory Visit: Payer: Medicare HMO | Admitting: Podiatry

## 2017-03-08 DIAGNOSIS — B351 Tinea unguium: Secondary | ICD-10-CM

## 2017-03-08 DIAGNOSIS — E0843 Diabetes mellitus due to underlying condition with diabetic autonomic (poly)neuropathy: Secondary | ICD-10-CM

## 2017-03-08 DIAGNOSIS — M79609 Pain in unspecified limb: Secondary | ICD-10-CM

## 2017-03-08 NOTE — Progress Notes (Signed)
Complaint:  Visit Type: Patient returns to my office for continued preventative foot care services. Complaint: Patient states" my nails have grown long and thick and become painful to walk and wear shoes" Patient has been diagnosed with DM with neuropathy.. The patient presents for preventative foot care services. No changes to ROS  Podiatric Exam: Vascular: dorsalis pedis and posterior tibial pulses are palpable bilateral. Capillary return is immediate. Temperature gradient is WNL. Skin turgor WNL  Sensorium: Normal Semmes Weinstein monofilament test. Normal tactile sensation bilaterally. Nail Exam: Pt has thick disfigured discolored nails with subungual debris noted bilateral entire nail hallux through fifth toenails Ulcer Exam: There is no evidence of ulcer or pre-ulcerative changes or infection. Orthopedic Exam: Muscle tone and strength are WNL. No limitations in general ROM. No crepitus or effusions noted. Foot type and digits show no abnormalities. Bony prominences are unremarkable. Skin: No Porokeratosis. No infection or ulcers  Diagnosis:  Onychomycosis, , Pain in right toe, pain in left toes  Treatment & Plan Procedures and Treatment: Consent by patient was obtained for treatment procedures. The patient understood the discussion of treatment and procedures well. All questions were answered thoroughly reviewed. Debridement of mycotic and hypertrophic toenails, 1 through 5 bilateral and clearing of subungual debris. No ulceration, no infection noted.  Return Visit-Office Procedure: Patient instructed to return to the office for a follow up visit 3 months for continued evaluation and treatment.    Noha Karasik DPM 

## 2017-03-11 DIAGNOSIS — K219 Gastro-esophageal reflux disease without esophagitis: Secondary | ICD-10-CM | POA: Diagnosis not present

## 2017-03-11 DIAGNOSIS — R05 Cough: Secondary | ICD-10-CM | POA: Diagnosis not present

## 2017-03-11 DIAGNOSIS — J310 Chronic rhinitis: Secondary | ICD-10-CM | POA: Diagnosis not present

## 2017-03-18 ENCOUNTER — Other Ambulatory Visit: Payer: Self-pay | Admitting: Gastroenterology

## 2017-03-18 DIAGNOSIS — R194 Change in bowel habit: Secondary | ICD-10-CM | POA: Diagnosis not present

## 2017-03-18 DIAGNOSIS — R1310 Dysphagia, unspecified: Secondary | ICD-10-CM | POA: Diagnosis not present

## 2017-03-18 DIAGNOSIS — K219 Gastro-esophageal reflux disease without esophagitis: Secondary | ICD-10-CM | POA: Diagnosis not present

## 2017-03-18 DIAGNOSIS — R1319 Other dysphagia: Secondary | ICD-10-CM

## 2017-03-24 ENCOUNTER — Ambulatory Visit: Payer: PRIVATE HEALTH INSURANCE | Attending: Gastroenterology

## 2017-03-26 ENCOUNTER — Emergency Department: Payer: Medicare HMO

## 2017-03-26 ENCOUNTER — Observation Stay
Admission: EM | Admit: 2017-03-26 | Discharge: 2017-03-27 | Disposition: A | Discharge status: Home / Self Care | Payer: Medicare HMO | Attending: Internal Medicine | Admitting: Internal Medicine

## 2017-03-26 ENCOUNTER — Observation Stay: Payer: Medicare HMO

## 2017-03-26 DIAGNOSIS — E119 Type 2 diabetes mellitus without complications: Secondary | ICD-10-CM | POA: Insufficient documentation

## 2017-03-26 DIAGNOSIS — R202 Paresthesia of skin: Secondary | ICD-10-CM

## 2017-03-26 DIAGNOSIS — E785 Hyperlipidemia, unspecified: Secondary | ICD-10-CM | POA: Insufficient documentation

## 2017-03-26 DIAGNOSIS — G459 Transient cerebral ischemic attack, unspecified: Secondary | ICD-10-CM | POA: Diagnosis present

## 2017-03-26 DIAGNOSIS — M6281 Muscle weakness (generalized): Secondary | ICD-10-CM | POA: Diagnosis not present

## 2017-03-26 DIAGNOSIS — Z950 Presence of cardiac pacemaker: Secondary | ICD-10-CM | POA: Diagnosis not present

## 2017-03-26 DIAGNOSIS — I639 Cerebral infarction, unspecified: Principal | ICD-10-CM | POA: Insufficient documentation

## 2017-03-26 DIAGNOSIS — Z7951 Long term (current) use of inhaled steroids: Secondary | ICD-10-CM | POA: Insufficient documentation

## 2017-03-26 DIAGNOSIS — Z79899 Other long term (current) drug therapy: Secondary | ICD-10-CM | POA: Diagnosis not present

## 2017-03-26 DIAGNOSIS — I6523 Occlusion and stenosis of bilateral carotid arteries: Secondary | ICD-10-CM | POA: Diagnosis not present

## 2017-03-26 DIAGNOSIS — Z951 Presence of aortocoronary bypass graft: Secondary | ICD-10-CM | POA: Diagnosis not present

## 2017-03-26 DIAGNOSIS — I482 Chronic atrial fibrillation, unspecified: Secondary | ICD-10-CM

## 2017-03-26 DIAGNOSIS — I2510 Atherosclerotic heart disease of native coronary artery without angina pectoris: Secondary | ICD-10-CM | POA: Diagnosis not present

## 2017-03-26 DIAGNOSIS — I252 Old myocardial infarction: Secondary | ICD-10-CM | POA: Diagnosis not present

## 2017-03-26 DIAGNOSIS — E7800 Pure hypercholesterolemia, unspecified: Secondary | ICD-10-CM | POA: Diagnosis not present

## 2017-03-26 DIAGNOSIS — I110 Hypertensive heart disease with heart failure: Secondary | ICD-10-CM | POA: Diagnosis not present

## 2017-03-26 DIAGNOSIS — I517 Cardiomegaly: Secondary | ICD-10-CM | POA: Diagnosis not present

## 2017-03-26 DIAGNOSIS — Z23 Encounter for immunization: Secondary | ICD-10-CM | POA: Insufficient documentation

## 2017-03-26 DIAGNOSIS — R200 Anesthesia of skin: Secondary | ICD-10-CM | POA: Diagnosis not present

## 2017-03-26 DIAGNOSIS — I509 Heart failure, unspecified: Secondary | ICD-10-CM | POA: Diagnosis not present

## 2017-03-26 DIAGNOSIS — Z7984 Long term (current) use of oral hypoglycemic drugs: Secondary | ICD-10-CM | POA: Insufficient documentation

## 2017-03-26 DIAGNOSIS — R29818 Other symptoms and signs involving the nervous system: Secondary | ICD-10-CM | POA: Diagnosis not present

## 2017-03-26 DIAGNOSIS — Z7901 Long term (current) use of anticoagulants: Secondary | ICD-10-CM | POA: Insufficient documentation

## 2017-03-26 LAB — URINALYSIS, COMPLETE (UACMP) WITH MICROSCOPIC
BACTERIA UA: NONE SEEN
Bilirubin Urine: NEGATIVE
GLUCOSE, UA: NEGATIVE mg/dL
Hgb urine dipstick: NEGATIVE
KETONES UR: NEGATIVE mg/dL
LEUKOCYTES UA: NEGATIVE
Nitrite: NEGATIVE
PROTEIN: NEGATIVE mg/dL
Specific Gravity, Urine: 1.006 (ref 1.005–1.030)
pH: 7 (ref 5.0–8.0)

## 2017-03-26 LAB — CBC
HEMATOCRIT: 33.6 % — AB (ref 35.0–47.0)
HEMOGLOBIN: 11.3 g/dL — AB (ref 12.0–16.0)
MCH: 28.9 pg (ref 26.0–34.0)
MCHC: 33.7 g/dL (ref 32.0–36.0)
MCV: 85.6 fL (ref 80.0–100.0)
Platelets: 271 10*3/uL (ref 150–440)
RBC: 3.92 MIL/uL (ref 3.80–5.20)
RDW: 13.9 % (ref 11.5–14.5)
WBC: 5.5 10*3/uL (ref 3.6–11.0)

## 2017-03-26 LAB — COMPREHENSIVE METABOLIC PANEL
ALBUMIN: 3.4 g/dL — AB (ref 3.5–5.0)
ALT: 11 U/L — AB (ref 14–54)
AST: 26 U/L (ref 15–41)
Alkaline Phosphatase: 85 U/L (ref 38–126)
Anion gap: 11 (ref 5–15)
BUN: 17 mg/dL (ref 6–20)
CHLORIDE: 99 mmol/L — AB (ref 101–111)
CO2: 25 mmol/L (ref 22–32)
CREATININE: 1.08 mg/dL — AB (ref 0.44–1.00)
Calcium: 9.1 mg/dL (ref 8.9–10.3)
GFR calc Af Amer: 51 mL/min — ABNORMAL LOW (ref >60)
GFR, EST NON AFRICAN AMERICAN: 44 mL/min — AB (ref >60)
GLUCOSE: 192 mg/dL — AB (ref 65–99)
Potassium: 4.3 mmol/L (ref 3.5–5.1)
Sodium: 135 mmol/L (ref 135–145)
TOTAL PROTEIN: 6.8 g/dL (ref 6.5–8.1)
Total Bilirubin: 0.5 mg/dL (ref 0.3–1.2)

## 2017-03-26 LAB — DIFFERENTIAL
BASOS ABS: 0 10*3/uL (ref 0–0.1)
Basophils Relative: 1 %
Eosinophils Absolute: 0.1 10*3/uL (ref 0–0.7)
Eosinophils Relative: 2 %
LYMPHS ABS: 1.3 10*3/uL (ref 1.0–3.6)
Lymphocytes Relative: 23 %
MONOS PCT: 9 %
Monocytes Absolute: 0.5 10*3/uL (ref 0.2–0.9)
NEUTROS ABS: 3.6 10*3/uL (ref 1.4–6.5)
NEUTROS PCT: 65 %

## 2017-03-26 LAB — APTT: aPTT: 43 seconds — ABNORMAL HIGH (ref 24–36)

## 2017-03-26 LAB — TROPONIN I: Troponin I: <0.03 ng/mL (ref <0.03)

## 2017-03-26 LAB — PROTIME-INR
INR: 1.8
Prothrombin Time: 20.7 seconds — ABNORMAL HIGH (ref 11.4–15.2)

## 2017-03-26 LAB — GLUCOSE, CAPILLARY: GLUCOSE-CAPILLARY: 179 mg/dL — AB (ref 65–99)

## 2017-03-26 MED ORDER — SENNOSIDES-DOCUSATE SODIUM 8.6-50 MG PO TABS: 1.0000 | ORAL_TABLET | Freq: Every evening | ORAL | Status: DC | PRN

## 2017-03-26 MED ORDER — NITROGLYCERIN 0.4 MG SL SUBL: 0.4000 mg | SUBLINGUAL_TABLET | SUBLINGUAL | Status: DC | PRN

## 2017-03-26 MED ORDER — ACETAMINOPHEN 325 MG PO TABS: 650.0000 mg | ORAL_TABLET | ORAL | Status: DC | PRN

## 2017-03-26 MED ORDER — ATORVASTATIN CALCIUM 20 MG PO TABS
40.0000 mg | ORAL_TABLET | Freq: Every day | ORAL | Status: DC
  Administered 2017-03-26: 21:00:00 40 mg via ORAL
  Filled 2017-03-26: qty 2

## 2017-03-26 MED ORDER — DILTIAZEM HCL ER COATED BEADS 180 MG PO CP24
180.0000 mg | ORAL_CAPSULE | Freq: Every day | ORAL | Status: DC
  Administered 2017-03-26 – 2017-03-27 (×2): 180 mg via ORAL
  Filled 2017-03-26 (×2): qty 1

## 2017-03-26 MED ORDER — STROKE: EARLY STAGES OF RECOVERY BOOK
Freq: Once | Status: AC
  Administered 2017-03-26: 15:00:00

## 2017-03-26 MED ORDER — FLUTICASONE PROPIONATE 50 MCG/ACT NA SUSP
2.0000 | Freq: Every day | NASAL | Status: DC
  Administered 2017-03-26 – 2017-03-27 (×2): 2 via NASAL
  Filled 2017-03-26: qty 16

## 2017-03-26 MED ORDER — ACETAMINOPHEN 160 MG/5ML PO SOLN
650.0000 mg | ORAL | Status: DC | PRN
  Administered 2017-03-26: 17:00:00 650 mg
  Filled 2017-03-26 (×2): qty 20.3

## 2017-03-26 MED ORDER — TRAMADOL HCL 50 MG PO TABS: 50.0000 mg | ORAL_TABLET | Freq: Four times a day (QID) | ORAL | Status: DC | PRN

## 2017-03-26 MED ORDER — ACETAMINOPHEN 650 MG RE SUPP: 650.0000 mg | RECTAL | Status: DC | PRN

## 2017-03-26 MED ORDER — PNEUMOCOCCAL VAC POLYVALENT 25 MCG/0.5ML IJ INJ
0.5000 mL | INJECTION | INTRAMUSCULAR | Status: DC
  Filled 2017-03-26: qty 0.5

## 2017-03-26 MED ORDER — FUROSEMIDE 20 MG PO TABS
20.0000 mg | ORAL_TABLET | Freq: Every day | ORAL | Status: DC
  Administered 2017-03-26 – 2017-03-27 (×2): 20 mg via ORAL
  Filled 2017-03-26 (×2): qty 1

## 2017-03-26 MED ORDER — DULOXETINE HCL 30 MG PO CPEP
30.0000 mg | ORAL_CAPSULE | Freq: Every day | ORAL | Status: DC
  Administered 2017-03-26 – 2017-03-27 (×2): 30 mg via ORAL
  Filled 2017-03-26 (×2): qty 1

## 2017-03-26 MED ORDER — CITALOPRAM HYDROBROMIDE 20 MG PO TABS
20.0000 mg | ORAL_TABLET | Freq: Every day | ORAL | Status: DC
  Administered 2017-03-26 – 2017-03-27 (×2): 20 mg via ORAL
  Filled 2017-03-26 (×2): qty 1

## 2017-03-26 MED ORDER — ATENOLOL 100 MG PO TABS
100.0000 mg | ORAL_TABLET | Freq: Every day | ORAL | Status: DC
  Administered 2017-03-26 – 2017-03-27 (×2): 100 mg via ORAL
  Filled 2017-03-26 (×2): qty 1

## 2017-03-26 MED ORDER — LOSARTAN POTASSIUM 25 MG PO TABS
25.0000 mg | ORAL_TABLET | Freq: Every day | ORAL | Status: DC
  Administered 2017-03-26 – 2017-03-27 (×2): 25 mg via ORAL
  Filled 2017-03-26 (×2): qty 1

## 2017-03-26 MED ORDER — ASPIRIN EC 81 MG PO TBEC
81.0000 mg | DELAYED_RELEASE_TABLET | Freq: Every day | ORAL | Status: DC
  Administered 2017-03-26 – 2017-03-27 (×2): 81 mg via ORAL
  Filled 2017-03-26 (×2): qty 1

## 2017-03-26 MED ORDER — ACETAMINOPHEN 500 MG PO TABS: 1000.0000 mg | ORAL_TABLET | Freq: Four times a day (QID) | ORAL | Status: DC | PRN

## 2017-03-26 MED ORDER — CHOLECALCIFEROL 10 MCG (400 UNIT) PO TABS
400.0000 [IU] | ORAL_TABLET | Freq: Every day | ORAL | Status: DC
  Administered 2017-03-26 – 2017-03-27 (×2): 400 [IU] via ORAL
  Filled 2017-03-26 (×2): qty 1

## 2017-03-26 MED ORDER — APIXABAN 5 MG PO TABS
5.0000 mg | ORAL_TABLET | Freq: Two times a day (BID) | ORAL | Status: DC
  Administered 2017-03-26 – 2017-03-27 (×2): 5 mg via ORAL
  Filled 2017-03-26 (×2): qty 1

## 2017-03-26 MED ORDER — METFORMIN HCL ER 500 MG PO TB24
500.0000 mg | ORAL_TABLET | Freq: Every day | ORAL | Status: DC
  Administered 2017-03-26 – 2017-03-27 (×2): 500 mg via ORAL
  Filled 2017-03-26 (×2): qty 1

## 2017-03-26 MED ORDER — CALCIUM CARBONATE ANTACID 500 MG PO CHEW
1.0000 | CHEWABLE_TABLET | Freq: Two times a day (BID) | ORAL | Status: DC
  Administered 2017-03-26 – 2017-03-27 (×2): 200 mg via ORAL
  Filled 2017-03-26 (×2): qty 1

## 2017-03-26 MED ORDER — ALBUTEROL SULFATE (2.5 MG/3ML) 0.083% IN NEBU: 2.5000 mg | INHALATION_SOLUTION | Freq: Four times a day (QID) | RESPIRATORY_TRACT | Status: DC | PRN

## 2017-03-26 MED ORDER — OLOPATADINE HCL 0.1 % OP SOLN
1.0000 [drp] | Freq: Two times a day (BID) | OPHTHALMIC | Status: DC
  Administered 2017-03-26 – 2017-03-27 (×2): 1 [drp] via OPHTHALMIC
  Filled 2017-03-26: qty 5

## 2017-03-26 MED ORDER — INFLUENZA VAC SPLIT HIGH-DOSE 0.5 ML IM SUSY
0.5000 mL | PREFILLED_SYRINGE | INTRAMUSCULAR | Status: AC
  Administered 2017-03-27: 13:00:00 0.5 mL via INTRAMUSCULAR
  Filled 2017-03-26 (×2): qty 0.5

## 2017-03-26 NOTE — ED Triage Notes (Addendum)
Per EMS pt comes from home.  Pt was experiencing tingling in her lips and in her fingers at breakfast.  She states that this happened to her before her previous stroke.  Pt is A&Ox4.  Pt denies new pain.  Existing facial droop is at baseline for her.

## 2017-03-26 NOTE — ED Notes (Signed)
Pt transferred to commode

## 2017-03-26 NOTE — ED Provider Notes (Signed)
[facility] Emergency Department Provider Note    None    (approximate)  I have reviewed the triage vital signs and the nursing notes.   HISTORY  Chief Complaint Tingling    HPI Veronica Short is a 83 y.o. female with a history of TIA and stroke presents with tingling in her left face and in her left arm that started around breakfast this morning. States that the symptoms were most severe lasting over 45 minutes and have steadily improved. Denies any headache. No chest pain or shortness of breath. No fevers. No changes to her medications. Denies any nausea or vomiting. No abdominal pain. No lower extremity swelling.   Past Medical History:  Diagnosis Date  . Arthritis   . CAD (coronary artery disease)   . CHF (congestive heart failure) (HCC)   . Diabetes mellitus without complication (HCC)   . High cholesterol   . Hypertension    History reviewed. No pertinent family history. Past Surgical History:  Procedure Laterality Date  . CHOLECYSTECTOMY    . CORONARY ARTERY BYPASS GRAFT    . PACEMAKER INSERTION     Patient Active Problem List   Diagnosis Date Noted  . Speech disorder 01/31/2015  . Closed 2-part nondisplaced fracture of surgical neck of left humerus 01/07/2015  . Rotator cuff arthropathy 01/07/2015  . Left rotator cuff tear arthropathy 01/07/2015  . TIA (transient ischemic attack) 01/03/2015  . CAD (coronary artery disease) 01/03/2015  . Diabetes mellitus without complication (HCC) 01/03/2015  . Hypertension 01/03/2015  . High cholesterol 01/03/2015  . Cerebral thrombosis with cerebral infarction (HCC) 01/03/2015  . Cerebral infarction due to thrombosis of cerebral artery (HCC) 01/03/2015  . Transient cerebral ischemia 01/03/2015  . Atrial fibrillation, unspecified   . Speech disturbance   . Osteoarthritis 03/23/2014  . Aortic valve disorder 11/20/2013  . CAD (coronary artery disease), native coronary artery 11/20/2013  . DDD  (degenerative disc disease), lumbosacral 11/20/2013  . Obesity, unspecified 11/20/2013  . Osteoporosis 11/20/2013  . Type II or unspecified type diabetes mellitus without mention of complication, not stated as uncontrolled 11/20/2013  . Atrial fibrillation (HCC) 11/01/2013  . Other and unspecified hyperlipidemia 11/01/2013  . S/P CABG x 4 11/01/2013  . Pacemaker 02/22/2012  . H/O cardiac catheterization 11/10/1993  . MI (myocardial infarction) (HCC) 01/15/1991      Prior to Admission medications   Medication Sig Start Date End Date Taking? Authorizing Provider  apixaban (ELIQUIS) 5 MG TABS tablet Take 1 tablet (5 mg total) by mouth 2 (two) times daily. 01/04/15  Yes Tat, Onalee Hua, MD  atenolol (TENORMIN) 100 MG tablet Take 100 mg by mouth daily.  10/14/13  Yes [provider]  atorvastatin (LIPITOR) 40 MG tablet Take 1 tablet (40 mg total) by mouth at bedtime. 01/04/15  Yes Tat, Onalee Hua, MD  CALCIUM PO Take 2 tablets by mouth 2 (two) times daily.   Yes [provider]  Cholecalciferol (VITAMIN D PO) Take 1 tablet by mouth every morning.   Yes [provider]  DULoxetine (CYMBALTA) 30 MG capsule Take 30 mg by mouth daily.   Yes [provider]  fluticasone (FLONASE) 50 MCG/ACT nasal spray Place 2 sprays into both nostrils daily.   Yes [provider]  metFORMIN (GLUCOPHAGE-XR) 500 MG 24 hr tablet Take 500 mg by mouth daily with breakfast.  11/01/13  Yes [provider]  acetaminophen (TYLENOL) 500 MG tablet Take 1,000 mg by mouth every 6 (six) hours as needed  for mild pain.    [provider]  albuterol (PROVENTIL HFA;VENTOLIN HFA) 108 (90 Base) MCG/ACT inhaler Inhale 2 puffs into the lungs every 6 (six) hours as needed for wheezing or shortness of breath. 10/27/15   Leona Carry, MD  citalopram (CELEXA) 20 MG tablet Take 20 mg by mouth daily.  10/18/13   [provider]  diltiazem (CARDIZEM CD) 180 MG 24 hr capsule Take 180 mg  by mouth daily.  11/01/13   [provider]  furosemide (LASIX) 20 MG tablet TAKE 1 TABLET EVERY DAY 08/19/16   [provider]  losartan (COZAAR) 25 MG tablet Take by mouth. 07/23/16 07/23/17  [provider]  nitroGLYCERIN (NITROSTAT) 0.4 MG SL tablet Place 0.4 mg under the tongue every 5 (five) minutes as needed for chest pain.    [provider]  Olopatadine HCl (PATADAY) 0.2 % SOLN Place 2 drops into both eyes every morning.    [provider]  traMADol (ULTRAM) 50 MG tablet Take 50 mg by mouth every 6 (six) hours as needed for moderate pain.    [provider]    Allergies Morphine and Penicillins    Social History Social History  Substance Use Topics  . Smoking status: Never Smoker  . Smokeless tobacco: Never Used  . Alcohol use No    Review of Systems Patient denies headaches, rhinorrhea, blurry vision, numbness, shortness of breath, chest pain, edema, cough, abdominal pain, nausea, vomiting, diarrhea, dysuria, fevers, rashes or hallucinations unless otherwise stated above in HPI. ____________________________________________   PHYSICAL EXAM:  VITAL SIGNS: Vitals:   03/26/17 1049  BP: 126/72  Pulse: 82  Resp: 19  Temp: 98.1 F (36.7 C)  SpO2: 94%    Constitutional: Alert and oriented. Well appearing and in no acute distress. Eyes: Conjunctivae are normal.  Head: Atraumatic. Nose: No congestion/rhinnorhea. Mouth/Throat: Mucous membranes are moist.   Neck: No stridor. Painless ROM.  Cardiovascular: Normal rate, regular rhythm. Grossly normal heart sounds.  Good peripheral circulation. Respiratory: Normal respiratory effort.  No retractions. Lungs CTAB. Gastrointestinal: Soft and nontender. No distention. No abdominal bruits. No CVA tenderness. Genitourinary:  Musculoskeletal: No lower extremity tenderness nor edema.  No joint effusions. Neurologic:  Normal speech and language. Reports paresthesias to left lower  face and tongue and LUE.   No facial droop appreciated at this time. EOMI intact.  CN2-12 intact.  MAE Skin:  Skin is warm, dry and intact. No rash noted. Psychiatric: Mood and affect are normal. Speech and behavior are normal.  ____________________________________________   LABS (all labs ordered are listed, but only abnormal results are displayed)  Results for orders placed or performed during the hospital encounter of 03/26/17 (from the past 24 hour(s))  Protime-INR     Status: Abnormal   Collection Time: 03/26/17 11:17 AM  Result Value Ref Range   Prothrombin Time 20.7 (H) 11.4 - 15.2 seconds   INR 1.80   APTT     Status: Abnormal   Collection Time: 03/26/17 11:17 AM  Result Value Ref Range   aPTT 43 (H) 24 - 36 seconds  CBC     Status: Abnormal   Collection Time: 03/26/17 11:17 AM  Result Value Ref Range   WBC 5.5 3.6 - 11.0 K/uL   RBC 3.92 3.80 - 5.20 MIL/uL   Hemoglobin 11.3 (L) 12.0 - 16.0 g/dL   HCT 29.5 (L) 62.1 - 30.8 %   MCV 85.6 80.0 - 100.0 fL   MCH 28.9 26.0 -  34.0 pg   MCHC 33.7 32.0 - 36.0 g/dL   RDW 16.1 09.6 - 04.5 %   Platelets 271 150 - 440 K/uL  Differential     Status: None   Collection Time: 03/26/17 11:17 AM  Result Value Ref Range   Neutrophils Relative % 65 %   Neutro Abs 3.6 1.4 - 6.5 K/uL   Lymphocytes Relative 23 %   Lymphs Abs 1.3 1.0 - 3.6 K/uL   Monocytes Relative 9 %   Monocytes Absolute 0.5 0.2 - 0.9 K/uL   Eosinophils Relative 2 %   Eosinophils Absolute 0.1 0 - 0.7 K/uL   Basophils Relative 1 %   Basophils Absolute 0.0 0 - 0.1 K/uL  Comprehensive metabolic panel     Status: Abnormal   Collection Time: 03/26/17 11:17 AM  Result Value Ref Range   Sodium 135 135 - 145 mmol/L   Potassium 4.3 3.5 - 5.1 mmol/L   Chloride 99 (L) 101 - 111 mmol/L   CO2 25 22 - 32 mmol/L   Glucose, Bld 192 (H) 65 - 99 mg/dL   BUN 17 6 - 20 mg/dL   Creatinine, Ser 4.09 (H) 0.44 - 1.00 mg/dL   Calcium 9.1 8.9 - 81.1 mg/dL   Total Protein 6.8 6.5 - 8.1  g/dL   Albumin 3.4 (L) 3.5 - 5.0 g/dL   AST 26 15 - 41 U/L   ALT 11 (L) 14 - 54 U/L   Alkaline Phosphatase 85 38 - 126 U/L   Total Bilirubin 0.5 0.3 - 1.2 mg/dL   GFR calc non Af Amer 44 (L) >60 mL/min   GFR calc Af Amer 51 (L) >60 mL/min   Anion gap 11 5 - 15  Troponin I     Status: None   Collection Time: 03/26/17 11:17 AM  Result Value Ref Range   Troponin I <0.03 <0.03 ng/mL  Urinalysis, Complete w Microscopic     Status: Abnormal   Collection Time: 03/26/17 12:01 PM  Result Value Ref Range   Color, Urine STRAW (A) YELLOW   APPearance CLEAR (A) CLEAR   Specific Gravity, Urine 1.006 1.005 - 1.030   pH 7.0 5.0 - 8.0   Glucose, UA NEGATIVE NEGATIVE mg/dL   Hgb urine dipstick NEGATIVE NEGATIVE   Bilirubin Urine NEGATIVE NEGATIVE   Ketones, ur NEGATIVE NEGATIVE mg/dL   Protein, ur NEGATIVE NEGATIVE mg/dL   Nitrite NEGATIVE NEGATIVE   Leukocytes, UA NEGATIVE NEGATIVE   RBC / HPF 0-5 0 - 5 RBC/hpf   WBC, UA 0-5 0 - 5 WBC/hpf   Bacteria, UA NONE SEEN NONE SEEN   Squamous Epithelial / LPF 0-5 (A) NONE SEEN   ____________________________________________  EKG My review and personal interpretation at Time: 12:01   Indication: cva  Rate: 80  Rhythm: afib Axis: normal Other: non specific st and t wave changes, no stemi ____________________________________________  RADIOLOGY  I personally reviewed all radiographic images ordered to evaluate for the above acute complaints and reviewed radiology reports and findings.  These findings were personally discussed with the patient.  Please see medical record for radiology report.  ____________________________________________   PROCEDURES  Procedure(s) performed:  Procedures    Critical Care performed: no ____________________________________________   INITIAL IMPRESSION / ASSESSMENT AND PLAN / ED COURSE  Pertinent labs & imaging results that were available during my care of the patient were reviewed by me and considered in my  medical decision making (see chart for details).  DDX: cva, tia, hypoglycemia, dehydration,  electrolyte abnormality, dissection, sepsis   Deklyn Trachtenberg Kaffenberger is a 83 y.o. who presents to the ED with left face and arm tingling that started around 8 AM this morning. Patient afebrile hemodynamic stable but does have what appears to be evidence of CVA. Currently symptoms seem to be gradually improving. Not a TPA Based on age and minimal symptoms at this point. No evidence of bleed on CT head. Her glucose is normal electrolytes otherwise reassuring. At this point I do feel the patient will benefit from admission for further evaluation and management. A spoke with neurology who does recommend repeat MRI and MRA. We'll continue anticoagulation.  Have discussed with the patient and available family all diagnostics and treatments performed thus far and all questions were answered to the best of my ability. The patient demonstrates understanding and agreement with plan.       ____________________________________________   FINAL CLINICAL IMPRESSION(S) / ED DIAGNOSES  Final diagnoses:  Left face and left arm tingling  Chronic atrial fibrillation (HCC)      NEW MEDICATIONS STARTED DURING THIS VISIT:  New Prescriptions   No medications on file     Note:  This document was prepared using Dragon voice recognition software and may include unintentional dictation errors.    Willy Eddy, MD 03/26/17 1246

## 2017-03-26 NOTE — H&P (Signed)
[facility] - [place] at [facility]   PATIENT NAME: Veronica Short    MR#:  [id number]  DATE OF BIRTH:  1923/10/23  DATE OF ADMISSION:  03/26/2017  PRIMARY CARE PHYSICIAN: Kandyce Rud, MD   REQUESTING/REFERRING PHYSICIAN:Robinson  CHIEF COMPLAINT:  Left facial numbnessand left upper extending to weakness  HISTORY OF PRESENT ILLNESS:  Veronica Short  is a 83 y.o. female with a known history of coronary artery disease, diverse modes, hypertension, hyponatremia and congestive heart failure dical problems is presenting to the ED with a chief complaint of left facial numbness and lt upper extremity is weak. She also has some word finding difficulties from last night but left facial numbness in the left upper extremity weakness started from 8 AM today.  PAST MEDICAL HISTORY:   Past Medical History:  Diagnosis Date  . Arthritis   . CAD (coronary artery disease)   . CHF (congestive heart failure) (HCC)   . Diabetes mellitus without complication (HCC)   . High cholesterol   . Hypertension     PAST SURGICAL HISTOIRY:   Past Surgical History:  Procedure Laterality Date  . CHOLECYSTECTOMY    . CORONARY ARTERY BYPASS GRAFT    . PACEMAKER INSERTION      SOCIAL HISTORY:   Social History  Substance Use Topics  . Smoking status: Never Smoker  . Smokeless tobacco: Never Used  . Alcohol use No    FAMILY HISTORY:  History reviewed. No pertinent family history.  DRUG ALLERGIES:   Allergies  Allergen Reactions  . Morphine Anxiety    NUMBNESS  . Penicillins Hives and Rash    Has patient had a PCN reaction causing immediate rash, facial/tongue/throat swelling, SOB or lightheadedness with hypotension: No Has patient had a PCN reaction causing severe rash involving mucus membranes or skin necrosis: No Has patient had a PCN reaction that required hospitalization: No Has patient had a PCN reaction occurring within the last 10 years: No If all of the above  answers are "NO", then may proceed with Cephalosporin use.     REVIEW OF SYSTEMS:  CONSTITUTIONAL: No fever, fatigue or weakness.  EYES: No blurred or double vision.  EARS, NOSE, AND THROAT: No tinnitus or ear pain.  RESPIRATORY: No cough, shortness of breath, wheezing or hemoptysis.  CARDIOVASCULAR: No chest pain, orthopnea, edema.  GASTROINTESTINAL: No nausea, vomiting, diarrhea or abdominal pain.  GENITOURINARY: No dysuria, hematuria.  ENDOCRINE: No polyuria, nocturia,  HEMATOLOGY: No anemia, easy bruising or bleeding SKIN: No rash or lesion. MUSCULOSKELETAL: No joint pain or arthritis.   NEUROLOGIC:left facial tingling and numbness and left upper extremity weakness  PSYCHIATRY: No anxiety or depression.   MEDICATIONS AT HOME:   Prior to Admission medications   Medication Sig Start Date End Date Taking? Authorizing Provider  apixaban (ELIQUIS) 5 MG TABS tablet Take 1 tablet (5 mg total) by mouth 2 (two) times daily. 01/04/15  Yes Tat, Onalee Hua, MD  atenolol (TENORMIN) 100 MG tablet Take 100 mg by mouth daily.  10/14/13  Yes [provider]  atorvastatin (LIPITOR) 40 MG tablet Take 1 tablet (40 mg total) by mouth at bedtime. 01/04/15  Yes Tat, Onalee Hua, MD  CALCIUM PO Take 2 tablets by mouth 2 (two) times daily.   Yes [provider]  Cholecalciferol (VITAMIN D PO) Take 1 tablet by mouth every morning.   Yes [provider]  DULoxetine (CYMBALTA) 30 MG capsule Take 30 mg by mouth daily.   Yes [provider]  fluticasone (FLONASE) 50 MCG/ACT nasal spray Place 2 sprays into both nostrils daily.   Yes [provider]  metFORMIN (GLUCOPHAGE-XR) 500 MG 24 hr tablet Take 500 mg by mouth daily with breakfast.  11/01/13  Yes [provider]  acetaminophen (TYLENOL) 500 MG tablet Take 1,000 mg by mouth every 6 (six) hours as needed for mild pain.    [provider]  albuterol (PROVENTIL HFA;VENTOLIN HFA) 108 (90 Base) MCG/ACT inhaler  Inhale 2 puffs into the lungs every 6 (six) hours as needed for wheezing or shortness of breath. 10/27/15   Leona Carry, MD  citalopram (CELEXA) 20 MG tablet Take 20 mg by mouth daily.  10/18/13   [provider]  diltiazem (CARDIZEM CD) 180 MG 24 hr capsule Take 180 mg by mouth daily.  11/01/13   [provider]  furosemide (LASIX) 20 MG tablet TAKE 1 TABLET EVERY DAY 08/19/16   [provider]  losartan (COZAAR) 25 MG tablet Take by mouth. 07/23/16 07/23/17  [provider]  nitroGLYCERIN (NITROSTAT) 0.4 MG SL tablet Place 0.4 mg under the tongue every 5 (five) minutes as needed for chest pain.    [provider]  Olopatadine HCl (PATADAY) 0.2 % SOLN Place 2 drops into both eyes every morning.    [provider]  traMADol (ULTRAM) 50 MG tablet Take 50 mg by mouth every 6 (six) hours as needed for moderate pain.    [provider]      VITAL SIGNS:  Blood pressure (!) 156/73, pulse 73, temperature 97.7 F (36.5 C), resp. rate 20, height  (1.422 m), weight 65.8 kg (145 lb), SpO2 97 %.  PHYSICAL EXAMINATION:  GENERAL:  83 y.o.-year-old patient lying in the bed with no acute distress.  EYES: Pupils equal, round, reactive to light and accommodation. No scleral icterus. Extraocular muscles intact.  HEENT: Head atraumatic, normocephalic. Oropharynx and nasopharynx clear.  NECK:  Supple, no jugular venous distention. No thyroid enlargement, no tenderness.  LUNGS: Normal breath sounds bilaterally, no wheezing, rales,rhonchi or crepitation. No use of accessory muscles of respiration.  CARDIOVASCULAR: S1, S2 normal. No murmurs, rubs, or gallops.  ABDOMEN: Soft, nontender, nondistended. Bowel sounds present. No organomegaly or mass.  EXTREMITIES: No pedal edema, cyanosis, or clubbing.  NEUROLOGIC: Cranial nerves II through XII are intact. Left side of the face with a decreased touch sensation. Motor 5 out of 5 in all extremities. Sensation  intact. Gait not checked. No dysarthria PSYCHIATRIC: The patient is alert and oriented x 3.  SKIN: No obvious rash, lesion, or ulcer.   LABORATORY PANEL:   CBC  Recent Labs Lab 03/26/17 1117  WBC 5.5  HGB 11.3*  HCT 33.6*  PLT 271   ------------------------------------------------------------------------------------------------------------------  Chemistries   Recent Labs Lab 03/26/17 1117  NA 135  K 4.3  CL 99*  CO2 25  GLUCOSE 192*  BUN 17  CREATININE 1.08*  CALCIUM 9.1  AST 26  ALT 11*  ALKPHOS 85  BILITOT 0.5   ------------------------------------------------------------------------------------------------------------------  Cardiac Enzymes  Recent Labs Lab 03/26/17 1117  TROPONINI <0.03   ------------------------------------------------------------------------------------------------------------------  RADIOLOGY:  Dg Chest 2 View  Result Date: 03/26/2017 CLINICAL DATA:  Numbness and tingling. Evaluate for mediastinal widening, pneumonia. EXAM: CHEST  2 VIEW COMPARISON:  Chest CT 02/25/2017 FINDINGS: Cardiomegaly. Aortic calcifications. No evidence of abnormal mediastinal widening. Mitral valve annular calcifications are noted. Left pacer in place with single lead tip in the right ventricle. Low lung volumes without confluent opacity or  effusion. Degenerative changes in the shoulders. No acute bony abnormality. IMPRESSION: Cardiomegaly.  Aortic calcifications.  No active disease. Electronically Signed   By: Charlett Nose M.D.   On: 03/26/2017 11:51   Ct Head Code Stroke Wo Contrast  Addendum Date: 03/26/2017   ADDENDUM REPORT: 03/26/2017 12:18 ADDENDUM: Study discussed by telephone with Dr. Willy Eddy on 03/26/2017 at 1207 hours. Electronically Signed   By: Odessa Fleming M.D.   On: 03/26/2017 12:18   Result Date: 03/26/2017 CLINICAL DATA:  Code stroke. 83 year old female with face and hand paresthesias onset at breakfast. EXAM: CT HEAD WITHOUT CONTRAST  TECHNIQUE: Contiguous axial images were obtained from the base of the skull through the vertex without intravenous contrast. COMPARISON:  [facility] CTA head and neck 01/03/2015 and head CT 01/02/2015 FINDINGS: Brain: No acute intracranial hemorrhage identified. No midline shift, mass effect, or evidence of intracranial mass lesion. No ventriculomegaly. Patchy and confluent bilateral cerebral white matter hypodensity appears mildly progressed bilaterally since 2016. Heterogeneity in the deep gray matter nuclei appears not significantly changed. No cortically based acute infarct identified. No cortical encephalomalacia identified. Vascular: Calcified atherosclerosis at the skull base. No suspicious intracranial vascular hyperdensity. Skull: No acute osseous abnormality identified. Hyperostosis of the calvarium, normal variant. Sinuses/Orbits: Chronic right sphenoid sinusitis. Well pneumatized otherwise. Other: No acute orbit or scalp soft tissue findings. ASPECTS [facility] Stroke Program Early CT Score) - Ganglionic level infarction (caudate, lentiform nuclei, internal capsule, insula, M1-M3 cortex): 7 - Supraganglionic infarction (M4-M6 cortex): 3 Total score (0-10 with 10 being normal): 10 IMPRESSION: 1. No acute cortically based infarct or acute intracranial hemorrhage identified. 2. ASPECTS is 10. 3. Progressed bilateral cerebral white matter changes since 2016 compatible with small vessel disease. Electronically Signed: By: Odessa Fleming M.D. On: 03/26/2017 12:04    EKG:   Orders placed or performed during the hospital encounter of 03/26/17  . ED EKG  . ED EKG  . EKG 12-Lead  . EKG 12-Lead    IMPRESSION AND PLAN:   Veronica Short  is a 83 y.o. female with a known history of coronary artery disease, diverse modes, hypertension, hyponatremia and congestive heart failure dical problems is presenting to the ED with a chief complaint of left facial numbness and lt upper extremity is weak. She also has  some word finding difficulties from last night but left facial numbness in the left upper extremity weakness started from 8 AM today.   # tia  Admit to MedSurg unit under observation status cT head is negative, not a candidate for TPA Will get complete stroke workup with MRI/MRA of the brain, carotid Dopplers and 2-D echocardiogram We will get neuro checks and neurology consult Patient has passed bedside swallow evaluation PT, OT valuation Aspirin  once daily Check TSH, hemoglobin A1c and lipid panel Needs tight control of the risk factors including hypertension  #Chronic  Atrial fibrilation -rate controlled Continue Cardizem CD,eliquis  #Essential hypertension Continue home medication atenolol, Cozaar, Cardizem CD and titrate as needed  #Hyperlipidemia Check fasting lipid panel and continue pitor 40 mg  Once daily  #Diabetes mellitus Metformin and  sliding scale insulin   DVT prophylaxis- Eliquis  All the records are reviewed and case discussed with ED provider. Management plans discussed with the patient, family and they are in agreement.  CODE STATUS: fc/ daughter Dalia Heading  TOTAL TIME TAKING CARE OF THIS PATIENT: 43  minutes.   Note: This dictation was prepared with Dragon dictation along with smaller phrase technology.  Any transcriptional errors that result from this process are unintentional.  Ramonita Lab M.D on 03/26/2017 at 1:24 PM  Between 7am to 6pm - Pager - [phone]  After 6pm go to [email or web address] - password [email or web address] [facility]  [facility]  Office  [phone]  CC: Primary care physician; Kandyce Rud, MD

## 2017-03-26 NOTE — ED Notes (Signed)
Unable to call report to the floor due to RN not available for report.

## 2017-03-26 NOTE — ED Notes (Addendum)
Pt transferred to commode, had unsteady gait. Fall risk assessment updated.

## 2017-03-27 ENCOUNTER — Observation Stay
Admit: 2017-03-27 | Discharge: 2017-03-27 | Disposition: A | Discharge status: Home / Self Care | Payer: Medicare HMO | Attending: Internal Medicine | Admitting: Internal Medicine

## 2017-03-27 DIAGNOSIS — R202 Paresthesia of skin: Secondary | ICD-10-CM | POA: Diagnosis not present

## 2017-03-27 DIAGNOSIS — I639 Cerebral infarction, unspecified: Secondary | ICD-10-CM | POA: Diagnosis not present

## 2017-03-27 DIAGNOSIS — G459 Transient cerebral ischemic attack, unspecified: Secondary | ICD-10-CM | POA: Diagnosis not present

## 2017-03-27 LAB — ECHOCARDIOGRAM COMPLETE
AO mean calculated velocity dopler: 128 cm/s
AOPV: 0.37 m/s
AV Area mean vel: 1.33 cm2
AV VEL mean LVOT/AV: 0.35
AV area mean vel ind: 0.79 cm2/m2
AV peak Index: 0.85
AV pk vel: 187 cm/s
AVAREAVTI: 1.42 cm2
AVAREAVTIIND: 0.73 cm2/m2
AVG: 7 mmHg
AVPG: 14 mmHg
CHL CUP AV VALUE AREA INDEX: 0.73
CHL CUP AV VEL: 1.23
CHL CUP DOP CALC LVOT VTI: 11.9 cm
CHL CUP LVOT MV VTI INDEX: 0.92 cm2/m2
FS: 30 % (ref 28–44)
HEIGHTINCHES: 56 in
IVS/LV PW RATIO, ED: 1.45
LA ID, A-P, ES: 42 mm
LA diam end sys: 42 mm
LA diam index: 2.5 cm/m2
LA vol A4C: 76.2 ml
LA vol index: 59.2 mL/m2
LA vol: 99.3 mL
LVOT MV VTI: 1.54
LVOT area: 3.8 cm2
LVOT peak VTI: 0.32 cm
LVOT peak vel: 69.9 cm/s
LVOTD: 22 mm
LVOTSV: 45 mL
MV M vel: 64.2
MVANNULUSVTI: 29.3 cm
Mean grad: 2 mmHg
PW: 9.33 mm — AB (ref 0.6–1.1)
RV LATERAL S' VELOCITY: 8.11 cm/s
VTI: 36.8 cm
Valve area: 1.23 cm2
WEIGHTICAEL: 2409.6 [oz_av]

## 2017-03-27 LAB — LIPID PANEL
CHOL/HDL RATIO: 3.7 ratio
CHOLESTEROL: 157 mg/dL (ref 0–200)
HDL: 43 mg/dL (ref >40)
LDL Cholesterol: 83 mg/dL (ref 0–99)
Triglycerides: 153 mg/dL — ABNORMAL HIGH (ref <150)
VLDL: 31 mg/dL (ref 0–40)

## 2017-03-27 LAB — HEMOGLOBIN A1C
Hgb A1c MFr Bld: 6.8 % — ABNORMAL HIGH (ref 4.8–5.6)
MEAN PLASMA GLUCOSE: 148.46 mg/dL

## 2017-03-27 LAB — GLUCOSE, CAPILLARY
Glucose-Capillary: 143 mg/dL — ABNORMAL HIGH (ref 65–99)
Glucose-Capillary: 176 mg/dL — ABNORMAL HIGH (ref 65–99)

## 2017-03-27 MED ORDER — INSULIN ASPART 100 UNIT/ML ~~LOC~~ SOLN: 0.0000 [IU] | Freq: Every day | SUBCUTANEOUS | Status: DC

## 2017-03-27 MED ORDER — INSULIN ASPART 100 UNIT/ML ~~LOC~~ SOLN
0.0000 [IU] | Freq: Three times a day (TID) | SUBCUTANEOUS | Status: DC
  Administered 2017-03-27: 2 [IU] via SUBCUTANEOUS
  Filled 2017-03-27: qty 1

## 2017-03-27 MED ORDER — SODIUM CHLORIDE 0.9 % IV BOLUS (SEPSIS)
500.0000 mL | Freq: Once | INTRAVENOUS | Status: AC
  Administered 2017-03-27: 15:00:00 500 mL via INTRAVENOUS

## 2017-03-27 NOTE — Progress Notes (Signed)
   03/27/17 1550 03/27/17 1552 03/27/17 1555  Vitals  BP (!) 143/60 126/60 (!) 104/59  MAP (mmHg) 82 76 65  BP Location --  Left Arm Left Arm  BP Method Automatic Automatic Automatic  Patient Position (if appropriate) Lying Sitting Standing  Pulse Rate 67 79 73   After pt received bolus orthostatic VS redone. They are as above. Dr. Elpidio Anis notified, no new orders received

## 2017-03-27 NOTE — Progress Notes (Signed)
Patient is to be discharged home with home health today. Patient is in no acute distress at this time, and assessment is unchanged from this morning. Patient's IV is out, discharge paperwork has been discussed with patient/family and there are no questions or concerns at this time. Patient will be accompanied downstairs by staff and family via wheelchair.     

## 2017-03-27 NOTE — Progress Notes (Signed)
   03/27/17 1136  Neurological  Neuro (WDL) X  Level of Consciousness Alert  Orientation Level Oriented X4  Cognition Memory impairment;Appropriate at baseline  Speech Clear  Pupil Assessment  Yes  R Pupil Size (mm) 3  R Pupil Shape Irregular  R Pupil Reaction Nonreactive (MD notified)  L Pupil Size (mm) 3  L Pupil Shape Irregular  L Pupil Reaction Nonreactive (MD notified)  Additional Pupil Assessments No  Motor Function/Sensation Assessment Grip;Dorsiflexion;Plantar flexion;Sensation  R Hand Grip Present;Moderate  L Hand Grip Present;Moderate   R Foot Dorsiflexion Present;Moderate  L Foot Dorsiflexion Present;Moderate  R Foot Plantar Flexion Present;Moderate  L Foot Plantar Flexion Present;Moderate  RUE Sensation Full sensation  LUE Sensation Numbness  RLE Sensation Full sensation  LLE Sensation Full sensation  Neuro Symptoms None   On neuro assessment pt's pupils were found to be fixed and irregular. Pt also complained of slight numbness to her L hand. Otherwise her neuro exam was unremarkable (see above). Dr. Elpidio Anis notified. No new orders received.

## 2017-03-27 NOTE — Consult Note (Signed)
Reason for Consult:L side numbness  Referring Physician: Dr. Elpidio Anis   CC: L sided numbness   HPI: Veronica Short is an 83 y.o. female with a known history of coronary artery disease, PPM hypertension, hyponatremia and congestive heart failure dical problems is presenting to the ED with a chief complaint of left facial numbness and lt upper extremity is weak.  Currently improved. Pt has L facial and LUE numbness NIHSS of 1. CTH no acute abnormalities.    Past Medical History:  Diagnosis Date  . Arthritis   . CAD (coronary artery disease)   . CHF (congestive heart failure) (HCC)   . Diabetes mellitus without complication (HCC)   . High cholesterol   . Hypertension     Past Surgical History:  Procedure Laterality Date  . CHOLECYSTECTOMY    . CORONARY ARTERY BYPASS GRAFT    . PACEMAKER INSERTION      History reviewed. No pertinent family history.  Social History:  reports that she has never smoked. She has never used smokeless tobacco. She reports that she does not drink alcohol or use drugs.  Allergies  Allergen Reactions  . Morphine Anxiety    NUMBNESS  . Penicillins Hives and Rash    Has patient had a PCN reaction causing immediate rash, facial/tongue/throat swelling, SOB or lightheadedness with hypotension: No Has patient had a PCN reaction causing severe rash involving mucus membranes or skin necrosis: No Has patient had a PCN reaction that required hospitalization: No Has patient had a PCN reaction occurring within the last 10 years: No If all of the above answers are "NO", then may proceed with Cephalosporin use.     Medications: I have reviewed the patient's current medications.  ROS: History obtained from the patient  General ROS: negative for - chills, fatigue, fever, night sweats, weight gain or weight loss Psychological ROS: negative for - behavioral disorder, hallucinations, memory difficulties, mood swings or suicidal ideation Ophthalmic ROS: negative for -  blurry vision, double vision, eye pain or loss of vision ENT ROS: negative for - epistaxis, nasal discharge, oral lesions, sore throat, tinnitus or vertigo Allergy and Immunology ROS: negative for - hives or itchy/watery eyes Hematological and Lymphatic ROS: negative for - bleeding problems, bruising or swollen lymph nodes Endocrine ROS: negative for - galactorrhea, hair pattern changes, polydipsia/polyuria or temperature intolerance Respiratory ROS: negative for - cough, hemoptysis, shortness of breath or wheezing Cardiovascular ROS: negative for - chest pain, dyspnea on exertion, edema or irregular heartbeat Gastrointestinal ROS: negative for - abdominal pain, diarrhea, hematemesis, nausea/vomiting or stool incontinence Genito-Urinary ROS: negative for - dysuria, hematuria, incontinence or urinary frequency/urgency Musculoskeletal ROS: negative for - joint swelling or muscular weakness Neurological ROS: as noted in HPI Dermatological ROS: negative for rash and skin lesion changes  Physical Examination: Blood pressure 132/77, pulse 82, temperature 98 F (36.7 C), temperature source Oral, resp. rate 18, height  (1.422 m), weight 68.3 kg (150 lb 9.6 oz), SpO2 97 %.    Neurological Examination   Mental Status: Alert, oriented, thought content appropriate.  Speech fluent without evidence of aphasia.  Able to follow 3 step commands without difficulty. Cranial Nerves: II: Discs flat bilaterally; Visual fields grossly normal, pupils equal, round, reactive to light and accommodation III,IV, VI: ptosis not present, extra-ocular motions intact bilaterally V,VII: smile symmetric, facial light touch sensation normal bilaterally VIII: hearing normal bilaterally IX,X: gag reflex present XI: bilateral shoulder shrug XII: midline tongue extension Motor: Right : Upper extremity   5/5  Left:     Upper extremity   5/5  Lower extremity   5/5     Lower extremity   5/5 Tone and bulk:normal tone  throughout; no atrophy noted Sensory: decreased sensation LUE Deep Tendon Reflexes: 2+ and symmetric throughout Plantars: Right: downgoing   Left: downgoing Cerebellar: normal finger-to-nose, normal rapid alternating movements and normal heel-to-shin test Gait: not tested       Laboratory Studies:   Basic Metabolic Panel:  Recent Labs Lab 03/26/17 1117  NA 135  K 4.3  CL 99*  CO2 25  GLUCOSE 192*  BUN 17  CREATININE 1.08*  CALCIUM 9.1    Liver Function Tests:  Recent Labs Lab 03/26/17 1117  AST 26  ALT 11*  ALKPHOS 85  BILITOT 0.5  PROT 6.8  ALBUMIN 3.4*   No results for input(s): LIPASE, AMYLASE in the last 168 hours. No results for input(s): AMMONIA in the last 168 hours.  CBC:  Recent Labs Lab 03/26/17 1117  WBC 5.5  NEUTROABS 3.6  HGB 11.3*  HCT 33.6*  MCV 85.6  PLT 271    Cardiac Enzymes:  Recent Labs Lab 03/26/17 1117  TROPONINI <0.03    BNP: Invalid input(s): POCBNP  CBG:  Recent Labs Lab 03/26/17 2029 03/27/17 0810 03/27/17 1201  GLUCAP 179* 143* 176*    Microbiology: No results found for this or any previous visit.  Coagulation Studies:  Recent Labs  03/26/17 1117  LABPROT 20.7*  INR 1.80    Urinalysis:  Recent Labs Lab 03/26/17 1201  COLORURINE STRAW*  LABSPEC 1.006  PHURINE 7.0  GLUCOSEU NEGATIVE  HGBUR NEGATIVE  BILIRUBINUR NEGATIVE  KETONESUR NEGATIVE  PROTEINUR NEGATIVE  NITRITE NEGATIVE  LEUKOCYTESUR NEGATIVE    Lipid Panel:     Component Value Date/Time   CHOL 157 03/27/2017 0455   CHOL 167 02/25/2012 0110   TRIG 153 (H) 03/27/2017 0455   TRIG 313 (H) 02/25/2012 0110   HDL 43 03/27/2017 0455   HDL 31 (L) 02/25/2012 0110   CHOLHDL 3.7 03/27/2017 0455   VLDL 31 03/27/2017 0455   VLDL 63 (H) 02/25/2012 0110   LDLCALC 83 03/27/2017 0455   LDLCALC 73 02/25/2012 0110    HgbA1C:  Lab Results  Component Value Date   HGBA1C 6.8 (H) 03/27/2017    Urine Drug Screen:  No results found  for: LABOPIA, COCAINSCRNUR, LABBENZ, AMPHETMU, THCU, LABBARB  Alcohol Level: No results for input(s): ETH in the last 168 hours.  Other results: EKG: a-fib.  Imaging: Dg Chest 2 View  Result Date: 03/26/2017 CLINICAL DATA:  Numbness and tingling. Evaluate for mediastinal widening, pneumonia. EXAM: CHEST  2 VIEW COMPARISON:  Chest CT 02/25/2017 FINDINGS: Cardiomegaly. Aortic calcifications. No evidence of abnormal mediastinal widening. Mitral valve annular calcifications are noted. Left pacer in place with single lead tip in the right ventricle. Low lung volumes without confluent opacity or effusion. Degenerative changes in the shoulders. No acute bony abnormality. IMPRESSION: Cardiomegaly.  Aortic calcifications.  No active disease. Electronically Signed   By: Charlett Nose M.D.   On: 03/26/2017 11:51   US Carotid Bilateral (at [facility] Only)  Result Date: 03/26/2017 CLINICAL DATA:  TIA. History of CAD (post myocardial infarction and CABG). History hyperlipidemia and diabetes. EXAM: BILATERAL CAROTID DUPLEX ULTRASOUND TECHNIQUE: Wallace Cullens scale imaging, color Doppler and duplex ultrasound were performed of bilateral carotid and vertebral arteries in the neck. COMPARISON:  None. FINDINGS: Criteria: Quantification of carotid stenosis is based on velocity parameters that correlate the  residual internal carotid diameter with NASCET-based stenosis levels, using the diameter of the distal internal carotid lumen as the denominator for stenosis measurement. The following velocity measurements were obtained: RIGHT ICA:  77/16 cm/sec CCA:  67/8 cm/sec SYSTOLIC ICA/CCA RATIO:  1.2 DIASTOLIC ICA/CCA RATIO:  2.1 ECA:  84 cm/sec LEFT ICA:  60/12 cm/sec CCA:  75/11 cm/sec SYSTOLIC ICA/CCA RATIO:  0.8 DIASTOLIC ICA/CCA RATIO:  1.1 ECA:  75 cm/sec RIGHT CAROTID ARTERY: There is a minimal amount of atherosclerotic plaque involving the origin and proximal aspects of the right internal carotid artery (image 25), not resulting  in elevated peak systolic velocities within the interrogated course the right internal carotid artery to suggest a hemodynamically significant stenosis. RIGHT VERTEBRAL ARTERY:  Antegrade flow LEFT CAROTID ARTERY: There is a minimal amount of atherosclerotic plaque within the left carotid bulb (image 52), extending to involve the origin and proximal aspects of the left internal carotid artery (image 60), not resulting elevated peak systolic velocities within the interrogated course the left internal carotid artery to suggest a hemodynamically significant stenosis. LEFT VERTEBRAL ARTERY:  Antegrade flow IMPRESSION: Minimal amount of bilateral atherosclerotic plaque, left greater than right, not resulting in a hemodynamically significant stenosis within either internal carotid artery. Electronically Signed   By: Simonne Come M.D.   On: 03/26/2017 17:58   Ct Head Code Stroke Wo Contrast  Addendum Date: 03/26/2017   ADDENDUM REPORT: 03/26/2017 12:18 ADDENDUM: Study discussed by telephone with Dr. Willy Eddy on 03/26/2017 at 1207 hours. Electronically Signed   By: Odessa Fleming M.D.   On: 03/26/2017 12:18   Result Date: 03/26/2017 CLINICAL DATA:  Code stroke. 83 year old female with face and hand paresthesias onset at breakfast. EXAM: CT HEAD WITHOUT CONTRAST TECHNIQUE: Contiguous axial images were obtained from the base of the skull through the vertex without intravenous contrast. COMPARISON:  [facility] CTA head and neck 01/03/2015 and head CT 01/02/2015 FINDINGS: Brain: No acute intracranial hemorrhage identified. No midline shift, mass effect, or evidence of intracranial mass lesion. No ventriculomegaly. Patchy and confluent bilateral cerebral white matter hypodensity appears mildly progressed bilaterally since 2016. Heterogeneity in the deep gray matter nuclei appears not significantly changed. No cortically based acute infarct identified. No cortical encephalomalacia identified. Vascular: Calcified  atherosclerosis at the skull base. No suspicious intracranial vascular hyperdensity. Skull: No acute osseous abnormality identified. Hyperostosis of the calvarium, normal variant. Sinuses/Orbits: Chronic right sphenoid sinusitis. Well pneumatized otherwise. Other: No acute orbit or scalp soft tissue findings. ASPECTS [facility] Stroke Program Early CT Score) - Ganglionic level infarction (caudate, lentiform nuclei, internal capsule, insula, M1-M3 cortex): 7 - Supraganglionic infarction (M4-M6 cortex): 3 Total score (0-10 with 10 being normal): 10 IMPRESSION: 1. No acute cortically based infarct or acute intracranial hemorrhage identified. 2. ASPECTS is 10. 3. Progressed bilateral cerebral white matter changes since 2016 compatible with small vessel disease. Electronically Signed: By: Odessa Fleming M.D. On: 03/26/2017 12:04     Assessment/Plan:  83 y.o. female with a known history of coronary artery disease, PPM hypertension, hyponatremia and congestive heart failure dical problems is presenting to the ED with a chief complaint of left facial numbness and lt upper extremity is weak.  Currently improved. Pt has L facial and LUE numbness NIHSS of 1. CTH no acute abnormalities.    - Afib on eliquis CTH no acute abnormalities Exam improved with subjective numbness of LUE and face  Carotid US no hemodynamic stenosis  No further imaging from neuro stand point  Pt/ot Cont'  eliquis. D/c plannint Delmer Kowalski  03/27/2017, 1:25 PM

## 2017-03-27 NOTE — Progress Notes (Signed)
Physical Therapy Evaluation Patient Details Name: EMERSEN MASCARI MRN: [id number] DOB: [birth year] Today's Date: 03/27/2017   History of Present Illness  Pt. is a 83 y.o. female who was admitted to [facility] with Left Facial numbness, and left UE weakness. Pt. PMHx includes: CAD, HTN, Hyponatremia, CHF, Arthritis, DM, High cholesterol, and HTN.   Clinical Impression  Pt is a pleasant 83 year old female who was admitted for a TIA. Pt performs bed mobility with supervision, transfers/ambulation with RW with CGA. Pt amb 30 feet within room with rest breaks in chair due to dizziness, pt is at baseline with amb short household distances and is easily SOB. Pt demonstrates deficits with LE strength/endurance and transfers/amb. Would benefit from skilled PT to address above deficits and promote optimal return to home. Recommend transition to HHPT upon DC from acute care hospitalization. This entire session was guided, instructed, and directly supervised by Elizabeth Palau, DPT.    Follow Up Recommendations Home health PT    Equipment Recommendations  None recommended by PT    Recommendations for Other Services       Precautions / Restrictions Precautions Precautions: Fall Restrictions Weight Bearing Restrictions: No      Mobility  Bed Mobility Overal bed mobility: Needs Assistance Bed Mobility: Supine to Sit     Supine to sit: Supervision     General bed mobility comments: Pt able to perform with safe technique  Transfers Overall transfer level: Needs assistance Equipment used: Rolling walker (2 wheeled) Transfers: Sit to/from Stand Sit to Stand: Min guard         General transfer comment: Pt able to transfer with RW, no cues needed for technique. Pt reported feeling dizzy upon standing.   Ambulation/Gait Ambulation/Gait assistance: (P) Min guard Ambulation Distance (Feet): 2 Feet Assistive device: Rolling walker (2 wheeled) Gait Pattern/deviations: Step-through pattern      General Gait Details: Pt reported feeling dizzy upon standing, amb with RW to take a seat in recliner. No cues needed.    Stairs            Wheelchair Mobility    Modified Rankin (Stroke Patients Only)       Balance Overall balance assessment: Needs assistance Sitting-balance support: Feet unsupported Sitting balance-Leahy Scale: Good Sitting balance - Comments: Pt able sit EOB with CGA, feet would not reach floor at lowest bed height, no cues needed.                                      Pertinent Vitals/Pain Pain Assessment: No/denies pain Pain Score: 5  Pain Intervention(s): Limited activity within patient's tolerance;Monitored during session;Repositioned    Home Living Family/patient expects to be discharged to:: Private residence Living Arrangements: Children Available Help at Discharge: Family;Available 24 hours/day Type of Home: House Home Access: Ramped entrance     Home Layout: One level Home Equipment: Walker - 4 wheels;Bedside commode      Prior Function Level of Independence: Independent with assistive device(s)         Comments: Pt. is independent with set-up for morning care. Pt. takes sinkside baths, daughter assists pt. with showering approximately 1x a week, Pt. sits at the rollator in the kitchen, and assist with light preparation. History of falls when standing to perform tasks. Utilizes a pillbox. which daughter assists with.     Hand Dominance   Dominant Hand: Right     Extremity/Trunk Assessment  Upper Extremity Assessment Upper Extremity Assessment: Generalized weakness (UE MMT grossly 4/5, sensation intact, able to perform RAMPs)    Lower Extremity Assessment Lower Extremity Assessment: Generalized weakness (LE MMT grossly 4/5, sensation intact, able to perform RAMPs)    Cervical / Trunk Assessment Cervical / Trunk Assessment: Normal  Communication   Communication: No difficulties  Cognition  Arousal/Alertness: Awake/alert Behavior During Therapy: WFL for tasks assessed/performed Overall Cognitive Status: Within Functional Limits for tasks assessed                                        General Comments      Exercises Other Exercises Other Exercises: Pt able to amb with upright posture and navigate RW around furniture in room, no cues needed. Pt amb 10 ft within room 3x, with rest breaks in the recliner.    Assessment/Plan    PT Assessment Patient needs continued PT services  PT Problem List Decreased strength;Decreased range of motion;Decreased mobility;Decreased knowledge of use of DME       PT Treatment Interventions DME instruction;Gait training;Therapeutic activities;Therapeutic exercise;Patient/family education    PT Goals (Current goals can be found in the Care Plan section)  Acute Rehab PT Goals Patient Stated Goal: To return home PT Goal Formulation: With patient Time For Goal Achievement: 04/10/17 Potential to Achieve Goals: Good    Frequency Min 2X/week   Barriers to discharge        Co-evaluation               AM-PAC PT "6 Clicks" Daily Activity  Outcome Measure Difficulty turning over in bed (including adjusting bedclothes, sheets and blankets)?: None Difficulty moving from lying on back to sitting on the side of the bed? : None Difficulty sitting down on and standing up from a chair with arms (e.g., wheelchair, bedside commode, etc,.)?: Unable Help needed moving to and from a bed to chair (including a wheelchair)?: A Little Help needed walking in hospital room?: A Little Help needed climbing 3-5 steps with a railing? : A Lot 6 Click Score: 17    End of Session Equipment Utilized During Treatment: Gait belt Activity Tolerance: Patient tolerated treatment well Patient left: in chair;with call bell/phone within reach;with chair alarm set;with family/visitor present Nurse Communication: Mobility status PT Visit Diagnosis:  Muscle weakness (generalized) (M62.81);Other abnormalities of gait and mobility (R26.89);Pain    Time: 1132-1150 PT Time Calculation (min) (ACUTE ONLY): 18 min   Charges:         PT G Codes:   PT G-Codes **NOT FOR INPATIENT CLASS** Functional Assessment Tool Used: AM-PAC 6 Clicks Basic Mobility Functional Limitation: Mobility: Walking and moving around Mobility: Walking and Moving Around Current Status ([id number]): At least 40 percent but less than 60 percent impaired, limited or restricted Mobility: Walking and Moving Around Goal Status [phone]): At least 20 percent but less than 40 percent impaired, limited or restricted  Renford Dills, SPT  Renford Dills 03/27/2017, 1:23 PM

## 2017-03-27 NOTE — Evaluation (Signed)
Occupational Therapy Evaluation Patient Details Name: Veronica Short MRN: [id number] DOB: 1924/03/26 Today's Date: 03/27/2017    History of Present Illness Pt. is a 83 y.o. female who was admitted to [facility] with Left Facial numbness, and left UE weakness. Pt. PMHx includes: CAD, HTN, Hyponatremia, CHF, Arthritis, DM, High cholesterol, and HTN.    Clinical Impression   Pt. Is a 83 y.o female who was admitted to [facility] with Left Facial numbness, and Left UE weakness. Pt. resides awith her daughter, and son in Social worker. Pt. Required assist for set -up for morning care tasks, and sinkside baths. Pt.'s daughter assisted pt. With  showers, and meals, and medication pillbox set-up. Pt. Presents with weakness, impaired balance, history of falls, and limited functional mobility with hinder her ability to complete ADL tasks. Pt. Will benefit from skilled OT services for ADL training, A/E training, work simplification techniques, and pt. Education about home modification, and DME. Pt. Plans to return home with her family upon discharge. Pt. Will benefit from follow-up OT services.    Follow Up Recommendations  Home health OT    Equipment Recommendations       Recommendations for Other Services       Precautions / Restrictions Precautions Precautions: (P) Fall Restrictions Weight Bearing Restrictions: No      Mobility Bed Mobility Overal bed mobility: (P) Needs Assistance Bed Mobility: (P) Supine to Sit     Supine to sit: (P) Supervision     General bed mobility comments: (P) Pt able to perform with safe technique  Transfers Overall transfer level: (P) Needs assistance Equipment used: (P) Rolling walker (2 wheeled) Transfers: (P) Sit to/from Stand                Balance                                           ADL either performed or assessed with clinical judgement   ADL Overall ADL's : Needs assistance/impaired Eating/Feeding: Set up;Bed level   Grooming:  Set up;Bed level   Upper Body Bathing: Minimal assistance   Lower Body Bathing: Moderate assistance   Upper Body Dressing : Minimal assistance   Lower Body Dressing: Moderate assistance               Functional mobility during ADLs:  (Deferred. Bedside eval.)       Vision Baseline Vision/History: Wears glasses Wears Glasses: At all times Patient Visual Report: No change from baseline       Perception     Praxis      Pertinent Vitals/Pain Pain Assessment: No/denies pain Pain Score: (P) 5  Pain Intervention(s): (P) Limited activity within patient's tolerance;Monitored during session;Repositioned     Hand Dominance Right   Extremity/Trunk Assessment Upper Extremity Assessment Upper Extremity Assessment: Generalized weakness (Arthritic changes in the digits.)   Lower Extremity Assessment Lower Extremity Assessment: (P) Generalized weakness (LE MMT grossly 4/5, sensation intact, able to perform RAMPs)   Cervical / Trunk Assessment Cervical / Trunk Assessment: (P) Normal   Communication Communication Communication: No difficulties   Cognition Arousal/Alertness: Awake/alert Behavior During Therapy: WFL for tasks assessed/performed Overall Cognitive Status: Within Functional Limits for tasks assessed  General Comments       Exercises     Shoulder Instructions      Home Living Family/patient expects to be discharged to:: Private residence Living Arrangements: Children Available Help at Discharge: Family;Available 24 hours/day Type of Home: House Home Access: Ramped entrance     Home Layout: One level     Bathroom Shower/Tub: Tub/shower unit;Curtain   Bathroom Toilet: Handicapped height     Home Equipment: Environmental consultant - 4 wheels;Bedside commode          Prior Functioning/Environment Level of Independence: Independent with assistive device(s)        Comments: Pt. is independent with set-up for  morning care. Pt. takes sinkside baths, daughter assists pt. with showering approximately 1x a week, Pt. sits at the rollator in the kitchen, and assist with light preparation. History of falls when standing to perform tasks. Utilizes a pillbox. which daughter assists with.        OT Problem List: Decreased strength;Pain;Impaired UE functional use;Decreased knowledge of use of DME or AE;Decreased range of motion;Decreased activity tolerance      OT Treatment/Interventions: Self-care/ADL training;Patient/family education;Therapeutic activities;DME and/or AE instruction;Neuromuscular education;Therapeutic exercise    OT Goals(Current goals can be found in the care plan section) Acute Rehab OT Goals Patient Stated Goal: To return home OT Goal Formulation: With patient Potential to Achieve Goals: Good  OT Frequency: Min 2X/week   Barriers to D/C:            Co-evaluation              AM-PAC PT "6 Clicks" Daily Activity     Outcome Measure Help from another person eating meals?: None Help from another person taking care of personal grooming?: A Little Help from another person toileting, which includes using toliet, bedpan, or urinal?: A Lot Help from another person bathing (including washing, rinsing, drying)?: A Lot Help from another person to put on and taking off regular upper body clothing?: A Little Help from another person to put on and taking off regular lower body clothing?: A Lot 6 Click Score: 16   End of Session    Activity Tolerance: Patient tolerated treatment well Patient left: in bed;with call bell/phone within reach;with bed alarm set  OT Visit Diagnosis: Muscle weakness (generalized) (M62.81);History of falling (Z91.81)                Time: 1020-1048 OT Time Calculation (min): 28 min Charges:  OT General Charges $OT Visit: 1 Visit OT Evaluation $OT Eval Moderate Complexity: 1 Mod G-Codes: OT G-codes **NOT FOR INPATIENT CLASS** Functional Limitation:  Self care Self Care Current Status (Z6109): At least 40 percent but less than 60 percent impaired, limited or restricted Self Care Goal Status (U0454): At least 1 percent but less than 20 percent impaired, limited or restricted   Olegario Messier, MS, OTR/L   Olegario Messier, MS, OTR/L 03/27/2017, 12:55 PM

## 2017-03-27 NOTE — Progress Notes (Signed)
SLP Cancellation Note  Patient Details Name: Veronica Short MRN: [id number] DOB: 01/03/1924   Cancelled treatment:       Reason Eval/Treat Not Completed: Patient at procedure or test/unavailable Attempted speech/language evaluation. Pt is currently out of room for a procedure. Will re-attempt later as pt is available.   Lauree Chandler, [place], [employer]  Speech-Language Pathologist    [place],[name] 03/27/2017, 9:41 AM

## 2017-03-27 NOTE — Evaluation (Signed)
Speech Language Pathology Evaluation Patient Details Name: Veronica Short MRN: [id number] DOB: 1924-05-07 Today's Date: 03/27/2017 Time: 9528-4132 SLP Time Calculation (min) (ACUTE ONLY): 50 min  Problem List:  Patient Active Problem List   Diagnosis Date Noted  . Speech disorder 01/31/2015  . Closed 2-part nondisplaced fracture of surgical neck of left humerus 01/07/2015  . Rotator cuff arthropathy 01/07/2015  . Left rotator cuff tear arthropathy 01/07/2015  . TIA (transient ischemic attack) 01/03/2015  . CAD (coronary artery disease) 01/03/2015  . Diabetes mellitus without complication (HCC) 01/03/2015  . Hypertension 01/03/2015  . High cholesterol 01/03/2015  . Cerebral thrombosis with cerebral infarction (HCC) 01/03/2015  . Cerebral infarction due to thrombosis of cerebral artery (HCC) 01/03/2015  . Transient cerebral ischemia 01/03/2015  . Atrial fibrillation, unspecified   . Speech disturbance   . Osteoarthritis 03/23/2014  . Aortic valve disorder 11/20/2013  . CAD (coronary artery disease), native coronary artery 11/20/2013  . DDD (degenerative disc disease), lumbosacral 11/20/2013  . Obesity, unspecified 11/20/2013  . Osteoporosis 11/20/2013  . Type II or unspecified type diabetes mellitus without mention of complication, not stated as uncontrolled 11/20/2013  . Atrial fibrillation (HCC) 11/01/2013  . Other and unspecified hyperlipidemia 11/01/2013  . S/P CABG x 4 11/01/2013  . Pacemaker 02/22/2012  . H/O cardiac catheterization 11/10/1993  . MI (myocardial infarction) (HCC) 01/15/1991   Past Medical History:  Past Medical History:  Diagnosis Date  . Arthritis   . CAD (coronary artery disease)   . CHF (congestive heart failure) (HCC)   . Diabetes mellitus without complication (HCC)   . High cholesterol   . Hypertension    Past Surgical History:  Past Surgical History:  Procedure Laterality Date  . CHOLECYSTECTOMY    . CORONARY ARTERY BYPASS GRAFT    .  PACEMAKER INSERTION     HPI:  Veronica Short  is a 83 y.o. female with a known history of coronary artery disease, diverse modes, hypertension, hyponatremia and congestive heart failure dical problems is presenting to the ED with a chief complaint of left facial numbness and lt upper extremity is weak. She also has some word finding difficulties from last night but left facial numbness in the left upper extremity weakness started from 8 AM today.   Assessment / Plan / Recommendation Clinical Impression  Pt presents w/mild dysarthria c/b decreased breath support and low vocal intensity. Oral mech exam revealed mild left facial asymmetry and decreased left facial sensation. No lingual weakness oberved. Pt observed to have decreased breath support in speech which resulted in increased pauses to take breaths as well as decreased vocal intensity and hoarse vocal quality which minimally affected pt's intelligibility. Articulation appeared [facility], however strategies such as increased vocal intensity would benefit speech clarity. No expressive or receptive deficits were observed. No cognitive-communication deficits noted. Pt was able to to converse appropriately in conversation with ST. Discussed speech strategies with pt such as increasing vocal intensity. Would recommend home health or outpatient ST if needed upon discharge. Pt also reported some swallowing difficulties which were per her report a globus sensation as well as food/liquid coming back up. Per daughter report pt had been scheduled for a BA swallow prior to admission. Encouraged pt to f/u w/her GI upon discharge.     SLP Assessment  SLP Recommendation/Assessment: All further Speech Lanaguage Pathology  needs can be addressed in the next venue of care SLP Visit Diagnosis: Dysarthria and anarthria (R47.1)    Follow Up Recommendations  Home health  SLP;Other (comment) (As needed upon discharge)    Frequency and Duration   As needed upon discharge          SLP Evaluation Cognition  Overall Cognitive Status: Within Functional Limits for tasks assessed Orientation Level: Oriented X4 Memory: Appears intact Awareness: Appears intact       Comprehension  Auditory Comprehension Overall Auditory Comprehension: Appears within functional limits for tasks assessed Yes/No Questions: Within Functional Limits Commands: Within Functional Limits Conversation: Complex Visual Recognition/Discrimination Discrimination: Not tested Reading Comprehension Reading Status: Not tested    Expression Expression Primary Mode of Expression: Verbal Verbal Expression Overall Verbal Expression: Appears within functional limits for tasks assessed Initiation: No impairment Repetition: No impairment Naming: No impairment Pragmatics: No impairment Written Expression Dominant Hand: Right Written Expression: Not tested   Oral / Motor  Oral Motor/Sensory Function Overall Oral Motor/Sensory Function: Mild impairment Facial ROM: Within Functional Limits Facial Symmetry: Abnormal symmetry left Facial Strength: Within Functional Limits Facial Sensation: Reduced left Lingual ROM: Within Functional Limits Lingual Symmetry: Within Functional Limits Lingual Strength: Within Functional Limits Lingual Sensation: Within Functional Limits Velum: Within Functional Limits Mandible: Within Functional Limits Motor Speech Overall Motor Speech: Impaired (Minimal) Respiration: Impaired Level of Impairment: Sentence Phonation: Low vocal intensity Resonance: Within functional limits Articulation: Within functional limitis Intelligibility: Intelligible Motor Planning: Witnin functional limits Motor Speech Errors: Not applicable Effective Techniques: Increased vocal intensity   GO          Functional Assessment Tool Used: Clinical judgement Functional Limitations: Motor speech Motor Speech Current Status [phone]): At least 1 percent but less than 20 percent impaired,  limited or restricted Motor Speech Goal Status [phone]): At least 1 percent but less than 20 percent impaired, limited or restricted           Veronica,Short 03/27/2017, 1:18 PM

## 2017-03-27 NOTE — Progress Notes (Signed)
Pt's daughter came out to desk and asked for me to come see her. When I entered the room she was complaining of dizziness but denied CP, SOB, h/a, feeling faint, and vision changes. I collected VS and called Dr. Elpidio Anis. He requested that I obtain orthostatic VS. They were 101/54 (sitting) and 79/31 (standing). Dr. Elpidio Anis notifed. Per his order will administer of NS and recheck orthostatic VS once the fluid has been administered.

## 2017-03-27 NOTE — Care Management Note (Signed)
Case Management Note  Patient Details  Name: TIOMBE TOMEO MRN: [id number] Date of Birth: 07-Feb-1924  Subjective/Objective:         A referral for Home Health was discussed with daughter. Mrs Lisbon has had home health services in the past but has no agency preferences. A referral was called to Anibal Henderson at [facility] requesting HH-PT. Anticipate discharge home tomorrow.            Action/Plan:   Expected Discharge Date:  03/28/17               Expected Discharge Plan:  Home w Home Health Services  In-House Referral:     Discharge planning Services  CM Consult  Post Acute Care Choice:    Choice offered to:  Adult Children  DME Arranged:  N/A DME Agency:  NA  HH Arranged:  PT HH Agency:  [facility]  Status of Service:  Completed, signed off  If discussed at [employer], dates discussed:    Additional Comments:  Miko Sirico A, RN 03/27/2017, 2:26 PM

## 2017-03-29 DIAGNOSIS — M1990 Unspecified osteoarthritis, unspecified site: Secondary | ICD-10-CM | POA: Diagnosis not present

## 2017-03-29 DIAGNOSIS — I110 Hypertensive heart disease with heart failure: Secondary | ICD-10-CM | POA: Diagnosis not present

## 2017-03-29 DIAGNOSIS — I509 Heart failure, unspecified: Secondary | ICD-10-CM | POA: Diagnosis not present

## 2017-03-29 DIAGNOSIS — M5137 Other intervertebral disc degeneration, lumbosacral region: Secondary | ICD-10-CM | POA: Diagnosis not present

## 2017-03-29 DIAGNOSIS — Z8673 Personal history of transient ischemic attack (TIA), and cerebral infarction without residual deficits: Secondary | ICD-10-CM | POA: Diagnosis not present

## 2017-03-29 DIAGNOSIS — E119 Type 2 diabetes mellitus without complications: Secondary | ICD-10-CM | POA: Diagnosis not present

## 2017-03-31 DIAGNOSIS — M1990 Unspecified osteoarthritis, unspecified site: Secondary | ICD-10-CM | POA: Diagnosis not present

## 2017-03-31 DIAGNOSIS — M5137 Other intervertebral disc degeneration, lumbosacral region: Secondary | ICD-10-CM | POA: Diagnosis not present

## 2017-03-31 DIAGNOSIS — I110 Hypertensive heart disease with heart failure: Secondary | ICD-10-CM | POA: Diagnosis not present

## 2017-03-31 DIAGNOSIS — E119 Type 2 diabetes mellitus without complications: Secondary | ICD-10-CM | POA: Diagnosis not present

## 2017-03-31 DIAGNOSIS — Z8673 Personal history of transient ischemic attack (TIA), and cerebral infarction without residual deficits: Secondary | ICD-10-CM | POA: Diagnosis not present

## 2017-03-31 DIAGNOSIS — I509 Heart failure, unspecified: Secondary | ICD-10-CM | POA: Diagnosis not present

## 2017-04-02 ENCOUNTER — Ambulatory Visit
Admission: RE | Admit: 2017-04-02 | Discharge: 2017-04-02 | Disposition: A | Discharge status: Home / Self Care | Payer: Medicare HMO | Source: Ambulatory Visit | Attending: Specialist | Admitting: Specialist

## 2017-04-02 DIAGNOSIS — R1312 Dysphagia, oropharyngeal phase: Secondary | ICD-10-CM | POA: Diagnosis not present

## 2017-04-02 DIAGNOSIS — T17308A Unspecified foreign body in larynx causing other injury, initial encounter: Secondary | ICD-10-CM | POA: Diagnosis not present

## 2017-04-02 NOTE — Therapy (Signed)
[facility] DIAGNOSTIC RADIOLOGY [address] [place], [place], [address] Phone: [phone]   Fax:     Modified Barium Swallow  Patient Details  Name: Veronica Short MRN: [id number] Date of Birth: Dec 09, 1923 No Data Recorded  Encounter Date: 04/02/2017      End of Session - 04/02/17 1329    Visit Number 1   Number of Visits 1   Date for SLP Re-Evaluation 04/02/17   SLP Start Time 1245   SLP Stop Time  1329   SLP Time Calculation (min) 44 min   Activity Tolerance Patient tolerated treatment well      Past Medical History:  Diagnosis Date  . Arthritis   . CAD (coronary artery disease)   . CHF (congestive heart failure) (HCC)   . Diabetes mellitus without complication (HCC)   . High cholesterol   . Hypertension     Past Surgical History:  Procedure Laterality Date  . CHOLECYSTECTOMY    . CORONARY ARTERY BYPASS GRAFT    . PACEMAKER INSERTION      There were no vitals filed for this visit.   Subjective: Patient behavior: (alertness, ability to follow instructions, etc.): The patient is able to follow simple directions  Chief complaint: Originally scheduled for 03/03/2017 secondary chest CT that "could imply sequelae to aspiration"   Objective:  Radiological Procedure: A videoflouroscopic evaluation of oral-preparatory, reflex initiation, and pharyngeal phases of the swallow was performed; as well as a screening of the upper esophageal phase.  I. POSTURE: Upright in MBS chair  II. VIEW: Lateral  III. COMPENSATORY STRATEGIES: N/A  IV. BOLUSES ADMINISTERED:   Thin Liquid: 2 cup rim, 3 rapid, consecutive straw    Nectar-thick Liquid: 1 moderate   Honey-thick Liquid: DNT   Puree: 2 teaspoon presentations   Mechanical Soft: 1/4 graham cracker in applesauce  V. RESULTS OF EVALUATION: A. ORAL PREPARATORY PHASE: (The lips, tongue, and velum are observed for strength and coordination)       **Overall Severity Rating:  Mild, Slow, disorganized oral management; slow but adequate mastication of  graham cracker in apple sauce.  B. SWALLOW INITIATION/REFLEX: (The reflex is normal if "triggered" by the time the bolus reached the base of the tongue)  **Overall Severity Rating: Within normal limits  C. PHARYNGEAL PHASE: (Pharyngeal function is normal if the bolus shows rapid, smooth, and continuous transit through the pharynx and there is no pharyngeal residue after the swallow)  **Overall Severity Rating: Within normal limits  D. LARYNGEAL PENETRATION: (Material entering into the laryngeal inlet/vestibule but not aspirated) X1 on first liquid (nectar-thick) sip  E. ASPIRATION:  None  F. ESOPHAGEAL PHASE: (Screening of the upper esophagus): In the cervical esophagus there is a finger-like protrusion along the posterior wall during swallow (does not impede flow of boluses) consistent with prominent cricopharyngeus.    ASSESSMENT: This 83 year old woman; with concern for possible aspiration; is presenting with minimal oropharyngeal dysphagia.  Oral control of the bolus including oral hold, rotary mastication, and anterior to posterior transfer are mildly disorganized and slowed. Timing of the pharyngeal swallow is within normal limits.  Aspects of the pharyngeal stage of swallowing including tongue base retraction, hyolaryngeal excursion, epiglottic inversion, and duration/amplitude of UES opening are within normal limits.  There was one episode of laryngeal penetration (nectar-thick liquid).  There was no penetration with thin liquid- via cup rim or straw.  There is no observed pharyngeal residue or tracheal aspiration.  In the cervical esophagus there  is a finger-like protrusion along the posterior wall during swallow (does not impede flow of boluses) consistent with prominent cricopharyngeus.  The patient reported to me that her primary concern was "when I lie on my back the reflux comes up in my throat and I can't  breath".  PLAN/RECOMMENDATIONS:   A. Diet: Regular (soften and moisten as need for comfort)   B. Swallowing Precautions: Reflux precautions   C. Recommended consultation to: follow up with GI as recommended   D. Therapy recommendations: speech therapy is not indicated for oropharyngeal swallowing treatment   E. Results and recommendations were discussed with the pateitn immediately following the study (doubtful she fully understands) and the final report routed to the referring MD and GI.    Oropharyngeal dysphagia - Plan: DG OP Swallowing Func-Medicare/Speech Path, DG OP Swallowing Func-Medicare/Speech Path      G-Codes - 04-27-2017 1330    Functional Assessment Tool Used MBS, Clinical judgement   Functional Limitations Swallowing   Swallow Current Status (Z6109) At least 1 percent but less than 20 percent impaired, limited or restricted   Swallow Goal Status (U0454) At least 1 percent but less than 20 percent impaired, limited or restricted   Swallow Discharge Status [phone]) At least 1 percent but less than 20 percent impaired, limited or restricted          Problem List Patient Active Problem List   Diagnosis Date Noted  . Speech disorder 01/31/2015  . Closed 2-part nondisplaced fracture of surgical neck of left humerus 01/07/2015  . Rotator cuff arthropathy 01/07/2015  . Left rotator cuff tear arthropathy 01/07/2015  . TIA (transient ischemic attack) 01/03/2015  . CAD (coronary artery disease) 01/03/2015  . Diabetes mellitus without complication (HCC) 01/03/2015  . Hypertension 01/03/2015  . High cholesterol 01/03/2015  . Cerebral thrombosis with cerebral infarction (HCC) 01/03/2015  . Cerebral infarction due to thrombosis of cerebral artery (HCC) 01/03/2015  . Transient cerebral ischemia 01/03/2015  . Atrial fibrillation, unspecified   . Speech disturbance   . Osteoarthritis 03/23/2014  . Aortic valve disorder 11/20/2013  . CAD (coronary artery disease), native  coronary artery 11/20/2013  . DDD (degenerative disc disease), lumbosacral 11/20/2013  . Obesity, unspecified 11/20/2013  . Osteoporosis 11/20/2013  . Type II or unspecified type diabetes mellitus without mention of complication, not stated as uncontrolled 11/20/2013  . Atrial fibrillation (HCC) 11/01/2013  . Other and unspecified hyperlipidemia 11/01/2013  . S/P CABG x 4 11/01/2013  . Pacemaker 02/22/2012  . H/O cardiac catheterization 11/10/1993  . MI (myocardial infarction) (HCC) 01/15/1991   Dollene Primrose, MS/CCC- SLP  Leandrew Koyanagi 2017/04/27, 1:31 PM  [facility] DI[facility]STIC RADIOLOGY [address] [place], [place], [address] Phone: [phone]   Fax:     Name: Veronica Short MRN: [id number] Date of Birth: 1924-01-03

## 2017-04-05 NOTE — Discharge Summary (Signed)
[employer] - [place] at [facility]   PATIENT NAME: Veronica Short    MR#:  [id number]  DATE OF BIRTH:  Oct 05, 1923  DATE OF ADMISSION:  03/26/2017 ADMITTING PHYSICIAN: Ramonita Lab, MD  DATE OF DISCHARGE: 03/27/2017  5:16 PM  PRIMARY CARE PHYSICIAN: Kandyce Rud, MD   ADMISSION DIAGNOSIS:  Chronic atrial fibrillation (HCC) [I48.2] Left face and left arm tingling [R20.2]  DISCHARGE DIAGNOSIS:  Active Problems:   TIA (transient ischemic attack)   SECONDARY DIAGNOSIS:   Past Medical History:  Diagnosis Date  . Arthritis   . CAD (coronary artery disease)   . CHF (congestive heart failure) (HCC)   . Diabetes mellitus without complication (HCC)   . High cholesterol   . Hypertension      ADMITTING HISTORY  HISTORY OF PRESENT ILLNESS:  Veronica Short  is a 83 y.o. female with a known history of coronary artery disease, diverse modes, hypertension, hyponatremia and congestive heart failure dical problems is presenting to the ED with a chief complaint of left facial numbness and lt upper extremity is weak. She also has some word finding difficulties from last night but left facial numbness in the left upper extremity weakness started from 8 AM today.   HOSPITAL COURSE:   * Acute CVA Patient was admitted to medical floor with cardiac monitoring. Neuro checks. Eliquis was continued. She could not get an MRI due to having a pacemaker. Seen by neurology. Carotid ultrasound showed nothing acute. No thrombus on echocardiogram. Dr. Loretha Brasil of neurology suggested continuing Eliquis. Patient was seen by PT OT and speech. Discharged home with home health. Follow-up with primary care physician in one week.  Other comorbidities remained stable.  CONSULTS OBTAINED:  Treatment Team:  Pauletta Browns, MD  DRUG ALLERGIES:   Allergies  Allergen Reactions  . Morphine Anxiety    NUMBNESS  . Penicillins Hives and Rash    Has patient had a PCN reaction causing  immediate rash, facial/tongue/throat swelling, SOB or lightheadedness with hypotension: No Has patient had a PCN reaction causing severe rash involving mucus membranes or skin necrosis: No Has patient had a PCN reaction that required hospitalization: No Has patient had a PCN reaction occurring within the last 10 years: No If all of the above answers are "NO", then may proceed with Cephalosporin use.     DISCHARGE MEDICATIONS:   Discharge Medication List as of 03/27/2017  4:54 PM    CONTINUE these medications which have NOT CHANGED   Details  apixaban (ELIQUIS) 5 MG TABS tablet Take 1 tablet (5 mg total) by mouth 2 (two) times daily., Starting Fri 01/04/2015, Normal    atenolol (TENORMIN) 100 MG tablet Take 100 mg by mouth daily. , Starting 10/14/2013, Until Discontinued, Historical Med    atorvastatin (LIPITOR) 40 MG tablet Take 1 tablet (40 mg total) by mouth at bedtime., Starting Fri 01/04/2015, Normal    CALCIUM PO Take 2 tablets by mouth 2 (two) times daily., Until Discontinued, Historical Med    Cholecalciferol (VITAMIN D PO) Take 1 tablet by mouth every morning., Until Discontinued, Historical Med    DULoxetine (CYMBALTA) 30 MG capsule Take 30 mg by mouth daily., Historical Med    fluticasone (FLONASE) 50 MCG/ACT nasal spray Place 2 sprays into both nostrils daily., Until Discontinued, Historical Med    metFORMIN (GLUCOPHAGE-XR) 500 MG 24 hr tablet Take 500 mg by mouth daily with breakfast. , Starting 11/01/2013, Until Discontinued, Historical Med    acetaminophen (TYLENOL) 500 MG tablet Take 1,000  mg by mouth every 6 (six) hours as needed for mild pain., Until Discontinued, Historical Med    albuterol (PROVENTIL HFA;VENTOLIN HFA) 108 (90 Base) MCG/ACT inhaler Inhale 2 puffs into the lungs every 6 (six) hours as needed for wheezing or shortness of breath., Starting Sun 10/27/2015, Print    nitroGLYCERIN (NITROSTAT) 0.4 MG SL tablet Place 0.4 mg under the tongue every 5 (five) minutes  as needed for chest pain., Until Discontinued, Historical Med    traMADol (ULTRAM) 50 MG tablet Take 50 mg by mouth every 6 (six) hours as needed for moderate pain., Until Discontinued, Historical Med      STOP taking these medications     citalopram (CELEXA) 20 MG tablet      diltiazem (CARDIZEM CD) 180 MG 24 hr capsule      furosemide (LASIX) 20 MG tablet      losartan (COZAAR) 25 MG tablet      Olopatadine HCl (PATADAY) 0.2 % SOLN         Today   VITAL SIGNS:  Blood pressure (!) 104/59, pulse 73, temperature 98 F (36.7 C), temperature source Oral, resp. rate 18, height  (1.422 m), weight 68.3 kg (150 lb 9.6 oz), SpO2 97 %.  I/O:  No intake or output data in the 24 hours ending 04/05/17 1642  PHYSICAL EXAMINATION:  Physical Exam  GENERAL:  83 y.o.-year-old patient lying in the bed with no acute distress.  LUNGS: Normal breath sounds bilaterally, no wheezing, rales,rhonchi or crepitation. No use of accessory muscles of respiration.  CARDIOVASCULAR: S1, S2 normal. No murmurs, rubs, or gallops.  ABDOMEN: Soft, non-tender, non-distended. Bowel sounds present. No organomegaly or mass.  NEUROLOGIC: Moves all 4 extremities. PSYCHIATRIC: The patient is alert and oriented x 3.  SKIN: No obvious rash, lesion, or ulcer.   DATA REVIEW:   CBC No results for input(s): WBC, HGB, HCT, PLT in the last 168 hours.  Chemistries  No results for input(s): NA, K, CL, CO2, GLUCOSE, BUN, CREATININE, CALCIUM, MG, AST, ALT, ALKPHOS, BILITOT in the last 168 hours.  Invalid input(s): GFRCGP  Cardiac Enzymes No results for input(s): TROPONINI in the last 168 hours.  Microbiology Results  No results found for this or any previous visit.  RADIOLOGY:  No results found.  Follow up with PCP in 1 week.  Management plans discussed with the patient, family and they are in agreement.  CODE STATUS:  Code Status History    Date Active Date Inactive Code Status Order ID Comments User  Context   03/26/2017  2:21 PM 03/27/2017  8:31 PM Full Code [id number]  Ramonita Lab, MD Inpatient   01/03/2015  1:57 AM 01/04/2015  5:20 PM Full Code [id number]  Ron Parker, MD Inpatient    Advance Directive Documentation     Most Recent Value  Type of Advance Directive  Healthcare Power of Attorney, Living will  Pre-existing out of facility DNR order (yellow form or pink MOST form)  -  "MOST" Form in Place?  -      TOTAL TIME TAKING CARE OF THIS PATIENT ON DAY OF DISCHARGE: more than 30 minutes.   Milagros Loll R M.D on 04/05/2017 at 4:42 PM  Between 7am to 6pm - Pager - [phone]  After 6pm go to [email or web address] - password [email or web address] [facility]  [facility]  Office  [phone]  CC: Primary care physician; Kandyce Rud, MD  Note: This dictation was prepared with Dragon dictation along with smaller phrase technology. Any  transcriptional errors that result from this process are unintentional.

## 2017-04-06 DIAGNOSIS — I110 Hypertensive heart disease with heart failure: Secondary | ICD-10-CM | POA: Diagnosis not present

## 2017-04-06 DIAGNOSIS — M1990 Unspecified osteoarthritis, unspecified site: Secondary | ICD-10-CM | POA: Diagnosis not present

## 2017-04-06 DIAGNOSIS — I509 Heart failure, unspecified: Secondary | ICD-10-CM | POA: Diagnosis not present

## 2017-04-06 DIAGNOSIS — E119 Type 2 diabetes mellitus without complications: Secondary | ICD-10-CM | POA: Diagnosis not present

## 2017-04-06 DIAGNOSIS — Z8673 Personal history of transient ischemic attack (TIA), and cerebral infarction without residual deficits: Secondary | ICD-10-CM | POA: Diagnosis not present

## 2017-04-06 DIAGNOSIS — M5137 Other intervertebral disc degeneration, lumbosacral region: Secondary | ICD-10-CM | POA: Diagnosis not present

## 2017-04-08 DIAGNOSIS — I482 Chronic atrial fibrillation: Secondary | ICD-10-CM | POA: Diagnosis not present

## 2017-04-09 ENCOUNTER — Emergency Department
Admission: EM | Admit: 2017-04-09 | Discharge: 2017-04-09 | Disposition: A | Discharge status: Home / Self Care | Payer: Medicare HMO | Attending: Emergency Medicine | Admitting: Emergency Medicine

## 2017-04-09 ENCOUNTER — Emergency Department: Payer: Medicare HMO

## 2017-04-09 DIAGNOSIS — E119 Type 2 diabetes mellitus without complications: Secondary | ICD-10-CM | POA: Diagnosis not present

## 2017-04-09 DIAGNOSIS — W0110XA Fall on same level from slipping, tripping and stumbling with subsequent striking against unspecified object, initial encounter: Secondary | ICD-10-CM | POA: Diagnosis not present

## 2017-04-09 DIAGNOSIS — W19XXXA Unspecified fall, initial encounter: Secondary | ICD-10-CM

## 2017-04-09 DIAGNOSIS — Z7901 Long term (current) use of anticoagulants: Secondary | ICD-10-CM | POA: Diagnosis not present

## 2017-04-09 DIAGNOSIS — Z79899 Other long term (current) drug therapy: Secondary | ICD-10-CM | POA: Insufficient documentation

## 2017-04-09 DIAGNOSIS — S0990XA Unspecified injury of head, initial encounter: Secondary | ICD-10-CM | POA: Diagnosis not present

## 2017-04-09 DIAGNOSIS — M25559 Pain in unspecified hip: Secondary | ICD-10-CM | POA: Diagnosis not present

## 2017-04-09 DIAGNOSIS — Z8673 Personal history of transient ischemic attack (TIA), and cerebral infarction without residual deficits: Secondary | ICD-10-CM | POA: Diagnosis not present

## 2017-04-09 DIAGNOSIS — S0003XA Contusion of scalp, initial encounter: Secondary | ICD-10-CM | POA: Insufficient documentation

## 2017-04-09 DIAGNOSIS — M1990 Unspecified osteoarthritis, unspecified site: Secondary | ICD-10-CM | POA: Diagnosis not present

## 2017-04-09 DIAGNOSIS — I2510 Atherosclerotic heart disease of native coronary artery without angina pectoris: Secondary | ICD-10-CM | POA: Diagnosis not present

## 2017-04-09 DIAGNOSIS — Y92009 Unspecified place in unspecified non-institutional (private) residence as the place of occurrence of the external cause: Secondary | ICD-10-CM | POA: Diagnosis not present

## 2017-04-09 DIAGNOSIS — Y939 Activity, unspecified: Secondary | ICD-10-CM | POA: Diagnosis not present

## 2017-04-09 DIAGNOSIS — Z950 Presence of cardiac pacemaker: Secondary | ICD-10-CM | POA: Insufficient documentation

## 2017-04-09 DIAGNOSIS — Z951 Presence of aortocoronary bypass graft: Secondary | ICD-10-CM | POA: Insufficient documentation

## 2017-04-09 DIAGNOSIS — Z7984 Long term (current) use of oral hypoglycemic drugs: Secondary | ICD-10-CM | POA: Diagnosis not present

## 2017-04-09 DIAGNOSIS — S199XXA Unspecified injury of neck, initial encounter: Secondary | ICD-10-CM | POA: Diagnosis not present

## 2017-04-09 DIAGNOSIS — M5137 Other intervertebral disc degeneration, lumbosacral region: Secondary | ICD-10-CM | POA: Diagnosis not present

## 2017-04-09 DIAGNOSIS — Y999 Unspecified external cause status: Secondary | ICD-10-CM | POA: Diagnosis not present

## 2017-04-09 DIAGNOSIS — I10 Essential (primary) hypertension: Secondary | ICD-10-CM | POA: Diagnosis not present

## 2017-04-09 DIAGNOSIS — I509 Heart failure, unspecified: Secondary | ICD-10-CM | POA: Insufficient documentation

## 2017-04-09 DIAGNOSIS — I110 Hypertensive heart disease with heart failure: Secondary | ICD-10-CM | POA: Diagnosis not present

## 2017-04-09 LAB — CBC
HEMATOCRIT: 32.7 % — AB (ref 35.0–47.0)
HEMOGLOBIN: 11.3 g/dL — AB (ref 12.0–16.0)
MCH: 30.1 pg (ref 26.0–34.0)
MCHC: 34.7 g/dL (ref 32.0–36.0)
MCV: 86.7 fL (ref 80.0–100.0)
Platelets: 243 10*3/uL (ref 150–440)
RBC: 3.77 MIL/uL — ABNORMAL LOW (ref 3.80–5.20)
RDW: 14.1 % (ref 11.5–14.5)
WBC: 6.9 10*3/uL (ref 3.6–11.0)

## 2017-04-09 LAB — BASIC METABOLIC PANEL
Anion gap: 10 (ref 5–15)
BUN: 25 mg/dL — AB (ref 6–20)
CHLORIDE: 98 mmol/L — AB (ref 101–111)
CO2: 24 mmol/L (ref 22–32)
CREATININE: 1.25 mg/dL — AB (ref 0.44–1.00)
Calcium: 9.5 mg/dL (ref 8.9–10.3)
GFR calc Af Amer: 43 mL/min — ABNORMAL LOW (ref >60)
GFR calc non Af Amer: 37 mL/min — ABNORMAL LOW (ref >60)
GLUCOSE: 206 mg/dL — AB (ref 65–99)
Potassium: 4.7 mmol/L (ref 3.5–5.1)
Sodium: 132 mmol/L — ABNORMAL LOW (ref 135–145)

## 2017-04-09 LAB — PROTIME-INR
INR: 1.5
PROTHROMBIN TIME: 18 s — AB (ref 11.4–15.2)

## 2017-04-09 LAB — APTT: aPTT: 41 seconds — ABNORMAL HIGH (ref 24–36)

## 2017-04-09 NOTE — Discharge Instructions (Signed)
You were seen in the Emergency Department (ED) today for a head injury.  Based on your evaluation, you may have sustained a concussion (or bruise) to your brain.  If you had a CT scan done, it did not show any evidence of serious injury or bleeding.   ° °Symptoms to expect from a concussion include nausea, mild to moderate headache, difficulty concentrating or sleeping, and mild lightheadedness.  These symptoms should improve over the next few days to weeks, but it may take many weeks before you feel back to normal.  Return to the emergency department or follow-up with your primary care doctor if your symptoms are not improving over this time. ° °Signs of a more serious head injury include vomiting, severe headache, excessive sleepiness or confusion, and weakness or numbness in your face, arms or legs.  Return immediately to the Emergency Department if you experience any of these more concerning symptoms.   ° ° °

## 2017-04-09 NOTE — ED Provider Notes (Signed)
[facility] Emergency Department Provider Note   ____________________________________________   First MD Initiated Contact with Patient 04/09/17 1923     (approximate)  I have reviewed the triage vital signs and the nursing notes.   HISTORY  Chief Complaint Fall    HPI Veronica Short is a 83 y.o. female   For evaluation after a fall  Patient was getting ready to come to the hospital to see her husband is also here. She walked past the refrigerator, reports she slipped and fell backwards striking the back of her head. She did not pass out. She is not expressing neck pain but reports she's had some mild discomfort in the left side of her neck for several weeks that is unchanged. No numbness tingling or weakness except for across her left face and some feeling of tingling in her left hand which is been ongoing for about a week now since she had a small stroke. No new changes.  Denies headache. Does take a blood thinner. No chest pain or trouble breathing. No recent fevers. Reports otherwise in her normal state of health and lost her footing and fell backwards   Past Medical History:  Diagnosis Date  . Arthritis   . CAD (coronary artery disease)   . CHF (congestive heart failure) (HCC)   . Diabetes mellitus without complication (HCC)   . High cholesterol   . Hypertension     Patient Active Problem List   Diagnosis Date Noted  . Speech disorder 01/31/2015  . Closed 2-part nondisplaced fracture of surgical neck of left humerus 01/07/2015  . Rotator cuff arthropathy 01/07/2015  . Left rotator cuff tear arthropathy 01/07/2015  . TIA (transient ischemic attack) 01/03/2015  . CAD (coronary artery disease) 01/03/2015  . Diabetes mellitus without complication (HCC) 01/03/2015  . Hypertension 01/03/2015  . High cholesterol 01/03/2015  . Cerebral thrombosis with cerebral infarction (HCC) 01/03/2015  . Cerebral infarction due to thrombosis of cerebral  artery (HCC) 01/03/2015  . Transient cerebral ischemia 01/03/2015  . Atrial fibrillation, unspecified   . Speech disturbance   . Osteoarthritis 03/23/2014  . Aortic valve disorder 11/20/2013  . CAD (coronary artery disease), native coronary artery 11/20/2013  . DDD (degenerative disc disease), lumbosacral 11/20/2013  . Obesity, unspecified 11/20/2013  . Osteoporosis 11/20/2013  . Type II or unspecified type diabetes mellitus without mention of complication, not stated as uncontrolled 11/20/2013  . Atrial fibrillation (HCC) 11/01/2013  . Other and unspecified hyperlipidemia 11/01/2013  . S/P CABG x 4 11/01/2013  . Pacemaker 02/22/2012  . H/O cardiac catheterization 11/10/1993  . MI (myocardial infarction) (HCC) 01/15/1991    Past Surgical History:  Procedure Laterality Date  . CHOLECYSTECTOMY    . CORONARY ARTERY BYPASS GRAFT    . PACEMAKER INSERTION      Prior to Admission medications   Medication Sig Start Date End Date Taking? Authorizing Provider  acetaminophen (TYLENOL) 500 MG tablet Take 1,000 mg by mouth every 6 (six) hours as needed for mild pain.    [provider]  albuterol (PROVENTIL HFA;VENTOLIN HFA) 108 (90 Base) MCG/ACT inhaler Inhale 2 puffs into the lungs every 6 (six) hours as needed for wheezing or shortness of breath. 10/27/15   Leona Carry, MD  apixaban (ELIQUIS) 5 MG TABS tablet Take 1 tablet (5 mg total) by mouth 2 (two) times daily. 01/04/15   Catarina Hartshorn, MD  atenolol (TENORMIN) 100 MG tablet Take 100 mg by mouth daily.  10/14/13   [provider]  atorvastatin (LIPITOR) 40 MG tablet Take 1 tablet (40 mg total) by mouth at bedtime. 01/04/15   Catarina Hartshorn, MD  CALCIUM PO Take 2 tablets by mouth 2 (two) times daily.    [provider]  Cholecalciferol (VITAMIN D PO) Take 1 tablet by mouth every morning.    [provider]  DULoxetine (CYMBALTA) 30 MG capsule Take 30 mg by mouth daily.    [provider]  fluticasone  (FLONASE) 50 MCG/ACT nasal spray Place 2 sprays into both nostrils daily.    [provider]  metFORMIN (GLUCOPHAGE-XR) 500 MG 24 hr tablet Take 500 mg by mouth daily with breakfast.  11/01/13   [provider]  nitroGLYCERIN (NITROSTAT) 0.4 MG SL tablet Place 0.4 mg under the tongue every 5 (five) minutes as needed for chest pain.    [provider]  traMADol (ULTRAM) 50 MG tablet Take 50 mg by mouth every 6 (six) hours as needed for moderate pain.    [provider]    Allergies Morphine and Penicillins  No family history on file.  Social History Social History  Substance Use Topics  . Smoking status: Never Smoker  . Smokeless tobacco: Never Used  . Alcohol use No    Review of Systems Constitutional: No fever/chills Eyes: No visual changes. ENT: No sore throat.reports he feels slightly sore across the back right upper scalp Cardiovascular: Denies chest pain. Respiratory: Denies shortness of breath. Gastrointestinal: No abdominal pain.  No nausea, no vomiting.  No diarrhea.  No constipation. Genitourinary: Negative for dysuria. Musculoskeletal: Negative for back pain. Skin: Negative for rash. Neurological: Negative for headaches, focal weakness or numbness.    ____________________________________________   PHYSICAL EXAM:  VITAL SIGNS: ED Triage Vitals  Enc Vitals Group     BP 04/09/17 1923 (!) 146/74     Pulse Rate 04/09/17 1923 72     Resp 04/09/17 1923 16     Temp 04/09/17 1923 97.7 F (36.5 C)     Temp Source 04/09/17 1923 Oral     SpO2 04/09/17 1923 97 %     Weight 04/09/17 1924 150 lb (68 kg)     Height 04/09/17 1924 5' (1.524 m)     Head Circumference --      Peak Flow --      Pain Score 04/09/17 1923 5     Pain Loc --      Pain Edu? --      Excl. in GC? --     Constitutional: Alert and oriented. Well appearing and in no acute distress. Eyes: Conjunctivae are normal. Head: Atraumaticexcept for a small hematoma over  the right posterior scalp bleeding. Nose: No congestion/rhinnorhea. Mouth/Throat: Mucous membranes are moist. Neck: No stridor.  no midline cervical tenderness. Full range of motion of neck without pain. Cardiovascular: Normal rate, regular rhythm. Grossly normal heart sounds.  Good peripheral circulation. Respiratory: Normal respiratory effort.  No retractions. Lungs CTAB. Gastrointestinal: Soft and nontender. No distention. Musculoskeletal: No lower extremity tenderness nor edema. Neurologic:  Normal speech and language. No gross focal neurologic deficits are appreciated. patient does have mild loss of sensation across the left side of the face and slight across left hand which she reports is been present for a week which she was discharged with after a possible small stroke or TIA. No new weakness. No notable motor weakness. No facial droop. Skin:  Skin is warm, dry and intact. No rash noted. Psychiatric: Mood and affect are  normal. Speech and behavior are normal.  ____________________________________________   LABS (all labs ordered are listed, but only abnormal results are displayed)  Labs Reviewed  CBC - Abnormal; Notable for the following:       Result Value   RBC 3.77 (*)    Hemoglobin 11.3 (*)    HCT 32.7 (*)    All other components within normal limits  BASIC METABOLIC PANEL - Abnormal; Notable for the following:    Sodium 132 (*)    Chloride 98 (*)    Glucose, Bld 206 (*)    BUN 25 (*)    Creatinine, Ser 1.25 (*)    GFR calc non Af Amer 37 (*)    GFR calc Af Amer 43 (*)    All other components within normal limits  APTT  PROTIME-INR  CBG MONITORING, ED   ____________________________________________  EKG  reviewed and interpreted by me at 1930 Heart rate 70 Care is 150 QTC 500 V pacing, slight peaking of the T waves is notable and we'll check potassium low risk consistent with the paced rhythm. Of note the patient complains of no chest pain or trouble  breathing. ____________________________________________  RADIOLOGY  Ct Head Wo Contrast  Result Date: 04/09/2017 CLINICAL DATA:  Fall EXAM: CT HEAD WITHOUT CONTRAST CT CERVICAL SPINE WITHOUT CONTRAST TECHNIQUE: Multidetector CT imaging of the head and cervical spine was performed following the standard protocol without intravenous contrast. Multiplanar CT image reconstructions of the cervical spine were also generated. COMPARISON:  03/26/2017, 01/02/2015 FINDINGS: CT HEAD FINDINGS Brain: No acute territorial infarction, hemorrhage or intracranial mass is visualized. Small vessel ischemic changes of the white matter. Old lacunar infarcts within the bilateral basal ganglia. Mild atrophy. Stable ventricle size Vascular: No hyperdense vessels. Carotid artery calcification and vertebral artery calcification Skull: No depressed skull fracture Sinuses/Orbits: Opacified right sphenoid sinus with bony remottling consistent with chronic sinusitis. Opacified ethmoid air cells. No acute orbital abnormality. Bilateral lens extraction Other: None CT CERVICAL SPINE FINDINGS Alignment: Grade 1 anterolisthesis of C3 on C4 and C4 on C5, likely degenerative. Trace anterolisthesis of C7 on T1. Mild reversal of cervical lordosis. Facet alignment is within normal limits. Skull base and vertebrae: Craniovertebral junction is intact. No fracture is seen. Soft tissues and spinal canal: No prevertebral fluid or swelling. No visible canal hematoma. Disc levels: Moderate degenerative changes at C4-C5 and C5-C6, advanced degenerative changes at C6-C7. Multiple level bilateral facet arthropathy. Upper chest: Lung apices are clear. Aortic atherosclerosis. No thyroid mass Other: None IMPRESSION: 1. No CT evidence for acute intracranial abnormality. Atrophy and small vessel ischemic changes of the white matter 2. Trace anterolisthesis of C3 on C4, C4 on C5 and C7 on T1, likely on the basis of degenerative change. No acute fracture is seen.  Electronically Signed   By: Jasmine Pang M.D.   On: 04/09/2017 20:07   Ct Cervical Spine Wo Contrast  Result Date: 04/09/2017 CLINICAL DATA:  Fall EXAM: CT HEAD WITHOUT CONTRAST CT CERVICAL SPINE WITHOUT CONTRAST TECHNIQUE: Multidetector CT imaging of the head and cervical spine was performed following the standard protocol without intravenous contrast. Multiplanar CT image reconstructions of the cervical spine were also generated. COMPARISON:  03/26/2017, 01/02/2015 FINDINGS: CT HEAD FINDINGS Brain: No acute territorial infarction, hemorrhage or intracranial mass is visualized. Small vessel ischemic changes of the white matter. Old lacunar infarcts within the bilateral basal ganglia. Mild atrophy. Stable ventricle size Vascular: No hyperdense vessels. Carotid artery calcification and vertebral artery calcification Skull: No depressed skull  fracture Sinuses/Orbits: Opacified right sphenoid sinus with bony remottling consistent with chronic sinusitis. Opacified ethmoid air cells. No acute orbital abnormality. Bilateral lens extraction Other: None CT CERVICAL SPINE FINDINGS Alignment: Grade 1 anterolisthesis of C3 on C4 and C4 on C5, likely degenerative. Trace anterolisthesis of C7 on T1. Mild reversal of cervical lordosis. Facet alignment is within normal limits. Skull base and vertebrae: Craniovertebral junction is intact. No fracture is seen. Soft tissues and spinal canal: No prevertebral fluid or swelling. No visible canal hematoma. Disc levels: Moderate degenerative changes at C4-C5 and C5-C6, advanced degenerative changes at C6-C7. Multiple level bilateral facet arthropathy. Upper chest: Lung apices are clear. Aortic atherosclerosis. No thyroid mass Other: None IMPRESSION: 1. No CT evidence for acute intracranial abnormality. Atrophy and small vessel ischemic changes of the white matter 2. Trace anterolisthesis of C3 on C4, C4 on C5 and C7 on T1, likely on the basis of degenerative change. No acute fracture  is seen. Electronically Signed   By: Jasmine Pang M.D.   On: 04/09/2017 20:07     head CT no acute. CT cervical spine some anterolisthesis, likely degenerative in nature and the patient reports she's had discomfort in the neck preceding her injury today. No midline tenderness or pain with motion. Have clear C-spine clinically. ____________________________________________   PROCEDURES  Procedure(s) performed: None  Procedures  Critical Care performed: No  ____________________________________________   INITIAL IMPRESSION / ASSESSMENT AND PLAN / ED COURSE  Pertinent labs & imaging results that were available during my care of the patient were reviewed by me and considered in my medical decision making (see chart for details).  head injury. Fall, appears to be mechanical in nature with patient reporting losing her footing. She is alert and well-oriented  consciousness. Does demonstrate a hematoma along the right back scalp and based on her history of being on anticoagulant report a head CT to exclude trauma as well as evaluate CT of the C-spine though I believe she is low risk for cervical injury and she reports she did have some mild neck discomfort for weeks which is unchanged.  no associated systemic symptoms. Based on history of hyponatremia, we will however check a set labs evaluate for sodium, low hemoglobin potassium etc.    ----------------------------------------- 8:29 PM on 04/09/2017 -----------------------------------------  Resting comfortably in no distress. CT does not demonstrate any acute injury. Return precautions and treatment recommendations and follow-up discussed with the patient who is agreeable with the plan.  ____________________________________________   FINAL CLINICAL IMPRESSION(S) / ED DIAGNOSES  Final diagnoses:  Fall, initial encounter  Injury of head, initial encounter  Scalp hematoma, initial encounter      NEW MEDICATIONS STARTED DURING  THIS VISIT:  New Prescriptions   No medications on file     Note:  This document was prepared using Dragon voice recognition software and may include unintentional dictation errors.     Sharyn Creamer, MD 04/09/17 2029

## 2017-04-09 NOTE — ED Triage Notes (Signed)
Pt states she fell backwards after eating this pm. Pt complains of low back pain, bilateral hip pain. Cms intact in all extremities, pt denies loc.

## 2017-04-14 DIAGNOSIS — E119 Type 2 diabetes mellitus without complications: Secondary | ICD-10-CM | POA: Diagnosis not present

## 2017-04-14 DIAGNOSIS — I509 Heart failure, unspecified: Secondary | ICD-10-CM | POA: Diagnosis not present

## 2017-04-14 DIAGNOSIS — M1990 Unspecified osteoarthritis, unspecified site: Secondary | ICD-10-CM | POA: Diagnosis not present

## 2017-04-14 DIAGNOSIS — I110 Hypertensive heart disease with heart failure: Secondary | ICD-10-CM | POA: Diagnosis not present

## 2017-04-14 DIAGNOSIS — Z8673 Personal history of transient ischemic attack (TIA), and cerebral infarction without residual deficits: Secondary | ICD-10-CM | POA: Diagnosis not present

## 2017-04-14 DIAGNOSIS — M5137 Other intervertebral disc degeneration, lumbosacral region: Secondary | ICD-10-CM | POA: Diagnosis not present

## 2017-04-15 DIAGNOSIS — M1990 Unspecified osteoarthritis, unspecified site: Secondary | ICD-10-CM | POA: Diagnosis not present

## 2017-04-15 DIAGNOSIS — E119 Type 2 diabetes mellitus without complications: Secondary | ICD-10-CM | POA: Diagnosis not present

## 2017-04-15 DIAGNOSIS — M5137 Other intervertebral disc degeneration, lumbosacral region: Secondary | ICD-10-CM | POA: Diagnosis not present

## 2017-04-15 DIAGNOSIS — I509 Heart failure, unspecified: Secondary | ICD-10-CM | POA: Diagnosis not present

## 2017-04-15 DIAGNOSIS — Z8673 Personal history of transient ischemic attack (TIA), and cerebral infarction without residual deficits: Secondary | ICD-10-CM | POA: Diagnosis not present

## 2017-04-15 DIAGNOSIS — I110 Hypertensive heart disease with heart failure: Secondary | ICD-10-CM | POA: Diagnosis not present

## 2017-04-19 DIAGNOSIS — M5137 Other intervertebral disc degeneration, lumbosacral region: Secondary | ICD-10-CM | POA: Diagnosis not present

## 2017-04-19 DIAGNOSIS — Z8673 Personal history of transient ischemic attack (TIA), and cerebral infarction without residual deficits: Secondary | ICD-10-CM | POA: Diagnosis not present

## 2017-04-19 DIAGNOSIS — E119 Type 2 diabetes mellitus without complications: Secondary | ICD-10-CM | POA: Diagnosis not present

## 2017-04-19 DIAGNOSIS — M1990 Unspecified osteoarthritis, unspecified site: Secondary | ICD-10-CM | POA: Diagnosis not present

## 2017-04-19 DIAGNOSIS — I110 Hypertensive heart disease with heart failure: Secondary | ICD-10-CM | POA: Diagnosis not present

## 2017-04-19 DIAGNOSIS — I509 Heart failure, unspecified: Secondary | ICD-10-CM | POA: Diagnosis not present

## 2017-04-22 ENCOUNTER — Emergency Department
Admission: EM | Admit: 2017-04-22 | Discharge: 2017-04-22 | Disposition: A | Discharge status: Home / Self Care | Payer: Medicare HMO | Attending: Emergency Medicine | Admitting: Emergency Medicine

## 2017-04-22 ENCOUNTER — Emergency Department: Payer: Medicare HMO

## 2017-04-22 ENCOUNTER — Encounter: Payer: Self-pay | Admitting: *Deleted

## 2017-04-22 DIAGNOSIS — Z8673 Personal history of transient ischemic attack (TIA), and cerebral infarction without residual deficits: Secondary | ICD-10-CM | POA: Diagnosis not present

## 2017-04-22 DIAGNOSIS — Y92000 Kitchen of unspecified non-institutional (private) residence as  the place of occurrence of the external cause: Secondary | ICD-10-CM | POA: Insufficient documentation

## 2017-04-22 DIAGNOSIS — I110 Hypertensive heart disease with heart failure: Secondary | ICD-10-CM | POA: Insufficient documentation

## 2017-04-22 DIAGNOSIS — Z7984 Long term (current) use of oral hypoglycemic drugs: Secondary | ICD-10-CM | POA: Diagnosis not present

## 2017-04-22 DIAGNOSIS — E119 Type 2 diabetes mellitus without complications: Secondary | ICD-10-CM | POA: Insufficient documentation

## 2017-04-22 DIAGNOSIS — Z79899 Other long term (current) drug therapy: Secondary | ICD-10-CM | POA: Diagnosis not present

## 2017-04-22 DIAGNOSIS — M25552 Pain in left hip: Secondary | ICD-10-CM | POA: Diagnosis not present

## 2017-04-22 DIAGNOSIS — S0990XA Unspecified injury of head, initial encounter: Secondary | ICD-10-CM | POA: Diagnosis present

## 2017-04-22 DIAGNOSIS — I252 Old myocardial infarction: Secondary | ICD-10-CM | POA: Insufficient documentation

## 2017-04-22 DIAGNOSIS — Z951 Presence of aortocoronary bypass graft: Secondary | ICD-10-CM | POA: Diagnosis not present

## 2017-04-22 DIAGNOSIS — S0003XA Contusion of scalp, initial encounter: Secondary | ICD-10-CM

## 2017-04-22 DIAGNOSIS — W010XXA Fall on same level from slipping, tripping and stumbling without subsequent striking against object, initial encounter: Secondary | ICD-10-CM | POA: Diagnosis not present

## 2017-04-22 DIAGNOSIS — Z7901 Long term (current) use of anticoagulants: Secondary | ICD-10-CM | POA: Diagnosis not present

## 2017-04-22 DIAGNOSIS — W19XXXA Unspecified fall, initial encounter: Secondary | ICD-10-CM

## 2017-04-22 DIAGNOSIS — Y9301 Activity, walking, marching and hiking: Secondary | ICD-10-CM | POA: Diagnosis not present

## 2017-04-22 DIAGNOSIS — I509 Heart failure, unspecified: Secondary | ICD-10-CM | POA: Diagnosis not present

## 2017-04-22 DIAGNOSIS — S79912A Unspecified injury of left hip, initial encounter: Secondary | ICD-10-CM | POA: Diagnosis not present

## 2017-04-22 DIAGNOSIS — I10 Essential (primary) hypertension: Secondary | ICD-10-CM | POA: Diagnosis not present

## 2017-04-22 DIAGNOSIS — Y999 Unspecified external cause status: Secondary | ICD-10-CM | POA: Diagnosis not present

## 2017-04-22 DIAGNOSIS — S199XXA Unspecified injury of neck, initial encounter: Secondary | ICD-10-CM | POA: Diagnosis not present

## 2017-04-22 DIAGNOSIS — I2510 Atherosclerotic heart disease of native coronary artery without angina pectoris: Secondary | ICD-10-CM | POA: Insufficient documentation

## 2017-04-22 LAB — CBC
HCT: 32.8 % — ABNORMAL LOW (ref 35.0–47.0)
HEMOGLOBIN: 11.3 g/dL — AB (ref 12.0–16.0)
MCH: 29.6 pg (ref 26.0–34.0)
MCHC: 34.5 g/dL (ref 32.0–36.0)
MCV: 86 fL (ref 80.0–100.0)
PLATELETS: 294 10*3/uL (ref 150–440)
RBC: 3.81 MIL/uL (ref 3.80–5.20)
RDW: 14 % (ref 11.5–14.5)
WBC: 6.7 10*3/uL (ref 3.6–11.0)

## 2017-04-22 LAB — BASIC METABOLIC PANEL
Anion gap: 12 (ref 5–15)
BUN: 25 mg/dL — AB (ref 6–20)
CO2: 27 mmol/L (ref 22–32)
CREATININE: 1.09 mg/dL — AB (ref 0.44–1.00)
Calcium: 9.5 mg/dL (ref 8.9–10.3)
Chloride: 96 mmol/L — ABNORMAL LOW (ref 101–111)
GFR, EST AFRICAN AMERICAN: 50 mL/min — AB (ref >60)
GFR, EST NON AFRICAN AMERICAN: 43 mL/min — AB (ref >60)
Glucose, Bld: 177 mg/dL — ABNORMAL HIGH (ref 65–99)
Potassium: 4.5 mmol/L (ref 3.5–5.1)
SODIUM: 135 mmol/L (ref 135–145)

## 2017-04-22 LAB — URINALYSIS, COMPLETE (UACMP) WITH MICROSCOPIC
Bacteria, UA: NONE SEEN
Bilirubin Urine: NEGATIVE
Glucose, UA: NEGATIVE mg/dL
HGB URINE DIPSTICK: NEGATIVE
Ketones, ur: NEGATIVE mg/dL
Leukocytes, UA: NEGATIVE
NITRITE: NEGATIVE
PH: 6 (ref 5.0–8.0)
Protein, ur: NEGATIVE mg/dL
RBC / HPF: NONE SEEN RBC/hpf (ref 0–5)
Specific Gravity, Urine: 1.011 (ref 1.005–1.030)

## 2017-04-22 MED ORDER — ACETAMINOPHEN 500 MG PO TABS
1000.0000 mg | ORAL_TABLET | Freq: Once | ORAL | Status: AC
  Administered 2017-04-22: 1000 mg via ORAL
  Filled 2017-04-22: qty 2

## 2017-04-22 MED ORDER — ACETAMINOPHEN 500 MG PO TABS
ORAL_TABLET | ORAL | Status: AC
  Filled 2017-04-22: qty 2

## 2017-04-22 NOTE — ED Triage Notes (Signed)
PT to ED from  Home after reporting a fall this afternoon. PT reports she was standing and the next second she was on the ground and had hit head. Pt is currently on Eliquis. No bruising noted at this time. No neuro symptoms noted or confusion. PT also reports pain in left hip since the fall.   Pt has hx of falls and "being unsteady on feet." PT reports hx of vertigo and dizziness. No reports of syncope. Left eye has noted ruptured blood vessel that family reports is new with fall. No changes in vision reported.

## 2017-04-22 NOTE — Discharge Instructions (Signed)

## 2017-04-22 NOTE — ED Notes (Signed)
Patient e-signature unavailable.  Patient family signed hard copy and hard copy sent to medical records to be scanned.

## 2017-04-22 NOTE — ED Provider Notes (Signed)
[facility] Emergency Department Provider Note  ____________________________________________  Time seen: Approximately 4:30 PM  I have reviewed the triage vital signs and the nursing notes.   HISTORY  Chief Complaint Fall   HPI Veronica Short Veronica Short is a 83 y.o. female with a history of CAD, atrial fibrillation on Eliquis, CHF, hypertension who presents for evaluation of a fall. Patient reports that she was trying to put her plate of food on the counterwhen she lost her balance and fell. Patient has a history of frequent falls. Has a walker and was using it today when she fell. she denies dizziness, palpitations, vertigo, chest pain, headache, shortness of breath, abdominal pain, nausea, vomiting, diarrhea, dysuria both preceding or after her fall. The fall was mechanical in nature and witnessed by patient's daughter. She reports that she hit the left side of her head on the ground. No LOC. She is complaining of pain on her left hip and a headache since the fall. Both pains are mild and nonradiating. She was able to stand up after the fall.  Past Medical History:  Diagnosis Date  . Arthritis   . CAD (coronary artery disease)   . CHF (congestive heart failure) (HCC)   . Diabetes mellitus without complication (HCC)   . High cholesterol   . Hypertension     Patient Active Problem List   Diagnosis Date Noted  . Speech disorder 01/31/2015  . Closed 2-part nondisplaced fracture of surgical neck of left humerus 01/07/2015  . Rotator cuff arthropathy 01/07/2015  . Left rotator cuff tear arthropathy 01/07/2015  . TIA (transient ischemic attack) 01/03/2015  . CAD (coronary artery disease) 01/03/2015  . Diabetes mellitus without complication (HCC) 01/03/2015  . Hypertension 01/03/2015  . High cholesterol 01/03/2015  . Cerebral thrombosis with cerebral infarction (HCC) 01/03/2015  . Cerebral infarction due to thrombosis of cerebral artery (HCC) 01/03/2015  .  Transient cerebral ischemia 01/03/2015  . Atrial fibrillation, unspecified   . Speech disturbance   . Osteoarthritis 03/23/2014  . Aortic valve disorder 11/20/2013  . CAD (coronary artery disease), native coronary artery 11/20/2013  . DDD (degenerative disc disease), lumbosacral 11/20/2013  . Obesity, unspecified 11/20/2013  . Osteoporosis 11/20/2013  . Type II or unspecified type diabetes mellitus without mention of complication, not stated as uncontrolled 11/20/2013  . Atrial fibrillation (HCC) 11/01/2013  . Other and unspecified hyperlipidemia 11/01/2013  . S/P CABG x 4 11/01/2013  . Pacemaker 02/22/2012  . H/O cardiac catheterization 11/10/1993  . MI (myocardial infarction) (HCC) 01/15/1991    Past Surgical History:  Procedure Laterality Date  . CHOLECYSTECTOMY    . CORONARY ARTERY BYPASS GRAFT    . PACEMAKER INSERTION      Prior to Admission medications   Medication Sig Start Date End Date Taking? Authorizing Provider  acetaminophen (TYLENOL) 500 MG tablet Take 1,000 mg by mouth every 6 (six) hours as needed for mild pain.    [provider]  albuterol (PROVENTIL HFA;VENTOLIN HFA) 108 (90 Base) MCG/ACT inhaler Inhale 2 puffs into the lungs every 6 (six) hours as needed for wheezing or shortness of breath. 10/27/15   Leona Carry, MD  apixaban (ELIQUIS) 5 MG TABS tablet Take 1 tablet (5 mg total) by mouth 2 (two) times daily. 01/04/15   Catarina Hartshorn, MD  atenolol (TENORMIN) 100 MG tablet Take 100 mg by mouth daily.  10/14/13   [provider]  atorvastatin (LIPITOR) 40 MG tablet Take 1 tablet (40 mg total) by mouth at  bedtime. 01/04/15   Catarina Hartshorn, MD  CALCIUM PO Take 2 tablets by mouth 2 (two) times daily.    [provider]  Cholecalciferol (VITAMIN D PO) Take 1 tablet by mouth every morning.    [provider]  DULoxetine (CYMBALTA) 30 MG capsule Take 30 mg by mouth daily.    [provider]  fluticasone (FLONASE) 50 MCG/ACT nasal  spray Place 2 sprays into both nostrils daily.    [provider]  metFORMIN (GLUCOPHAGE-XR) 500 MG 24 hr tablet Take 500 mg by mouth daily with breakfast.  11/01/13   [provider]  nitroGLYCERIN (NITROSTAT) 0.4 MG SL tablet Place 0.4 mg under the tongue every 5 (five) minutes as needed for chest pain.    [provider]  traMADol (ULTRAM) 50 MG tablet Take 50 mg by mouth every 6 (six) hours as needed for moderate pain.    [provider]    Allergies Morphine and Penicillins  History reviewed. No pertinent family history.  Social History Social History  Substance Use Topics  . Smoking status: Never Smoker  . Smokeless tobacco: Never Used  . Alcohol use No    Review of Systems Constitutional: Negative for fever. Eyes: Negative for visual changes. ENT: Negative for facial injury or neck injury Cardiovascular: Negative for chest injury. Respiratory: Negative for shortness of breath. Negative for chest wall injury. Gastrointestinal: Negative for abdominal pain or injury. Genitourinary: Negative for dysuria. Musculoskeletal: Negative for back injury, negative for arm or leg pain. + L hip pain Skin: Negative for laceration/abrasions. Neurological: + head injury.   ____________________________________________   PHYSICAL EXAM:  VITAL SIGNS: Vitals:   04/22/17 1550 04/22/17 1710  BP: 130/60 (!) 133/56  Pulse: 60 63  Resp: 16 18  Temp: 98 F (36.7 C) 98.4 F (36.9 C)  SpO2: 100% 100%     Constitutional: Alert and oriented. No acute distress. Does not appear intoxicated. HEENT Head: Normocephalic. left scalp hematoma Face: No facial bony tenderness. Stable midface Ears: No hemotympanum bilaterally. No Battle sign Eyes: No eye injury. PERRL. No raccoon eyes Nose: Nontender. No epistaxis. No rhinorrhea Mouth/Throat: Mucous membranes are moist. No oropharyngeal blood. No dental injury. Airway patent without stridor. Normal  voice. Neck: no C-collar in place. No midline c-spine tenderness.  Cardiovascular: Normal rate, regular rhythm. Normal and symmetric distal pulses are present in all extremities. Pulmonary/Chest: Chest wall is stable and nontender to palpation/compression. Normal respiratory effort. Breath sounds are normal. No crepitus.  Abdominal: Soft, nontender, non distended. Musculoskeletal: Nontender with normal full range of motion in all extremities. No deformities. No thoracic or lumbar midline spinal tenderness. Pelvis is stable. Skin: Skin is warm, dry and intact. No abrasions or contutions. Psychiatric: Speech and behavior are appropriate. Neurological: Normal speech and language. Moves all extremities to command. No gross focal neurologic deficits are appreciated.  Glascow Coma Score: 4 - Opens eyes on own 6 - Follows simple motor commands 5 - Alert and oriented GCS: 15   ____________________________________________   LABS (all labs ordered are listed, but only abnormal results are displayed)  Labs Reviewed  BASIC METABOLIC PANEL - Abnormal; Notable for the following:       Result Value   Chloride 96 (*)    Glucose, Bld 177 (*)    BUN 25 (*)    Creatinine, Ser 1.09 (*)    GFR calc non Af Amer 43 (*)    GFR calc Af Amer 50 (*)    All other components  within normal limits  CBC - Abnormal; Notable for the following:    Hemoglobin 11.3 (*)    HCT 32.8 (*)    All other components within normal limits  URINALYSIS, COMPLETE (UACMP) WITH MICROSCOPIC - Abnormal; Notable for the following:    Color, Urine YELLOW (*)    APPearance CLEAR (*)    Squamous Epithelial / LPF 0-5 (*)    All other components within normal limits  CBG MONITORING, ED   ____________________________________________  EKG  ED ECG REPORT I, Nita Sickle, the attending physician, personally viewed and interpreted this ECG.  Atrial fibrillation with frequent ventricular paced complexes, left bundle branch  block, rate of 66, normal QTC, normal axis, no ST elevations or depressions. Paced rhythm new when compared to prior from 03/2017 ____________________________________________  RADIOLOGY  Head and cspine ZO:[id number]  XR pelvis and L hip:  negativ ____________________________________________   PROCEDURES  Procedure(s) performed: None Procedures Critical Care performed:  None ____________________________________________   INITIAL IMPRESSION / ASSESSMENT AND PLAN / ED COURSE  83 y.o. female with a history of CAD, atrial fibrillation on Eliquis, CHF, hypertension who presents for evaluation of a fall with head trauma. GCS 15, no signs or symptoms of basilar skull fracture. No obvious injuries other than a left scalp hematoma on exam. Since patient is on blood thinners, CT head and C-spine has been ordered. She is also complaining of mild left hip pain although has full range of motion of that hip. X-ray has been ordered to rule out a fracture. Falls seems to be mechanical in nature, witnessed by daughter. EKG with no ischemic changes. Labs with no evidence of anemia, dehydration, or electrolyte abnormalities.      _________________________ 5:05 PM on 04/22/2017 ----------------------------------------- Imaging with no acute findings. Patient remains well appearing. discussed with patient and her daughter signs of delayed head bleed on blood thinners and recommended return if these signs develop. Otherwise close follow-up with primary care doctor. Endorsed importance of using her walker at all times to avoid falls.    As part of my medical decision making, I reviewed the following data within the electronic MEDICAL RECORD NUMBER History obtained from family, Nursing notes reviewed and incorporated, Labs reviewed , EKG interpreted , Old EKG reviewed, Old chart reviewed, Radiograph reviewed , Notes from prior ED visits and [place] Controlled Substance Database    Pertinent labs & imaging results  that were available during my care of the patient were reviewed by me and considered in my medical decision making (see chart for details).    ____________________________________________   FINAL CLINICAL IMPRESSION(S) / ED DIAGNOSES  Final diagnoses:  Fall, initial encounter  Hematoma of scalp, initial encounter  Left hip pain      NEW MEDICATIONS STARTED DURING THIS VISIT:  New Prescriptions   No medications on file     Note:  This document was prepared using Dragon voice recognition software and may include unintentional dictation errors.    Nita Sickle, MD 04/22/17 [id number]

## 2017-04-27 DIAGNOSIS — I679 Cerebrovascular disease, unspecified: Secondary | ICD-10-CM | POA: Diagnosis not present

## 2017-04-27 DIAGNOSIS — R296 Repeated falls: Secondary | ICD-10-CM | POA: Diagnosis not present

## 2017-04-27 DIAGNOSIS — R2681 Unsteadiness on feet: Secondary | ICD-10-CM | POA: Diagnosis not present

## 2017-04-27 DIAGNOSIS — I4891 Unspecified atrial fibrillation: Secondary | ICD-10-CM | POA: Diagnosis not present

## 2017-05-13 DIAGNOSIS — R600 Localized edema: Secondary | ICD-10-CM | POA: Diagnosis not present

## 2017-05-13 DIAGNOSIS — R2681 Unsteadiness on feet: Secondary | ICD-10-CM | POA: Diagnosis not present

## 2017-05-13 DIAGNOSIS — I10 Essential (primary) hypertension: Secondary | ICD-10-CM | POA: Diagnosis not present

## 2017-05-31 DIAGNOSIS — R0982 Postnasal drip: Secondary | ICD-10-CM | POA: Diagnosis not present

## 2017-05-31 DIAGNOSIS — K219 Gastro-esophageal reflux disease without esophagitis: Secondary | ICD-10-CM | POA: Diagnosis not present

## 2017-05-31 DIAGNOSIS — R42 Dizziness and giddiness: Secondary | ICD-10-CM | POA: Diagnosis not present

## 2017-06-14 ENCOUNTER — Ambulatory Visit (INDEPENDENT_AMBULATORY_CARE_PROVIDER_SITE_OTHER): Payer: Medicare HMO | Admitting: Podiatry

## 2017-06-14 ENCOUNTER — Encounter: Payer: Self-pay | Admitting: Podiatry

## 2017-06-14 DIAGNOSIS — E0843 Diabetes mellitus due to underlying condition with diabetic autonomic (poly)neuropathy: Secondary | ICD-10-CM

## 2017-06-14 DIAGNOSIS — B351 Tinea unguium: Secondary | ICD-10-CM

## 2017-06-14 DIAGNOSIS — M79609 Pain in unspecified limb: Secondary | ICD-10-CM

## 2017-06-14 NOTE — Progress Notes (Signed)
Complaint:  Visit Type: Patient returns to my office for continued preventative foot care services. Complaint: Patient states" my nails have grown long and thick and become painful to walk and wear shoes" Patient has been diagnosed with DM with neuropathy.. The patient presents for preventative foot care services. No changes to ROS  Podiatric Exam: Vascular: dorsalis pedis and posterior tibial pulses are palpable bilateral. Capillary return is immediate. Temperature gradient is WNL. Skin turgor WNL  Sensorium: Normal Semmes Weinstein monofilament test. Normal tactile sensation bilaterally. Nail Exam: Pt has thick disfigured discolored nails with subungual debris noted bilateral entire nail hallux through fifth toenails Ulcer Exam: There is no evidence of ulcer or pre-ulcerative changes or infection. Orthopedic Exam: Muscle tone and strength are WNL. No limitations in general ROM. No crepitus or effusions noted. Foot type and digits show no abnormalities. PTTD  B/L.  HAV  B/l with hammer toe 2-4  B/L Skin: No Porokeratosis. No infection or ulcers  Diagnosis:  Onychomycosis, , Pain in right toe, pain in left toes  Treatment & Plan Procedures and Treatment: Consent by patient was obtained for treatment procedures. The patient understood the discussion of treatment and procedures well. All questions were answered thoroughly reviewed. Debridement of mycotic and hypertrophic toenails, 1 through 5 bilateral and clearing of subungual debris. No ulceration, no infection noted.  Return Visit-Office Procedure: Patient instructed to return to the office for a follow up visit 3 months for continued evaluation and treatment.    Helane GuntherGregory Suezette Lafave DPM

## 2017-06-29 DIAGNOSIS — H903 Sensorineural hearing loss, bilateral: Secondary | ICD-10-CM | POA: Diagnosis not present

## 2017-06-29 DIAGNOSIS — R682 Dry mouth, unspecified: Secondary | ICD-10-CM | POA: Diagnosis not present

## 2017-06-29 DIAGNOSIS — H6122 Impacted cerumen, left ear: Secondary | ICD-10-CM | POA: Diagnosis not present

## 2017-06-29 DIAGNOSIS — R0982 Postnasal drip: Secondary | ICD-10-CM | POA: Diagnosis not present

## 2017-06-29 DIAGNOSIS — R42 Dizziness and giddiness: Secondary | ICD-10-CM | POA: Diagnosis not present

## 2017-07-29 DIAGNOSIS — N183 Chronic kidney disease, stage 3 (moderate): Secondary | ICD-10-CM | POA: Diagnosis not present

## 2017-07-29 DIAGNOSIS — Z0000 Encounter for general adult medical examination without abnormal findings: Secondary | ICD-10-CM | POA: Diagnosis not present

## 2017-07-29 DIAGNOSIS — E1122 Type 2 diabetes mellitus with diabetic chronic kidney disease: Secondary | ICD-10-CM | POA: Diagnosis not present

## 2017-07-29 DIAGNOSIS — E7800 Pure hypercholesterolemia, unspecified: Secondary | ICD-10-CM | POA: Diagnosis not present

## 2017-07-29 DIAGNOSIS — F3341 Major depressive disorder, recurrent, in partial remission: Secondary | ICD-10-CM | POA: Diagnosis not present

## 2017-07-29 DIAGNOSIS — Z79899 Other long term (current) drug therapy: Secondary | ICD-10-CM | POA: Diagnosis not present

## 2017-07-30 DIAGNOSIS — E119 Type 2 diabetes mellitus without complications: Secondary | ICD-10-CM | POA: Diagnosis not present

## 2017-07-30 DIAGNOSIS — H16223 Keratoconjunctivitis sicca, not specified as Sjogren's, bilateral: Secondary | ICD-10-CM | POA: Diagnosis not present

## 2017-08-18 DIAGNOSIS — X32XXXA Exposure to sunlight, initial encounter: Secondary | ICD-10-CM | POA: Diagnosis not present

## 2017-08-18 DIAGNOSIS — L820 Inflamed seborrheic keratosis: Secondary | ICD-10-CM | POA: Diagnosis not present

## 2017-08-18 DIAGNOSIS — L710 Perioral dermatitis: Secondary | ICD-10-CM | POA: Diagnosis not present

## 2017-08-18 DIAGNOSIS — L570 Actinic keratosis: Secondary | ICD-10-CM | POA: Diagnosis not present

## 2017-09-13 ENCOUNTER — Encounter: Payer: Self-pay | Admitting: Podiatry

## 2017-09-13 ENCOUNTER — Ambulatory Visit (INDEPENDENT_AMBULATORY_CARE_PROVIDER_SITE_OTHER): Payer: Medicare HMO | Admitting: Podiatry

## 2017-09-13 DIAGNOSIS — M79609 Pain in unspecified limb: Secondary | ICD-10-CM | POA: Diagnosis not present

## 2017-09-13 DIAGNOSIS — B351 Tinea unguium: Secondary | ICD-10-CM | POA: Diagnosis not present

## 2017-09-13 DIAGNOSIS — D689 Coagulation defect, unspecified: Secondary | ICD-10-CM | POA: Diagnosis not present

## 2017-09-13 DIAGNOSIS — E0843 Diabetes mellitus due to underlying condition with diabetic autonomic (poly)neuropathy: Secondary | ICD-10-CM

## 2017-09-13 NOTE — Progress Notes (Signed)
Complaint:  Visit Type: Patient returns to my office for continued preventative foot care services. Complaint: Patient states" my nails have grown long and thick and become painful to walk and wear shoes" Patient has been diagnosed with DM with neuropathy.. The patient presents for preventative foot care services. No changes to ROS.  Patient is on eliquiss.  Podiatric Exam: Vascular: dorsalis pedis and posterior tibial pulses are palpable bilateral. Capillary return is immediate. Temperature gradient is WNL. Skin turgor WNL  Sensorium: Normal Semmes Weinstein monofilament test. Normal tactile sensation bilaterally. Nail Exam: Pt has thick disfigured discolored nails with subungual debris noted bilateral entire nail hallux through fifth toenails Ulcer Exam: There is no evidence of ulcer or pre-ulcerative changes or infection. Orthopedic Exam: Muscle tone and strength are WNL. No limitations in general ROM. No crepitus or effusions noted. Foot type and digits show no abnormalities. PTTD  B/L.  HAV  B/l with hammer toe 2-4  B/L Skin: No Porokeratosis. No infection or ulcers  Diagnosis:  Onychomycosis, , Pain in right toe, pain in left toes  Treatment & Plan Procedures and Treatment: Consent by patient was obtained for treatment procedures. The patient understood the discussion of treatment and procedures well. All questions were answered thoroughly reviewed. Debridement of mycotic and hypertrophic toenails, 1 through 5 bilateral and clearing of subungual debris. No ulceration, no infection noted.  Return Visit-Office Procedure: Patient instructed to return to the office for a follow up visit 3 months for continued evaluation and treatment.    Helane GuntherGregory Kwanza Cancelliere DPM

## 2017-09-23 DIAGNOSIS — I4891 Unspecified atrial fibrillation: Secondary | ICD-10-CM | POA: Diagnosis not present

## 2017-10-06 ENCOUNTER — Emergency Department: Payer: Medicare Other

## 2017-10-06 ENCOUNTER — Inpatient Hospital Stay
Admission: EM | Admit: 2017-10-06 | Discharge: 2017-10-07 | DRG: 065 | Disposition: A | Discharge status: Home Health | Payer: Medicare Other | Attending: Internal Medicine | Admitting: Internal Medicine

## 2017-10-06 ENCOUNTER — Other Ambulatory Visit: Payer: Self-pay

## 2017-10-06 ENCOUNTER — Inpatient Hospital Stay: Payer: Medicare Other

## 2017-10-06 ENCOUNTER — Inpatient Hospital Stay
Admit: 2017-10-06 | Discharge: 2017-10-06 | Disposition: A | Discharge status: Home / Self Care | Payer: Medicare Other | Attending: Family Medicine | Admitting: Family Medicine

## 2017-10-06 DIAGNOSIS — E7800 Pure hypercholesterolemia, unspecified: Secondary | ICD-10-CM | POA: Diagnosis not present

## 2017-10-06 DIAGNOSIS — E860 Dehydration: Secondary | ICD-10-CM | POA: Diagnosis present

## 2017-10-06 DIAGNOSIS — R402142 Coma scale, eyes open, spontaneous, at arrival to emergency department: Secondary | ICD-10-CM | POA: Diagnosis not present

## 2017-10-06 DIAGNOSIS — Z880 Allergy status to penicillin: Secondary | ICD-10-CM

## 2017-10-06 DIAGNOSIS — Z7984 Long term (current) use of oral hypoglycemic drugs: Secondary | ICD-10-CM

## 2017-10-06 DIAGNOSIS — G459 Transient cerebral ischemic attack, unspecified: Secondary | ICD-10-CM | POA: Diagnosis not present

## 2017-10-06 DIAGNOSIS — R29810 Facial weakness: Secondary | ICD-10-CM | POA: Diagnosis not present

## 2017-10-06 DIAGNOSIS — I447 Left bundle-branch block, unspecified: Secondary | ICD-10-CM | POA: Diagnosis present

## 2017-10-06 DIAGNOSIS — Z6833 Body mass index (BMI) 33.0-33.9, adult: Secondary | ICD-10-CM

## 2017-10-06 DIAGNOSIS — I639 Cerebral infarction, unspecified: Principal | ICD-10-CM | POA: Diagnosis present

## 2017-10-06 DIAGNOSIS — I482 Chronic atrial fibrillation: Secondary | ICD-10-CM | POA: Diagnosis not present

## 2017-10-06 DIAGNOSIS — I6529 Occlusion and stenosis of unspecified carotid artery: Secondary | ICD-10-CM | POA: Diagnosis not present

## 2017-10-06 DIAGNOSIS — R531 Weakness: Secondary | ICD-10-CM | POA: Diagnosis present

## 2017-10-06 DIAGNOSIS — E785 Hyperlipidemia, unspecified: Secondary | ICD-10-CM | POA: Diagnosis not present

## 2017-10-06 DIAGNOSIS — R402252 Coma scale, best verbal response, oriented, at arrival to emergency department: Secondary | ICD-10-CM | POA: Diagnosis not present

## 2017-10-06 DIAGNOSIS — R4781 Slurred speech: Secondary | ICD-10-CM | POA: Diagnosis present

## 2017-10-06 DIAGNOSIS — I5022 Chronic systolic (congestive) heart failure: Secondary | ICD-10-CM | POA: Diagnosis not present

## 2017-10-06 DIAGNOSIS — E119 Type 2 diabetes mellitus without complications: Secondary | ICD-10-CM | POA: Diagnosis present

## 2017-10-06 DIAGNOSIS — Z8249 Family history of ischemic heart disease and other diseases of the circulatory system: Secondary | ICD-10-CM

## 2017-10-06 DIAGNOSIS — Z950 Presence of cardiac pacemaker: Secondary | ICD-10-CM | POA: Diagnosis not present

## 2017-10-06 DIAGNOSIS — M1990 Unspecified osteoarthritis, unspecified site: Secondary | ICD-10-CM | POA: Diagnosis present

## 2017-10-06 DIAGNOSIS — Z7901 Long term (current) use of anticoagulants: Secondary | ICD-10-CM

## 2017-10-06 DIAGNOSIS — I2510 Atherosclerotic heart disease of native coronary artery without angina pectoris: Secondary | ICD-10-CM | POA: Diagnosis not present

## 2017-10-06 DIAGNOSIS — E669 Obesity, unspecified: Secondary | ICD-10-CM | POA: Diagnosis not present

## 2017-10-06 DIAGNOSIS — I6389 Other cerebral infarction: Secondary | ICD-10-CM | POA: Diagnosis not present

## 2017-10-06 DIAGNOSIS — Z7951 Long term (current) use of inhaled steroids: Secondary | ICD-10-CM

## 2017-10-06 DIAGNOSIS — I252 Old myocardial infarction: Secondary | ICD-10-CM

## 2017-10-06 DIAGNOSIS — I10 Essential (primary) hypertension: Secondary | ICD-10-CM | POA: Diagnosis not present

## 2017-10-06 DIAGNOSIS — R29703 NIHSS score 3: Secondary | ICD-10-CM | POA: Diagnosis not present

## 2017-10-06 DIAGNOSIS — Z885 Allergy status to narcotic agent status: Secondary | ICD-10-CM

## 2017-10-06 DIAGNOSIS — I110 Hypertensive heart disease with heart failure: Secondary | ICD-10-CM | POA: Diagnosis present

## 2017-10-06 DIAGNOSIS — Z8673 Personal history of transient ischemic attack (TIA), and cerebral infarction without residual deficits: Secondary | ICD-10-CM | POA: Diagnosis not present

## 2017-10-06 DIAGNOSIS — I4891 Unspecified atrial fibrillation: Secondary | ICD-10-CM | POA: Diagnosis not present

## 2017-10-06 DIAGNOSIS — G8194 Hemiplegia, unspecified affecting left nondominant side: Secondary | ICD-10-CM | POA: Diagnosis present

## 2017-10-06 DIAGNOSIS — Z66 Do not resuscitate: Secondary | ICD-10-CM | POA: Diagnosis not present

## 2017-10-06 DIAGNOSIS — R402362 Coma scale, best motor response, obeys commands, at arrival to emergency department: Secondary | ICD-10-CM | POA: Diagnosis present

## 2017-10-06 DIAGNOSIS — I6509 Occlusion and stenosis of unspecified vertebral artery: Secondary | ICD-10-CM | POA: Diagnosis not present

## 2017-10-06 DIAGNOSIS — Z951 Presence of aortocoronary bypass graft: Secondary | ICD-10-CM

## 2017-10-06 LAB — CBC
HEMATOCRIT: 33.6 % — AB (ref 35.0–47.0)
HEMOGLOBIN: 11.1 g/dL — AB (ref 12.0–16.0)
MCH: 28.6 pg (ref 26.0–34.0)
MCHC: 33 g/dL (ref 32.0–36.0)
MCV: 86.6 fL (ref 80.0–100.0)
Platelets: 292 10*3/uL (ref 150–440)
RBC: 3.88 MIL/uL (ref 3.80–5.20)
RDW: 14.1 % (ref 11.5–14.5)
WBC: 7.3 10*3/uL (ref 3.6–11.0)

## 2017-10-06 LAB — COMPREHENSIVE METABOLIC PANEL
ALK PHOS: 123 U/L (ref 38–126)
ALT: 11 U/L — AB (ref 14–54)
AST: 22 U/L (ref 15–41)
Albumin: 3.7 g/dL (ref 3.5–5.0)
Anion gap: 13 (ref 5–15)
BILIRUBIN TOTAL: 0.6 mg/dL (ref 0.3–1.2)
BUN: 18 mg/dL (ref 6–20)
CALCIUM: 9.3 mg/dL (ref 8.9–10.3)
CO2: 26 mmol/L (ref 22–32)
CREATININE: 1.13 mg/dL — AB (ref 0.44–1.00)
Chloride: 97 mmol/L — ABNORMAL LOW (ref 101–111)
GFR calc Af Amer: 48 mL/min — ABNORMAL LOW (ref >60)
GFR calc non Af Amer: 41 mL/min — ABNORMAL LOW (ref >60)
Glucose, Bld: 228 mg/dL — ABNORMAL HIGH (ref 65–99)
Potassium: 4 mmol/L (ref 3.5–5.1)
SODIUM: 136 mmol/L (ref 135–145)
Total Protein: 7.6 g/dL (ref 6.5–8.1)

## 2017-10-06 LAB — GLUCOSE, CAPILLARY
GLUCOSE-CAPILLARY: 216 mg/dL — AB (ref 65–99)
Glucose-Capillary: 127 mg/dL — ABNORMAL HIGH (ref 65–99)
Glucose-Capillary: 207 mg/dL — ABNORMAL HIGH (ref 65–99)

## 2017-10-06 LAB — DIFFERENTIAL
Basophils Absolute: 0.1 10*3/uL (ref 0–0.1)
Basophils Relative: 1 %
Eosinophils Absolute: 0.1 10*3/uL (ref 0–0.7)
Eosinophils Relative: 2 %
LYMPHS ABS: 1.3 10*3/uL (ref 1.0–3.6)
LYMPHS PCT: 18 %
MONOS PCT: 9 %
Monocytes Absolute: 0.6 10*3/uL (ref 0.2–0.9)
NEUTROS ABS: 5.2 10*3/uL (ref 1.4–6.5)
Neutrophils Relative %: 70 %

## 2017-10-06 LAB — PROTIME-INR
INR: 1.76
Prothrombin Time: 20.4 seconds — ABNORMAL HIGH (ref 11.4–15.2)

## 2017-10-06 LAB — APTT: aPTT: 47 seconds — ABNORMAL HIGH (ref 24–36)

## 2017-10-06 LAB — TROPONIN I: Troponin I: <0.03 ng/mL (ref <0.03)

## 2017-10-06 MED ORDER — LACTATED RINGERS IV SOLN
INTRAVENOUS | Status: DC
  Administered 2017-10-06: 17:00:00 via INTRAVENOUS

## 2017-10-06 MED ORDER — CALCIUM CARBONATE-VITAMIN D 500-200 MG-UNIT PO TABS
1.0000 | ORAL_TABLET | Freq: Two times a day (BID) | ORAL | Status: DC
  Administered 2017-10-07: 11:00:00 1 via ORAL
  Filled 2017-10-06 (×2): qty 1

## 2017-10-06 MED ORDER — DULOXETINE HCL 30 MG PO CPEP
60.0000 mg | ORAL_CAPSULE | Freq: Every day | ORAL | Status: DC
  Administered 2017-10-07: 60 mg via ORAL
  Filled 2017-10-06: qty 2

## 2017-10-06 MED ORDER — STROKE: EARLY STAGES OF RECOVERY BOOK
Freq: Once | Status: AC
  Administered 2017-10-06: 16:00:00

## 2017-10-06 MED ORDER — ATORVASTATIN CALCIUM 20 MG PO TABS
40.0000 mg | ORAL_TABLET | Freq: Every day | ORAL | Status: DC
  Administered 2017-10-06: 22:00:00 40 mg via ORAL
  Filled 2017-10-06: qty 2

## 2017-10-06 MED ORDER — ACETAMINOPHEN 650 MG RE SUPP: 650.0000 mg | RECTAL | Status: DC | PRN

## 2017-10-06 MED ORDER — ACETAMINOPHEN 500 MG PO TABS: 1000.0000 mg | ORAL_TABLET | Freq: Four times a day (QID) | ORAL | Status: DC | PRN

## 2017-10-06 MED ORDER — ASPIRIN EC 81 MG PO TBEC
81.0000 mg | DELAYED_RELEASE_TABLET | Freq: Every day | ORAL | Status: DC
  Administered 2017-10-06 – 2017-10-07 (×2): 81 mg via ORAL
  Filled 2017-10-06: qty 1

## 2017-10-06 MED ORDER — FLUTICASONE PROPIONATE 50 MCG/ACT NA SUSP: 2.0000 | Freq: Every day | NASAL | Status: DC

## 2017-10-06 MED ORDER — VITAMIN D 1000 UNITS PO TABS
1000.0000 [IU] | ORAL_TABLET | ORAL | Status: DC
  Administered 2017-10-07: 11:00:00 1000 [IU] via ORAL
  Filled 2017-10-06: qty 1

## 2017-10-06 MED ORDER — IOPAMIDOL (ISOVUE-370) INJECTION 76%
60.0000 mL | Freq: Once | INTRAVENOUS | Status: AC | PRN
  Administered 2017-10-06: 17:00:00 60 mL via INTRAVENOUS

## 2017-10-06 MED ORDER — INSULIN ASPART 100 UNIT/ML ~~LOC~~ SOLN
0.0000 [IU] | Freq: Every day | SUBCUTANEOUS | Status: DC
  Administered 2017-10-06: 2 [IU] via SUBCUTANEOUS
  Filled 2017-10-06: qty 1

## 2017-10-06 MED ORDER — ACETAMINOPHEN 325 MG PO TABS: 650.0000 mg | ORAL_TABLET | ORAL | Status: DC | PRN

## 2017-10-06 MED ORDER — ALBUTEROL SULFATE (2.5 MG/3ML) 0.083% IN NEBU: 3.0000 mL | INHALATION_SOLUTION | Freq: Four times a day (QID) | RESPIRATORY_TRACT | Status: DC | PRN

## 2017-10-06 MED ORDER — SENNOSIDES-DOCUSATE SODIUM 8.6-50 MG PO TABS: 1.0000 | ORAL_TABLET | Freq: Every evening | ORAL | Status: DC | PRN

## 2017-10-06 MED ORDER — DILTIAZEM HCL ER COATED BEADS 180 MG PO CP24
180.0000 mg | ORAL_CAPSULE | Freq: Every day | ORAL | Status: DC
  Administered 2017-10-07: 180 mg via ORAL
  Filled 2017-10-06: qty 1

## 2017-10-06 MED ORDER — LOSARTAN POTASSIUM 25 MG PO TABS
25.0000 mg | ORAL_TABLET | Freq: Every day | ORAL | Status: DC
Start: 2017-10-07 — End: 2017-10-07
  Administered 2017-10-07: 11:00:00 25 mg via ORAL
  Filled 2017-10-06: qty 1

## 2017-10-06 MED ORDER — ACETAMINOPHEN 160 MG/5ML PO SOLN
650.0000 mg | ORAL | Status: DC | PRN
  Filled 2017-10-06: qty 20.3

## 2017-10-06 MED ORDER — NITROGLYCERIN 0.4 MG SL SUBL: 0.4000 mg | SUBLINGUAL_TABLET | SUBLINGUAL | Status: DC | PRN

## 2017-10-06 MED ORDER — INSULIN ASPART 100 UNIT/ML ~~LOC~~ SOLN
0.0000 [IU] | Freq: Three times a day (TID) | SUBCUTANEOUS | Status: DC
  Administered 2017-10-06: 17:00:00 2 [IU] via SUBCUTANEOUS
  Administered 2017-10-07: 13:00:00 3 [IU] via SUBCUTANEOUS
  Administered 2017-10-07: 2 [IU] via SUBCUTANEOUS
  Filled 2017-10-06 (×2): qty 1

## 2017-10-06 MED ORDER — APIXABAN 5 MG PO TABS
5.0000 mg | ORAL_TABLET | Freq: Two times a day (BID) | ORAL | Status: DC
  Administered 2017-10-06 – 2017-10-07 (×2): 5 mg via ORAL
  Filled 2017-10-06 (×2): qty 1

## 2017-10-06 MED ORDER — ATENOLOL 100 MG PO TABS
100.0000 mg | ORAL_TABLET | Freq: Every day | ORAL | Status: DC
  Administered 2017-10-07: 100 mg via ORAL
  Filled 2017-10-06: qty 1

## 2017-10-06 NOTE — Consult Note (Signed)
Referring Physician: Mayford Knife    Chief Complaint: UE weakness and slurred speech  HPI: Veronica Short is an 84 y.o. female who reports eating breakfast this morning and having acute onset of difficulty using the right arm.  Now reports as if there is a heaviness in both arms.  Also noted to have some slurred speech.  When her daughter arrived home was brought in for evaluation.  Initial NIHSS of 3  Date last known well: Date: 10/06/2017 Time last known well: Time: 09:30 tPA Given: No: Improvement in smyptoms, On Eliquis  Modified Rankin: Rankin Score=4  Past Medical History:  Diagnosis Date  . Arthritis   . CAD (coronary artery disease)   . CHF (congestive heart failure) (HCC)   . Diabetes mellitus without complication (HCC)   . High cholesterol   . Hypertension     Past Surgical History:  Procedure Laterality Date  . CHOLECYSTECTOMY    . CORONARY ARTERY BYPASS GRAFT    . PACEMAKER INSERTION      Family history: Father with CAD, deceased from MI.  Mother with CAD and DM.  Sister with breast and skin cancer.  Social History:  reports that she has never smoked. She has never used smokeless tobacco. She reports that she does not drink alcohol or use drugs.  Allergies:  Allergies  Allergen Reactions  . Morphine Anxiety    NUMBNESS  . Penicillins Hives and Rash    Has patient had a PCN reaction causing immediate rash, facial/tongue/throat swelling, SOB or lightheadedness with hypotension: No Has patient had a PCN reaction causing severe rash involving mucus membranes or skin necrosis: No Has patient had a PCN reaction that required hospitalization: No Has patient had a PCN reaction occurring within the last 10 years: No If all of the above answers are "NO", then may proceed with Cephalosporin use.     Medications: I have reviewed the patient's current medications. Prior to Admission:  Prior to Admission medications   Medication Sig Start Date End Date Taking? Authorizing  Provider  acetaminophen (TYLENOL) 500 MG tablet Take 1,000 mg by mouth every 6 (six) hours as needed for mild pain.   Yes [provider]  albuterol (PROVENTIL HFA;VENTOLIN HFA) 108 (90 Base) MCG/ACT inhaler Inhale 2 puffs into the lungs every 6 (six) hours as needed for wheezing or shortness of breath. 10/27/15  Yes Leona Carry, MD  apixaban (ELIQUIS) 5 MG TABS tablet Take 1 tablet (5 mg total) by mouth 2 (two) times daily. 01/04/15  Yes Tat, Onalee Hua, MD  atenolol (TENORMIN) 100 MG tablet Take 100 mg by mouth daily.  10/14/13  Yes [provider]  atorvastatin (LIPITOR) 40 MG tablet Take 1 tablet (40 mg total) by mouth at bedtime. 01/04/15  Yes Tat, Onalee Hua, MD  Calcium Carbonate-Vitamin D (CALCIUM 600+D) 600-400 MG-UNIT tablet Take 2 tablets by mouth 2 (two) times daily.   Yes [provider]  CARTIA XT 180 MG 24 hr capsule Take 180 mg by mouth daily.  04/05/17  Yes [provider]  Cholecalciferol (VITAMIN D PO) Take 1 tablet by mouth every morning.   Yes [provider]  DULoxetine (CYMBALTA) 60 MG capsule Take 60 mg by mouth daily.    Yes [provider]  fluticasone (FLONASE) 50 MCG/ACT nasal spray Place 2 sprays into both nostrils daily.   Yes [provider]  furosemide (LASIX) 20 MG tablet Take 20 mg by mouth daily. 07/14/17  Yes [provider]  losartan (COZAAR)  25 MG tablet Take 25 mg by mouth daily.   Yes [provider]  metFORMIN (GLUCOPHAGE-XR) 500 MG 24 hr tablet Take 500 mg by mouth daily with breakfast.  11/01/13  Yes [provider]  nitroGLYCERIN (NITROSTAT) 0.4 MG SL tablet Place 0.4 mg under the tongue every 5 (five) minutes as needed for chest pain.   Yes [provider]  traMADol (ULTRAM) 50 MG tablet Take 50 mg by mouth every 6 (six) hours as needed for moderate pain.   Yes [provider]     ROS: History obtained from the patient  General ROS: negative for - chills,  fatigue, fever, night sweats, weight gain or weight loss Psychological ROS: negative for - behavioral disorder, hallucinations, memory difficulties, mood swings or suicidal ideation Ophthalmic ROS: negative for - blurry vision, double vision, eye pain or loss of vision ENT ROS: dry throat Allergy and Immunology ROS: negative for - hives or itchy/watery eyes Hematological and Lymphatic ROS: negative for - bleeding problems, bruising or swollen lymph nodes Endocrine ROS: negative for - galactorrhea, hair pattern changes, polydipsia/polyuria or temperature intolerance Respiratory ROS: negative for - cough, hemoptysis, shortness of breath or wheezing Cardiovascular ROS: negative for - chest pain, dyspnea on exertion, edema or irregular heartbeat Gastrointestinal ROS: negative for - abdominal pain, diarrhea, hematemesis, nausea/vomiting or stool incontinence Genito-Urinary ROS: negative for - dysuria, hematuria, incontinence or urinary frequency/urgency Musculoskeletal ROS: neck pain, joint pain Neurological ROS: as noted in HPI Dermatological ROS: negative for rash and skin lesion changes  Physical Examination: Blood pressure (!) 157/57, pulse 78, temperature 97.8 F (36.6 C), temperature source Oral, resp. rate 18, height 5\' 4"  (1.626 m), weight 63.2 kg (139 lb 5.3 oz), SpO2 98 %.  HEENT-  Normocephalic, no lesions, without obvious abnormality.  Normal external eye and conjunctiva.  Normal TM's bilaterally.  Normal auditory canals and external ears. Normal external nose, mucus membranes and septum.  Normal pharynx. Cardiovascular- S1, S2 normal, pulses palpable throughout   Lungs- chest clear, no wheezing, rales, normal symmetric air entry Abdomen- soft, non-tender; bowel sounds normal; no masses,  no organomegaly Extremities- no edema Lymph-no adenopathy palpable Musculoskeletal-no joint tenderness, deformity or swelling Skin-warm and dry, no hyperpigmentation, vitiligo, or suspicious  lesions  Neurological Examination   Mental Status: Alert, oriented to name and age but not to month, thought content appropriate.  Speech fluent without evidence of aphasia.  Dysarthric.  Able to follow 3 step commands with some reinforcement. Cranial Nerves: II: Discs flat bilaterally; Visual fields grossly normal, pupils equal, round, reactive to light and accommodation III,IV, VI: ptosis not present, extra-ocular motions intact bilaterally V,VII: mild decrease in the right NLF, facial light touch sensation normal bilaterally VIII: hearing normal bilaterally IX,X: gag reflex present XI: bilateral shoulder shrug XII: midline tongue extension Motor: Limited ROM in both upper extremities due to shoulder abnormalities.  Good hand grip bilaterally.  Able to lift and maintain both legs off the bed.   Sensory: Pinprick and light touch intact throughout, bilaterally Deep Tendon Reflexes: 2+ and symmetric with absent AJ's bilaterally Plantars: Right: mute   Left: mute Cerebellar: Normal finger-to-nose and normal heel-to-shin testing bilaterally Gait: not tested due to safety concerns    Laboratory Studies:  Basic Metabolic Panel: No results for input(s): NA, K, CL, CO2, GLUCOSE, BUN, CREATININE, CALCIUM, MG, PHOS in the last 168 hours.  Liver Function Tests: No results for input(s): AST, ALT, ALKPHOS, BILITOT, PROT, ALBUMIN in the last 168 hours. No results for input(s):  LIPASE, AMYLASE in the last 168 hours. No results for input(s): AMMONIA in the last 168 hours.  CBC: No results for input(s): WBC, NEUTROABS, HGB, HCT, MCV, PLT in the last 168 hours.  Cardiac Enzymes: No results for input(s): CKTOTAL, CKMB, CKMBINDEX, TROPONINI in the last 168 hours.  BNP: Invalid input(s): POCBNP  CBG: Recent Labs  Lab 10/06/17 1229  GLUCAP 216*    Microbiology: No results found for this or any previous visit.  Coagulation Studies: No results for input(s): LABPROT, INR in the last 72  hours.  Urinalysis: No results for input(s): COLORURINE, LABSPEC, PHURINE, GLUCOSEU, HGBUR, BILIRUBINUR, KETONESUR, PROTEINUR, UROBILINOGEN, NITRITE, LEUKOCYTESUR in the last 168 hours.  Invalid input(s): APPERANCEUR  Lipid Panel:    Component Value Date/Time   CHOL 157 03/27/2017 0455   CHOL 167 02/25/2012 0110   TRIG 153 (H) 03/27/2017 0455   TRIG 313 (H) 02/25/2012 0110   HDL 43 03/27/2017 0455   HDL 31 (L) 02/25/2012 0110   CHOLHDL 3.7 03/27/2017 0455   VLDL 31 03/27/2017 0455   VLDL 63 (H) 02/25/2012 0110   LDLCALC 83 03/27/2017 0455   LDLCALC 73 02/25/2012 0110    HgbA1C:  Lab Results  Component Value Date   HGBA1C 6.8 (H) 03/27/2017    Urine Drug Screen:  No results found for: LABOPIA, COCAINSCRNUR, LABBENZ, AMPHETMU, THCU, LABBARB  Alcohol Level: No results for input(s): ETH in the last 168 hours.  Other results: EKG: ventricular paced at 77 bpm  Imaging: Ct Head Code Stroke Wo Contrast  Result Date: 10/06/2017 CLINICAL DATA:  Code stroke. New onset of left arm weakness and slurred speech beginning at 9:30 a.m. today. Left-sided facial droop. EXAM: CT HEAD WITHOUT CONTRAST TECHNIQUE: Contiguous axial images were obtained from the base of the skull through the vertex without intravenous contrast. COMPARISON:  CT head without contrast 04/22/2017. FINDINGS: Brain: Moderate atrophy and white matter changes are stable bilaterally. Remote lacunar infarcts of the basal ganglia and internal capsule, right greater than left, are similar the prior study. No new infarcts are present. No acute hemorrhage or mass lesion is present. Vascular: Atherosclerotic calcifications are again noted in the cavernous internal carotid arteries bilaterally without a hyperdense vessel. Skull: Calvarium is intact. No focal lytic or blastic lesions are present. Sinuses/Orbits: Chronic right sphenoid sinus opacification is again noted. The remaining paranasal sinuses and the mastoid air cells are clear.  Bilateral lens replacements are noted. Globes and orbits are within normal limits otherwise. ASPECTS [facility]([place] Stroke Program Early CT Score) - Ganglionic level infarction (caudate, lentiform nuclei, internal capsule, insula, M1-M3 cortex): 7/7 - Supraganglionic infarction (M4-M6 cortex): 3/3 Total score (0-10 with 10 being normal): 10/10 IMPRESSION: 1. No acute intracranial abnormality or significant interval change. 2. Stable moderate atrophy and white matter disease. This likely reflects the sequela of chronic microvascular ischemia. 3. Stable remote lacunar infarcts involving the basal ganglia and internal capsule, right greater than left. 4. ASPECTS is 10/10 These results were called by telephone at the time of interpretation on 10/06/2017 at 12:31 pm to Dr. Willy EddyPATRICK ROBINSON , who verbally acknowledged these results. Electronically Signed   By: Marin Robertshristopher  Mattern M.D.   On: 10/06/2017 12:34    Assessment: 84 y.o. female presenting with slurred speech and right sided weakness.  Symptoms are improving.  Patient on Eliquis at home and has been compliant.  Head CT reviewed and shows no acute changes.  MRI unable to be performed due to pacer.  Daughter does not feel patient has  returned to baseline.  Suspect TIA versus small infarct with embolic etiology.    Stroke Risk Factors - atrial fibrillation, diabetes mellitus, hyperlipidemia and hypertension  Plan: 1. HgbA1c, fasting lipid panel 2. PT consult, OT consult, Speech consult 3. Carotid dopplers 4. Prophylactic therapy-Continue Eliquis 5. NPO until RN stroke swallow screen 6. Telemetry monitoring 7. Frequent neuro checks    Thana Farr, MD Neurology [phone] 10/06/2017, 12:48 PM

## 2017-10-06 NOTE — ED Notes (Signed)
The EKG was competed and signed by Dr. Mayford KnifeWilliams. The EKG was also exported into the system.

## 2017-10-06 NOTE — Progress Notes (Signed)
SLP Cancellation Note  Patient Details Name: Veronica Short MRN: [id number] DOB: August 21, 1923   Cancelled treatment:       Reason Eval/Treat Not Completed: SLP screened, no needs identified, will sign off(chart reviewed; consulted NSG then met w/ pt and Dtr.). Pt denied any difficulty swallowing and has currently passed the swallow screen for an oral diet - cut meats per pt request. Pt conversed at conversational level w/out deficits noted; pt and family denied any new speech-language deficits. Facial droop has resolved but noted pt's speech is slightly "thick" - however, pt c/o extremely Dry Mouth which she and Dtr stated was Chronic for her(Biotene does not help her). Discussed few ways to address dry mouth including keeping ice chips close at chairside, jello.   No further skilled ST services indicated at this time as pt appears at her baseline. Pt agreed. NSG to reconsult if any change in status.    Orinda Kenner, MS, CCC-SLP Elonna Mcfarlane 10/06/2017, 4:45 PM

## 2017-10-06 NOTE — ED Notes (Signed)
     Hourly Rounding  Assessment Alert;Vital signs WDL  Intervention Call light w/in reach;Stretcher locked in lowest position;Pain assessed   Family at bedside. Pt is eating graham crackers and drinking water with family at bedside.

## 2017-10-06 NOTE — ED Notes (Signed)
Labs sent at this time.

## 2017-10-06 NOTE — Code Documentation (Signed)
Pt arrives with slurred speech and weakness that began at 0930 this AM, pt states she woke up WNL and then symptoms developed at 0930, Code Stroke activated in triage, pt taken from triage to Ct for head non-con CT and then room 9, NIHSS 3, right arm drift, mild dysarthia noted, No tPA due to pt on eliquis BID, pt has pacemaker, family at bedside, [facility]

## 2017-10-06 NOTE — ED Notes (Addendum)
LNW by family was at 2200 10/05/17. Pt reports at 0930AM today left arm weakness and thick speech at this time of arrival. Pt told daughter about sypmtoms at 1130 when daughter came home, and brought to ER. Pt does have mild left arm weakness and slurred speech, left sided facial droop with smiling. Hx of stroke. Unknown what baseline from previous stroke is. Alert and oriented X 4 currently. Color WNL. RR even and unlabored.  Spoke with Dr. Willy EddyPatrick Robinson regarding patient timeline and symptoms, verbal orders received.

## 2017-10-06 NOTE — ED Notes (Signed)
Stroke swallow screen done. Pt completed w/o difficulty.

## 2017-10-06 NOTE — ED Provider Notes (Signed)
[facility] Emergency Department Provider Note       Time seen: ----------------------------------------- 12:25 PM on 10/06/2017 -----------------------------------------   I have reviewed the triage vital signs and the nursing notes.  HISTORY   Chief Complaint No chief complaint on file.    HPI Veronica Short is a 84 y.o. female with a history of arthritis, coronary artery disease, CHF, diabetes, hyperlipidemia and hypertension who presents to the ED for possible stroke.  He reports patient had noticed some left arm weakness and some difficulty speaking.  Reportedly she had a stroke about 2 years ago with perhaps right arm weakness according to the daughter.  Currently she takes Eliquis for same.  She has not had any recent illness.  Past Medical History:  Diagnosis Date  . Arthritis   . CAD (coronary artery disease)   . CHF (congestive heart failure) (HCC)   . Diabetes mellitus without complication (HCC)   . High cholesterol   . Hypertension     Patient Active Problem List   Diagnosis Date Noted  . Speech disorder 01/31/2015  . Closed 2-part nondisplaced fracture of surgical neck of left humerus 01/07/2015  . Rotator cuff arthropathy 01/07/2015  . Left rotator cuff tear arthropathy 01/07/2015  . TIA (transient ischemic attack) 01/03/2015  . CAD (coronary artery disease) 01/03/2015  . Diabetes mellitus without complication (HCC) 01/03/2015  . Hypertension 01/03/2015  . High cholesterol 01/03/2015  . Cerebral thrombosis with cerebral infarction (HCC) 01/03/2015  . Cerebral infarction due to thrombosis of cerebral artery (HCC) 01/03/2015  . Transient cerebral ischemia 01/03/2015  . Atrial fibrillation, unspecified   . Speech disturbance   . Osteoarthritis 03/23/2014  . Aortic valve disorder 11/20/2013  . CAD (coronary artery disease), native coronary artery 11/20/2013  . DDD (degenerative disc disease), lumbosacral 11/20/2013  . Obesity,  unspecified 11/20/2013  . Osteoporosis 11/20/2013  . Type II or unspecified type diabetes mellitus without mention of complication, not stated as uncontrolled 11/20/2013  . Atrial fibrillation (HCC) 11/01/2013  . Other and unspecified hyperlipidemia 11/01/2013  . S/P CABG x 4 11/01/2013  . Pacemaker 02/22/2012  . H/O cardiac catheterization 11/10/1993  . MI (myocardial infarction) (HCC) 01/15/1991    Past Surgical History:  Procedure Laterality Date  . CHOLECYSTECTOMY    . CORONARY ARTERY BYPASS GRAFT    . PACEMAKER INSERTION      Allergies Morphine and Penicillins  Social History Social History   Tobacco Use  . Smoking status: Never Smoker  . Smokeless tobacco: Never Used  Substance Use Topics  . Alcohol use: No  . Drug use: No   Review of Systems Constitutional: Negative for fever. Cardiovascular: Negative for chest pain. Respiratory: Negative for shortness of breath. Gastrointestinal: Negative for abdominal pain, vomiting and diarrhea. Genitourinary: Negative for dysuria. Musculoskeletal: Negative for back pain. Skin: Negative for rash. Neurological: Positive for speech disturbance, left arm weakness  All systems negative/normal/unremarkable except as stated in the HPI  ____________________________________________   PHYSICAL EXAM:  VITAL SIGNS: ED Triage Vitals [10/06/17 1215]  Enc Vitals Group     BP (!) 157/57     Pulse Rate 78     Resp 18     Temp 97.8 F (36.6 C)     Temp Source Oral     SpO2 97 %     Weight      Height      Head Circumference      Peak Flow      Pain Score  Pain Loc      Pain Edu?      Excl. in GC?    Constitutional: Alert and oriented. Well appearing and in no distress. Eyes: Conjunctivae are normal. Normal extraocular movements. ENT   Head: Normocephalic and atraumatic.   Nose: No congestion/rhinnorhea.   Mouth/Throat: Mucous membranes are moist.   Neck: No stridor. Cardiovascular: Normal rate,  regular rhythm. No murmurs, rubs, or gallops. Respiratory: Normal respiratory effort without tachypnea nor retractions. Breath sounds are clear and equal bilaterally. No wheezes/rales/rhonchi. Gastrointestinal: Soft and nontender. Normal bowel sounds Musculoskeletal: Nontender with normal range of motion in extremities. No lower extremity tenderness nor edema. Neurologic: Slurred speech is noted, left arm weakness is mild compared to right, obvious left-sided facial drooping is noted. Skin:  Skin is warm, dry and intact. No rash noted. Psychiatric: Mood and affect are normal. Speech and behavior are normal.  ____________________________________________  EKG: Interpreted by me.  Atrial fibrillation with a rate of 77 bpm, incomplete left bundle branch block, normal axis, normal QT  ____________________________________________  ED COURSE:  As part of my medical decision making, I reviewed the following data within the electronic MEDICAL RECORD NUMBER History obtained from family if available, nursing notes, old chart and ekg, as well as notes from prior ED visits. Patient presented for possible CVA, we will assess with labs and imaging as indicated at this time.   Procedures ____________________________________________   LABS (pertinent positives/negatives)  Labs Reviewed  PROTIME-INR - Abnormal; Notable for the following components:      Result Value   Prothrombin Time 20.4 (*)    All other components within normal limits  APTT - Abnormal; Notable for the following components:   aPTT 47 (*)    All other components within normal limits  CBC - Abnormal; Notable for the following components:   Hemoglobin 11.1 (*)    HCT 33.6 (*)    All other components within normal limits  GLUCOSE, CAPILLARY - Abnormal; Notable for the following components:   Glucose-Capillary 216 (*)    All other components within normal limits  DIFFERENTIAL  COMPREHENSIVE METABOLIC PANEL  TROPONIN I  CBG MONITORING,  ED    RADIOLOGY Images were viewed by me  CT head is negative  ____________________________________________  DIFFERENTIAL DIAGNOSIS   CVA, TIA, occult infection, dehydration, electrolyte abnormality  FINAL ASSESSMENT AND PLAN  CVA   Plan: The patient had presented for possible CVA. Patient's labs are grossly unremarkable. Patient's imaging regarding head CT was negative.  Patient was evaluated by neurology while in the ER and code stroke was activated.  She is of course not a TPA candidate being on Eliquis.  Patient has been recommended for admission and is currently stable.   Ulice DashJohnathan E Berania Peedin, MD   Note: This note was generated in part or whole with voice recognition software. Voice recognition is usually quite accurate but there are transcription errors that can and very often do occur. I apologize for any typographical errors that were not detected and corrected.     Emily FilbertWilliams, Dmitri Pettigrew E, MD 10/06/17 [phone]

## 2017-10-06 NOTE — Progress Notes (Signed)
Family Meeting Note  Advance Directive:yes  Today a meeting took place with the Patient.  Patient is able to participate:   The following clinical team members were present during this meeting:MD  The following were discussed:Patient's diagnosis: CVA, chronic A. fib, congestive heart failure, diabetes, hyperlipidemia, hypertension, coronary artery disease, Patient's progosis: Unable to determine and Goals for treatment: Continue present management  Additional follow-up to be provided: as needed  Time spent during discussion:20 minutes  Bertrum SolMontell D Salary, MD

## 2017-10-06 NOTE — ED Notes (Signed)
Pt family reports s/s started at 0930 today pt is currently on eliqus EDP and NEURO at bedside at this time. CBG 214

## 2017-10-06 NOTE — H&P (Signed)
[employer] [facility] [facility] [place] [facility]   PATIENT NAME: Veronica Short    MR#:  [id number]  DATE OF BIRTH:  1924-05-31  DATE OF ADMISSION:  10/06/2017  PRIMARY CARE PHYSICIAN: Kandyce Rud, MD   REQUESTING/REFERRING PHYSICIAN:   CHIEF COMPLAINT:  No chief complaint on file.   HISTORY OF PRESENT ILLNESS: Veronica Short  is a 84 y.o. female with a known history per below last known normal last night, woke at 9:30 AM with left-sided weakness, slurred speech, thick speech, noted facial droop this afternoon by daughter, brought to the emergency room for further evaluation/care, patient on Eliquis for A. fib chronically, INR was 1.7, CT head was negative, glucose 228, rest of her investigations were unimpressive, patient evaluated the bedside, daughter at the bedside, no apparent distress, resting comfortably in bed, no facial droop noted, patient is now been admitted for acute possible CVA versus TIA.  PAST MEDICAL HISTORY:   Past Medical History:  Diagnosis Date  . Arthritis   . CAD (coronary artery disease)   . CHF (congestive heart failure) (HCC)   . Diabetes mellitus without complication (HCC)   . High cholesterol   . Hypertension     PAST SURGICAL HISTORY:  Past Surgical History:  Procedure Laterality Date  . CHOLECYSTECTOMY    . CORONARY ARTERY BYPASS GRAFT    . PACEMAKER INSERTION      SOCIAL HISTORY:  Social History   Tobacco Use  . Smoking status: Never Smoker  . Smokeless tobacco: Never Used  Substance Use Topics  . Alcohol use: No    FAMILY HISTORY: HTN  DRUG ALLERGIES:  Allergies  Allergen Reactions  . Morphine Anxiety    NUMBNESS  . Penicillins Hives and Rash    Has patient had a PCN reaction causing immediate rash, facial/tongue/throat swelling, SOB or lightheadedness with hypotension: No Has patient had a PCN reaction causing severe rash involving mucus membranes or skin necrosis: No Has patient had a PCN reaction that required  hospitalization: No Has patient had a PCN reaction occurring within the last 10 years: No If all of the above answers are "NO", then may proceed with Cephalosporin use.     REVIEW OF SYSTEMS:   CONSTITUTIONAL: No fever, fatigue + weakness.  EYES: No blurred or double vision.  EARS, NOSE, AND THROAT: No tinnitus or ear pain.  RESPIRATORY: No cough, shortness of breath, wheezing or hemoptysis.  CARDIOVASCULAR: No chest pain, orthopnea, edema.  GASTROINTESTINAL: No nausea, vomiting, diarrhea or abdominal pain.  GENITOURINARY: No dysuria, hematuria.  ENDOCRINE: No polyuria, nocturia,  HEMATOLOGY: No anemia, easy bruising or bleeding SKIN: No rash or lesion. MUSCULOSKELETAL: No joint pain or arthritis.   NEUROLOGIC: No tingling, left arm/leg weakness, slurred speech, thick speech, facial droop per daughter PSYCHIATRY: No anxiety or depression.   MEDICATIONS AT HOME:  Prior to Admission medications   Medication Sig Start Date End Date Taking? Authorizing Provider  acetaminophen (TYLENOL) 500 MG tablet Take 1,000 mg by mouth every 6 (six) hours as needed for mild pain.   Yes [provider]  albuterol (PROVENTIL HFA;VENTOLIN HFA) 108 (90 Base) MCG/ACT inhaler Inhale 2 puffs into the lungs every 6 (six) hours as needed for wheezing or shortness of breath. 10/27/15  Yes Leona Carry, MD  apixaban (ELIQUIS) 5 MG TABS tablet Take 1 tablet (5 mg total) by mouth 2 (two) times daily. 01/04/15  Yes Tat, Onalee Hua, MD  atenolol (TENORMIN) 100 MG tablet Take 100 mg by mouth daily.  10/14/13  Yes [provider]  atorvastatin (LIPITOR) 40 MG tablet Take 1 tablet (40 mg total) by mouth at bedtime. 01/04/15  Yes Tat, Onalee Hua, MD  Calcium Carbonate-Vitamin D (CALCIUM 600+D) 600-400 MG-UNIT tablet Take 2 tablets by mouth 2 (two) times daily.   Yes [provider]  CARTIA XT 180 MG 24 hr capsule Take 180 mg by mouth daily.  04/05/17  Yes [provider]  Cholecalciferol (VITAMIN D  PO) Take 1 tablet by mouth every morning.   Yes [provider]  DULoxetine (CYMBALTA) 60 MG capsule Take 60 mg by mouth daily.    Yes [provider]  fluticasone (FLONASE) 50 MCG/ACT nasal spray Place 2 sprays into both nostrils daily.   Yes [provider]  furosemide (LASIX) 20 MG tablet Take 20 mg by mouth daily. 07/14/17  Yes [provider]  losartan (COZAAR) 25 MG tablet Take 25 mg by mouth daily.   Yes [provider]  metFORMIN (GLUCOPHAGE-XR) 500 MG 24 hr tablet Take 500 mg by mouth daily with breakfast.  11/01/13  Yes [provider]  nitroGLYCERIN (NITROSTAT) 0.4 MG SL tablet Place 0.4 mg under the tongue every 5 (five) minutes as needed for chest pain.   Yes [provider]  traMADol (ULTRAM) 50 MG tablet Take 50 mg by mouth every 6 (six) hours as needed for moderate pain.   Yes [provider]      PHYSICAL EXAMINATION:   VITAL SIGNS: Blood pressure (!) 157/57, pulse 78, temperature 97.8 F (36.6 C), temperature source Oral, resp. rate 18, height 5\' 4"  (1.626 m), weight 63.2 kg (139 lb 5.3 oz), SpO2 98 %.  GENERAL:  84 y.o.-year-old patient lying in the bed with no acute distress.  Obesity eYES: Pupils equal, round, reactive to light and accommodation. No scleral icterus. Extraocular muscles intact.  HEENT: Head atraumatic, normocephalic. Oropharynx and nasopharynx clear.  NECK:  Supple, no jugular venous distention. No thyroid enlargement, no tenderness.  LUNGS: Normal breath sounds bilaterally, no wheezing, rales,rhonchi or crepitation. No use of accessory muscles of respiration.  CARDIOVASCULAR: S1, S2 normal. No murmurs, rubs, or gallops.  ABDOMEN: Soft, nontender, nondistended. Bowel sounds present. No organomegaly or mass.  EXTREMITIES: No pedal edema, cyanosis, or clubbing.  NEUROLOGIC: Cranial nerves II through XII are intact. MAES.  Negative pronator drift.  Globally weak.  Gait not checked.   PSYCHIATRIC: The patient is alert and oriented x 3.  SKIN: No obvious rash, lesion, or ulcer.   LABORATORY PANEL:   CBC Recent Labs  Lab 10/06/17 1235  WBC 7.3  HGB 11.1*  HCT 33.6*  PLT 292  MCV 86.6  MCH 28.6  MCHC 33.0  RDW 14.1  LYMPHSABS 1.3  MONOABS 0.6  EOSABS 0.1  BASOSABS 0.1   ------------------------------------------------------------------------------------------------------------------  Chemistries  Recent Labs  Lab 10/06/17 1235  NA 136  K 4.0  CL 97*  CO2 26  GLUCOSE 228*  BUN 18  CREATININE 1.13*  CALCIUM 9.3  AST 22  ALT 11*  ALKPHOS 123  BILITOT 0.6   ------------------------------------------------------------------------------------------------------------------ estimated creatinine clearance is 28 mL/min (A) (by C-G formula based on SCr of 1.13 mg/dL (H)). ------------------------------------------------------------------------------------------------------------------ No results for input(s): TSH, T4TOTAL, T3FREE, THYROIDAB in the last 72 hours.  Invalid input(s): FREET3   Coagulation profile Recent Labs  Lab 10/06/17 1235  INR 1.76   ------------------------------------------------------------------------------------------------------------------- No results for input(s): DDIMER in the last 72 hours. -------------------------------------------------------------------------------------------------------------------  Cardiac Enzymes Recent Labs  Lab 10/06/17 1235  TROPONINI <0.03   ------------------------------------------------------------------------------------------------------------------ Invalid input(s): POCBNP  ---------------------------------------------------------------------------------------------------------------  Urinalysis    Component Value Date/Time   COLORURINE YELLOW (A) 04/22/2017 1607   APPEARANCEUR CLEAR (A) 04/22/2017 1607   LABSPEC 1.011 04/22/2017 1607   PHURINE 6.0 04/22/2017 1607    GLUCOSEU NEGATIVE 04/22/2017 1607   HGBUR NEGATIVE 04/22/2017 1607   BILIRUBINUR NEGATIVE 04/22/2017 1607   KETONESUR NEGATIVE 04/22/2017 1607   PROTEINUR NEGATIVE 04/22/2017 1607   NITRITE NEGATIVE 04/22/2017 1607   LEUKOCYTESUR NEGATIVE 04/22/2017 1607     RADIOLOGY: Ct Head Code Stroke Wo Contrast  Result Date: 10/06/2017 CLINICAL DATA:  Code stroke. New onset of left arm weakness and slurred speech beginning at 9:30 a.m. today. Left-sided facial droop. EXAM: CT HEAD WITHOUT CONTRAST TECHNIQUE: Contiguous axial images were obtained from the base of the skull through the vertex without intravenous contrast. COMPARISON:  CT head without contrast 04/22/2017. FINDINGS: Brain: Moderate atrophy and white matter changes are stable bilaterally. Remote lacunar infarcts of the basal ganglia and internal capsule, right greater than left, are similar the prior study. No new infarcts are present. No acute hemorrhage or mass lesion is present. Vascular: Atherosclerotic calcifications are again noted in the cavernous internal carotid arteries bilaterally without a hyperdense vessel. Skull: Calvarium is intact. No focal lytic or blastic lesions are present. Sinuses/Orbits: Chronic right sphenoid sinus opacification is again noted. The remaining paranasal sinuses and the mastoid air cells are clear. Bilateral lens replacements are noted. Globes and orbits are within normal limits otherwise. ASPECTS [facility]([place] Stroke Program Early CT Score) - Ganglionic level infarction (caudate, lentiform nuclei, internal capsule, insula, M1-M3 cortex): 7/7 - Supraganglionic infarction (M4-M6 cortex): 3/3 Total score (0-10 with 10 being normal): 10/10 IMPRESSION: 1. No acute intracranial abnormality or significant interval change. 2. Stable moderate atrophy and white matter disease. This likely reflects the sequela of chronic microvascular ischemia. 3. Stable remote lacunar infarcts involving the basal ganglia and internal capsule,  right greater than left. 4. ASPECTS is 10/10 These results were called by telephone at the time of interpretation on 10/06/2017 at 12:31 pm to Dr. Willy EddyPATRICK ROBINSON , who verbally acknowledged these results. Electronically Signed   By: Marin Robertshristopher  Mattern M.D.   On: 10/06/2017 12:34    EKG: Orders placed or performed during the hospital encounter of 10/06/17  . ED EKG  . ED EKG  . EKG 12-Lead  . EKG 12-Lead    IMPRESSION AND PLAN: 1 acute CVA versus TIA Admit to regular nursing floor bed on our CVA protocol, add aspirin, on Eliquis, continue statin therapy, neurochecks per routine, neurology to see, unable to have MRI of the brain done, proceed with CT angiogram of the head/neck for further evaluation, repeat echocardiogram, speech therapy/physical therapy to evaluate/treat, aspiration/fall precautions while in house  2 history of atrial fibrillation Status post pacemaker placement Continue atenolol, Eliquis, cardia XT  3 history of congestive heart failure, systolic  dysfunction type Without exacerbation/stable On aspirin, Eliquis, statin therapy, hold Lasix for now given mild dehydration, losartan continued, repeat echocardiogram as stated above  4 chronic diabetes mellitus type 2 Stable Sliding scale insulin with Accu-Cheks per routine  5 chronic hyperlipidemia, unspecified Statin therapy-check lipids in the morning    All the records are reviewed and case discussed with ED provider. Management plans discussed with the patient, family and they are in agreement.  CODE STATUS:dnr Code Status History    Date Active Date Inactive Code Status Order ID Comments User Context   03/26/2017 1421 03/27/2017 2031  Full Code [id number]  Ramonita Lab, MD Inpatient   01/03/2015 0157 01/04/2015 1720 Full Code [id number]  Ron Parker, MD Inpatient       TOTAL TIME TAKING CARE OF THIS PATIENT: 45 minutes.    Evelena Asa Mckala Pantaleon M.D on 10/06/2017   Between 7am to 6pm - Pager -  [phone]  After 6pm go to [email or web address] - password [email or web address]  [facility]  Office  [phone]  CC: Primary care physician; Kandyce Rud, MD   Note: This dictation was prepared with Dragon dictation along with smaller phrase technology. Any transcriptional errors that result from this process are unintentional.

## 2017-10-06 NOTE — Progress Notes (Signed)
CODE STROKE- PHARMACY COMMUNICATION   Time CODE STROKE called/page received:12:20  Time response to CODE STROKE was made (in person or via phone): 12:25 via phone  Time Stroke Kit retrieved from [facility] (only if needed):N/A  Name of Provider/Nurse contacted: No answer  Patient on Eliquis at home. Pharm tech contacted and will complete med rec.    Past Medical History:  Diagnosis Date  . Arthritis   . CAD (coronary artery disease)   . CHF (congestive heart failure) (HCC)   . Diabetes mellitus without complication (HCC)   . High cholesterol   . Hypertension    Prior to Admission medications   Medication Sig Start Date End Date Taking? Authorizing Provider  acetaminophen (TYLENOL) 500 MG tablet Take 1,000 mg by mouth every 6 (six) hours as needed for mild pain.   Yes [provider]  albuterol (PROVENTIL HFA;VENTOLIN HFA) 108 (90 Base) MCG/ACT inhaler Inhale 2 puffs into the lungs every 6 (six) hours as needed for wheezing or shortness of breath. 10/27/15  Yes Taylor, Linda M, MD  apixaban (ELIQUIS) 5 MG TABS tablet Take 1 tablet (5 mg total) by mouth 2 (two) times daily. 01/04/15  Yes Tat, David, MD  atenolol (TENORMIN) 100 MG tablet Take 100 mg by mouth daily.  10/14/13  Yes [provider]  atorvastatin (LIPITOR) 40 MG tablet Take 1 tablet (40 mg total) by mouth at bedtime. 01/04/15  Yes Tat, David, MD  Calcium Carbonate-Vitamin D (CALCIUM 600+D) 600-400 MG-UNIT tablet Take 2 tablets by mouth 2 (two) times daily.   Yes [provider]  CARTIA XT 180 MG 24 hr capsule Take 180 mg by mouth daily.  04/05/17  Yes [provider]  Cholecalciferol (VITAMIN D PO) Take 1 tablet by mouth every morning.   Yes [provider]  DULoxetine (CYMBALTA) 60 MG capsule Take 60 mg by mouth daily.    Yes [provider]  fluticasone (FLONASE) 50 MCG/ACT nasal spray Place 2 sprays into both nostrils daily.   Yes [provider]  furosemide (LASIX)  20 MG tablet Take 20 mg by mouth daily. 07/14/17  Yes [provider]  losartan (COZAAR) 25 MG tablet Take 25 mg by mouth daily.   Yes [provider]  metFORMIN (GLUCOPHAGE-XR) 500 MG 24 hr tablet Take 500 mg by mouth daily with breakfast.  11/01/13  Yes [provider]  nitroGLYCERIN (NITROSTAT) 0.4 MG SL tablet Place 0.4 mg under the tongue every 5 (five) minutes as needed for chest pain.   Yes [provider]  traMADol (ULTRAM) 50 MG tablet Take 50 mg by mouth every 6 (six) hours as needed for moderate pain.   Yes [provider]  CALCIUM PO Take 2 tablets by mouth 2 (two) times daily.  10/06/17  [provider]  cycloSPORINE (RESTASIS MULTIDOSE) 0.05 % ophthalmic emulsion  04/30/16 10/06/17  [provider]    Stephanie Shuder ,PharmD Pharmacy Resident   10/06/2017  1:21 PM   

## 2017-10-07 DIAGNOSIS — E7800 Pure hypercholesterolemia, unspecified: Secondary | ICD-10-CM | POA: Diagnosis not present

## 2017-10-07 DIAGNOSIS — E119 Type 2 diabetes mellitus without complications: Secondary | ICD-10-CM | POA: Diagnosis not present

## 2017-10-07 DIAGNOSIS — G459 Transient cerebral ischemic attack, unspecified: Secondary | ICD-10-CM | POA: Diagnosis not present

## 2017-10-07 DIAGNOSIS — G8194 Hemiplegia, unspecified affecting left nondominant side: Secondary | ICD-10-CM | POA: Diagnosis not present

## 2017-10-07 DIAGNOSIS — I639 Cerebral infarction, unspecified: Principal | ICD-10-CM

## 2017-10-07 DIAGNOSIS — I110 Hypertensive heart disease with heart failure: Secondary | ICD-10-CM | POA: Diagnosis not present

## 2017-10-07 DIAGNOSIS — R531 Weakness: Secondary | ICD-10-CM | POA: Diagnosis not present

## 2017-10-07 DIAGNOSIS — Z66 Do not resuscitate: Secondary | ICD-10-CM | POA: Diagnosis not present

## 2017-10-07 DIAGNOSIS — I5022 Chronic systolic (congestive) heart failure: Secondary | ICD-10-CM | POA: Diagnosis not present

## 2017-10-07 DIAGNOSIS — I482 Chronic atrial fibrillation: Secondary | ICD-10-CM | POA: Diagnosis not present

## 2017-10-07 DIAGNOSIS — E785 Hyperlipidemia, unspecified: Secondary | ICD-10-CM | POA: Diagnosis not present

## 2017-10-07 DIAGNOSIS — I10 Essential (primary) hypertension: Secondary | ICD-10-CM | POA: Diagnosis not present

## 2017-10-07 DIAGNOSIS — I2510 Atherosclerotic heart disease of native coronary artery without angina pectoris: Secondary | ICD-10-CM | POA: Diagnosis not present

## 2017-10-07 LAB — LIPID PANEL
Cholesterol: 151 mg/dL (ref 0–200)
HDL: 43 mg/dL (ref >40)
LDL CALC: 80 mg/dL (ref 0–99)
TRIGLYCERIDES: 141 mg/dL (ref <150)
Total CHOL/HDL Ratio: 3.5 RATIO
VLDL: 28 mg/dL (ref 0–40)

## 2017-10-07 LAB — ECHOCARDIOGRAM COMPLETE
Height: 56 in
Weight: 2355.2 oz

## 2017-10-07 LAB — GLUCOSE, CAPILLARY
GLUCOSE-CAPILLARY: 141 mg/dL — AB (ref 65–99)
Glucose-Capillary: 196 mg/dL — ABNORMAL HIGH (ref 65–99)

## 2017-10-07 LAB — HEMOGLOBIN A1C
HEMOGLOBIN A1C: 6.9 % — AB (ref 4.8–5.6)
Mean Plasma Glucose: 151.33 mg/dL

## 2017-10-07 NOTE — Progress Notes (Signed)
Subjective: Patient out of bed in chair.  Speech at baseline but continues to have trouble with RUE strength.  Unable to feed herself.    Objective: Current vital signs: BP (!) 158/69   Pulse 70   Temp 98.1 F (36.7 C) (Oral)   Resp 17   Ht 4\' 8"  (1.422 m)   Wt 66.8 kg (147 lb 3.2 oz)   SpO2 100%   BMI 33.00 kg/m  Vital signs in last 24 hours: Temp:  [97.5 F (36.4 C)-98.7 F (37.1 C)] 98.1 F (36.7 C) (03/28 1225) Pulse Rate:  [60-78] 70 (03/28 1225) Resp:  [12-24] 17 (03/28 0430) BP: (126-176)/(58-84) 158/69 (03/28 1225) SpO2:  [93 %-100 %] 100 % (03/28 1225) Weight:  [66.8 kg (147 lb 3.2 oz)] 66.8 kg (147 lb 3.2 oz) (03/27 1517)  Intake/Output from previous day: 03/27 0701 - 03/28 0700 In: 973.3 [I.V.:973.3] Out: -  Intake/Output this shift: Total I/O In: 360 [P.O.:360] Out: -  Nutritional status: Fall precautions Aspiration precautions DIET SOFT Room service appropriate? Yes; Fluid consistency: Thin; Fluid restriction: 1500 mL Fluid  Neurologic Exam: Mental Status: Alert and awake.  Speech fluent without evidence of aphasia.  Mild dysarthria.  Able to follow 3 step commands with some reinforcement. Cranial Nerves: II: Discs flat bilaterally; Visual fields grossly normal, pupils equal, round, reactive to light and accommodation III,IV, VI: ptosis not present, extra-ocular motions intact bilaterally V,VII: mild decrease in the right NLF, facial light touch sensation normal bilaterally VIII: hearing normal bilaterally IX,X: gag reflex present XI: bilateral shoulder shrug XII: midline tongue extension Motor: Limited ROM in both upper extremities due to shoulder abnormalities.  Decreased RUE hand grip. Both upper extremities drift.     Lab Results: Basic Metabolic Panel: Recent Labs  Lab 10/06/17 1235  NA 136  K 4.0  CL 97*  CO2 26  GLUCOSE 228*  BUN 18  CREATININE 1.13*  CALCIUM 9.3    Liver Function Tests: Recent Labs  Lab 10/06/17 1235  AST 22   ALT 11*  ALKPHOS 123  BILITOT 0.6  PROT 7.6  ALBUMIN 3.7   No results for input(s): LIPASE, AMYLASE in the last 168 hours. No results for input(s): AMMONIA in the last 168 hours.  CBC: Recent Labs  Lab 10/06/17 1235  WBC 7.3  NEUTROABS 5.2  HGB 11.1*  HCT 33.6*  MCV 86.6  PLT 292    Cardiac Enzymes: Recent Labs  Lab 10/06/17 1235  TROPONINI <0.03    Lipid Panel: Recent Labs  Lab 10/07/17 0644  CHOL 151  TRIG 141  HDL 43  CHOLHDL 3.5  VLDL 28  LDLCALC 80    CBG: Recent Labs  Lab 10/06/17 1229 10/06/17 1718 10/06/17 2133 10/07/17 0751 10/07/17 1213  GLUCAP 216* 127* 207* 141* 196*    Microbiology: No results found for this or any previous visit.  Coagulation Studies: Recent Labs    10/06/17 1235  LABPROT 20.4*  INR 1.76    Imaging: Ct Angio Head W Or Wo Contrast  Result Date: 10/06/2017 CLINICAL DATA:  New onset of left arm weakness and slurred speech beginning this morning. Left facial droop. EXAM: CT ANGIOGRAPHY HEAD AND NECK TECHNIQUE: Multidetector CT imaging of the head and neck was performed using the standard protocol during bolus administration of intravenous contrast. Multiplanar CT image reconstructions and MIPs were obtained to evaluate the vascular anatomy. Carotid stenosis measurements (when applicable) are obtained utilizing NASCET criteria, using the distal internal carotid diameter as the denominator. CONTRAST:  60mL ISOVUE-370 IOPAMIDOL (ISOVUE-370) INJECTION 76% COMPARISON:  Head CT earlier same day. Previous CT angiography 01/03/2015. FINDINGS: CT HEAD FINDINGS Brain: No change. Mild generalized atrophy. Chronic small-vessel ischemic changes hemispheric white matter. Old lacunar infarctions of the basal ganglia. No acute infarctions seen. No hemorrhage, hydrocephalus or extra-axial collection. Vascular: There is atherosclerotic calcification of the major vessels at the base of the brain. Skull: Negative Sinuses: Chronic right ethmoid  and sphenoid sinusitis. Orbits: Normal Review of the MIP images confirms the above findings CTA NECK FINDINGS Aortic arch: Aortic atherosclerosis. No aneurysm or dissection. Branching pattern of the brachiocephalic vessels is normal without stenosis. Right carotid system: Common carotid artery widely patent to the bifurcation region. Mild atherosclerotic plaque at the carotid bifurcation but no stenosis or irregularity. Cervical ICA is markedly tortuous, extending to the midline behind the hypopharynx. Left carotid system: Common carotid artery widely patent to the bifurcation. No atherosclerotic narrowing or irregularity at the bifurcation. Cervical ICA is tortuous but widely patent. Vertebral arteries: Both vertebral artery origins appear widely patent. The vessels are quite tortuous but widely patent through the cervical region to the foramen magnum. Skeleton: Chronic cervical spondylosis and facet arthropathy. Other neck: No mass or lymphadenopathy. Upper chest: Negative Review of the MIP images confirms the above findings CTA HEAD FINDINGS Anterior circulation: Both internal carotid arteries are widely patent through the skull base and siphon regions. There is peripheral atherosclerotic calcification in the carotid siphons but no flow limiting stenosis. The anterior and middle cerebral vessels are patent without proximal stenosis, aneurysm or vascular malformation. No missing branch vessels are suspected. Posterior circulation: Both vertebral arteries widely patent through the foramen magnum. There are 50% stenoses of both distal vertebral arteries proximal to the basilar. No basilar stenosis. Posterior circulation branch vessels are patent. Distal PCA branches show some atherosclerotic irregularity. Venous sinuses: Patent and normal. Anatomic variants: None significant. Delayed phase: No abnormal brain enhancement. Review of the MIP images confirms the above findings IMPRESSION: No significant change since the  study of 2016. Vessels are quite tortuous as might be seen with a history of chronic hypertension. Aortic atherosclerosis without evidence of aneurysm or dissection. Minimal atherosclerotic change of the carotid bifurcations without stenosis or irregularity. 50% stenoses of both distal vertebral arteries just proximal to the basilar. Atherosclerotic narrowing and irregularity of the more distal PCA branches. Atherosclerotic calcification in the carotid siphon regions without stenosis of greater than 30% suspected. No flow limiting intracranial anterior circulation finding. No missing vessels suspected. Electronically Signed   By: Paulina Fusi M.D.   On: 10/06/2017 17:10   Ct Angio Neck W Or Wo Contrast  Result Date: 10/06/2017 CLINICAL DATA:  New onset of left arm weakness and slurred speech beginning this morning. Left facial droop. EXAM: CT ANGIOGRAPHY HEAD AND NECK TECHNIQUE: Multidetector CT imaging of the head and neck was performed using the standard protocol during bolus administration of intravenous contrast. Multiplanar CT image reconstructions and MIPs were obtained to evaluate the vascular anatomy. Carotid stenosis measurements (when applicable) are obtained utilizing NASCET criteria, using the distal internal carotid diameter as the denominator. CONTRAST:  60mL ISOVUE-370 IOPAMIDOL (ISOVUE-370) INJECTION 76% COMPARISON:  Head CT earlier same day. Previous CT angiography 01/03/2015. FINDINGS: CT HEAD FINDINGS Brain: No change. Mild generalized atrophy. Chronic small-vessel ischemic changes hemispheric white matter. Old lacunar infarctions of the basal ganglia. No acute infarctions seen. No hemorrhage, hydrocephalus or extra-axial collection. Vascular: There is atherosclerotic calcification of the major vessels at the base of the brain.  Skull: Negative Sinuses: Chronic right ethmoid and sphenoid sinusitis. Orbits: Normal Review of the MIP images confirms the above findings CTA NECK FINDINGS Aortic  arch: Aortic atherosclerosis. No aneurysm or dissection. Branching pattern of the brachiocephalic vessels is normal without stenosis. Right carotid system: Common carotid artery widely patent to the bifurcation region. Mild atherosclerotic plaque at the carotid bifurcation but no stenosis or irregularity. Cervical ICA is markedly tortuous, extending to the midline behind the hypopharynx. Left carotid system: Common carotid artery widely patent to the bifurcation. No atherosclerotic narrowing or irregularity at the bifurcation. Cervical ICA is tortuous but widely patent. Vertebral arteries: Both vertebral artery origins appear widely patent. The vessels are quite tortuous but widely patent through the cervical region to the foramen magnum. Skeleton: Chronic cervical spondylosis and facet arthropathy. Other neck: No mass or lymphadenopathy. Upper chest: Negative Review of the MIP images confirms the above findings CTA HEAD FINDINGS Anterior circulation: Both internal carotid arteries are widely patent through the skull base and siphon regions. There is peripheral atherosclerotic calcification in the carotid siphons but no flow limiting stenosis. The anterior and middle cerebral vessels are patent without proximal stenosis, aneurysm or vascular malformation. No missing branch vessels are suspected. Posterior circulation: Both vertebral arteries widely patent through the foramen magnum. There are 50% stenoses of both distal vertebral arteries proximal to the basilar. No basilar stenosis. Posterior circulation branch vessels are patent. Distal PCA branches show some atherosclerotic irregularity. Venous sinuses: Patent and normal. Anatomic variants: None significant. Delayed phase: No abnormal brain enhancement. Review of the MIP images confirms the above findings IMPRESSION: No significant change since the study of 2016. Vessels are quite tortuous as might be seen with a history of chronic hypertension. Aortic  atherosclerosis without evidence of aneurysm or dissection. Minimal atherosclerotic change of the carotid bifurcations without stenosis or irregularity. 50% stenoses of both distal vertebral arteries just proximal to the basilar. Atherosclerotic narrowing and irregularity of the more distal PCA branches. Atherosclerotic calcification in the carotid siphon regions without stenosis of greater than 30% suspected. No flow limiting intracranial anterior circulation finding. No missing vessels suspected. Electronically Signed   By: Paulina Fusi M.D.   On: 10/06/2017 17:10   Ct Head Code Stroke Wo Contrast  Result Date: 10/06/2017 CLINICAL DATA:  Code stroke. New onset of left arm weakness and slurred speech beginning at 9:30 a.m. today. Left-sided facial droop. EXAM: CT HEAD WITHOUT CONTRAST TECHNIQUE: Contiguous axial images were obtained from the base of the skull through the vertex without intravenous contrast. COMPARISON:  CT head without contrast 04/22/2017. FINDINGS: Brain: Moderate atrophy and white matter changes are stable bilaterally. Remote lacunar infarcts of the basal ganglia and internal capsule, right greater than left, are similar the prior study. No new infarcts are present. No acute hemorrhage or mass lesion is present. Vascular: Atherosclerotic calcifications are again noted in the cavernous internal carotid arteries bilaterally without a hyperdense vessel. Skull: Calvarium is intact. No focal lytic or blastic lesions are present. Sinuses/Orbits: Chronic right sphenoid sinus opacification is again noted. The remaining paranasal sinuses and the mastoid air cells are clear. Bilateral lens replacements are noted. Globes and orbits are within normal limits otherwise. ASPECTS [facility] Stroke Program Early CT Score) - Ganglionic level infarction (caudate, lentiform nuclei, internal capsule, insula, M1-M3 cortex): 7/7 - Supraganglionic infarction (M4-M6 cortex): 3/3 Total score (0-10 with 10 being normal):  10/10 IMPRESSION: 1. No acute intracranial abnormality or significant interval change. 2. Stable moderate atrophy and white matter disease. This likely  reflects the sequela of chronic microvascular ischemia. 3. Stable remote lacunar infarcts involving the basal ganglia and internal capsule, right greater than left. 4. ASPECTS is 10/10 These results were called by telephone at the time of interpretation on 10/06/2017 at 12:31 pm to Dr. Willy Eddy , who verbally acknowledged these results. Electronically Signed   By: Marin Roberts M.D.   On: 10/06/2017 12:34    Medications:  I have reviewed the patient's current medications. Scheduled: . apixaban  5 mg Oral BID  . aspirin EC  81 mg Oral Daily  . atenolol  100 mg Oral Daily  . atorvastatin  40 mg Oral QHS  . calcium-vitamin D  1 tablet Oral BID  . cholecalciferol  1,000 Units Oral BH-q7a  . diltiazem  180 mg Oral Daily  . DULoxetine  60 mg Oral Daily  . fluticasone  2 spray Each Nare Daily  . insulin aspart  0-15 Units Subcutaneous TID WC  . insulin aspart  0-5 Units Subcutaneous QHS  . losartan  25 mg Oral Daily    Assessment/Plan: Patient with continued RUE weakness.  Suspect embolic infarct.  Patient on Eliquis.  Echocardiogram shows an ef OF 55-60% with a calcified mitral valve.  LDL 80.  Family does not want progression in statins due to advanced age.  A1c pending.    Recommendations: 1.  Continue therapy 2.  Continue Eliquis   LOS: 1 day   Thana Farr, MD Neurology [phone] 10/07/2017  12:52 PM

## 2017-10-07 NOTE — Progress Notes (Signed)
PT Cancellation Note  Patient Details Name: Marcello Fennelrma R Posey MRN: [id number] DOB: March 05, 1924   Cancelled Treatment:    Reason Eval/Treat Not Completed: Patient declined, no reason specified.  Order received.  Chart reviewed.  Pt receiving breakfast and housekeeping entering room to clean simultaneously.  Pt requested that PT come back later.  PT will re-attempt at a later time.   Glenetta HewSarah Alasha Mcguinness, PT, DPT 10/07/2017, 8:32 AM

## 2017-10-07 NOTE — Progress Notes (Signed)
Discharge instructions given and went over with patient and patients daughter at bedside. All questions answered. Patient discharged home with daughter via wheelchair by volunteer services. Bo McclintockBrewer,Jaber Dunlow S, RN

## 2017-10-07 NOTE — Progress Notes (Signed)
Speech Therapy Note: reviewed chart notes; consulted NSG and pt/family. Pt working w/ PT and doing well following instructions. No decline in status overnight per family, staff. Pt is verbally communicating wants/needs functionally; no unilateral droop noted. Pt is taking time to process and respond verbally but [facility]; discussed the impact of any acute illness, change in environment, aging process and the overall impact on performance. Family agreed. No skilled ST services indicated. NSG to reconsult if any decline in status while admitted.    Jerilynn SomKatherine Alexandros Ewan, MS, CCC-SLP

## 2017-10-07 NOTE — Care Management Note (Signed)
Case Management Note  Patient Details  Name: Veronica Short MRN: [id number] Date of Birth: 23-Oct-1923  Subjective/Objective: Spoke with daughter, Veronica Short. Patient lives with this daughter. Prior to admission patient was ambulating small distances with her walker. She now needs assistance and is more limited. She has a bsc. Offered choice of home health agencies. Daughter prefers [facility],. They have used [facility] in the past. [facility]. Patient prefers a female HHPT. Jason updated. She will discharge home by car.  PCP is Dr. Larwance SachsBabaoff.                 Action/Plan: [facility] for HHPT, HHOT and HHA.   Expected Discharge Date:  10/07/17               Expected Discharge Plan:  Home w Home Health Services  In-House Referral:     Discharge planning Services  CM Consult  Post Acute Care Choice:  Home Health Choice offered to:  Adult Children  DME Arranged:    DME Agency:     HH Arranged:  PT, OT, Nurse's Aide HH Agency:  [facility]  Status of Service:  Completed, signed off  If discussed at Long Length of Stay Meetings, dates discussed:    Additional Comments:  Marily MemosLisa M Esra Frankowski, RN 10/07/2017, 11:33 AM

## 2017-10-07 NOTE — Evaluation (Signed)
Physical Therapy Evaluation Patient Details Name: Marcello Fennelrma R Eschmann MRN: [id number] DOB: 08-17-1923 Today's Date: 10/07/2017   History of Present Illness  Patient is a 84 year old female admitted from home for a CVA following report of slurred speech and L side weakness.  PMH includes htn, hypercholestrolemia, DM II, CHF, CAD and arthritis.  Clinical Impression  Pt is a 84 year old female who lives in a one story home with her daughter and son-in law.  Pt is dependent on a RW at baseline and is not generally active. Pt is oriented to self, location and situation and demonstrates a very mild delay when processing response to PT's questions.    Pt is in bed upon PT arrival and able to perform bed mobility Mod I.   She is able to sit at EOB without assistance from PT, BUE or BLE.  Pt presented with bilateral weakness of UE/LE, with L side being weaker than R.  Coordination testing revealed decreased coordination bilaterally with no significant difference between L and R side.  Pt is very petite and unable to reach the floor when sitting at EOB with bed at lowest level.  She will need a youth sized walker for this reason.  Pt initially required min A to stand from EOB but PT provided education concerning proper body mechanics and pt was able stand multiple times with CGA for safety purposes only following.  She was able to walk 5 ft along bedside but reported fatigue following, appearing SOB.  Pt presented with hypertension following and RN was notified.  Pt will continue to benefit from skilled PT with focus on strength, functional mobility, tolerance to activity, balance, coordination and safe use of RW.    Follow Up Recommendations Home health PT    Equipment Recommendations  Rolling walker with 5" wheels(Pt will need a pediatric/youth walker due to height.)    Recommendations for Other Services       Precautions / Restrictions Precautions Precautions: Fall Restrictions Weight Bearing  Restrictions: No      Mobility  Bed Mobility Overal bed mobility: Modified Independent             General bed mobility comments: Requires increased time and use of bed rail.  Able to sit at EOB without use of LE/UE for support  Transfers Overall transfer level: Needs assistance Equipment used: Rolling walker (2 wheeled)(Pediatric walker) Transfers: Sit to/from UGI CorporationStand;Stand Pivot Transfers Sit to Stand: Min guard Stand pivot transfers: Min guard       General transfer comment: Pt able to perform STS with CGA following PT education for body mechanics for safe transfer.  Pt performed three transfers between bed and chair and improved with each transfer.  BP: 136/104 following completion of transfers.  RN notified.  Ambulation/Gait Ambulation/Gait assistance: Min guard Ambulation Distance (Feet): 5 Feet       Gait velocity interpretation: Below normal speed for age/gender General Gait Details: Pt able to advance both LE's with low foot clearance to ambulate today which pt's daughter reports to be an improvement compared to yesterday.  Pt is able to back to chair while managing RW but requires assistance dur to catching wheel on corner of furniture occasionally.  Stairs            Wheelchair Mobility    Modified Rankin (Stroke Patients Only)       Balance Overall balance assessment: Needs assistance Sitting-balance support: No upper extremity supported       Standing balance support:  Bilateral upper extremity supported   Standing balance comment: Pt uses RW for balance and PT maintained CGA for safety.  Pt experienced no LOB's with static or dynamic stance.                             Pertinent Vitals/Pain Pain Assessment: No/denies pain    Home Living Family/patient expects to be discharged to:: Private residence Living Arrangements: Children(Daughter and son-in law) Available Help at Discharge: Family Type of Home: House Home Access: Ramped  entrance     Home Layout: One level Home Equipment: Environmental consultant - 4 wheels;Wheelchair - power Additional Comments: Pt's daughter reports that pt has a 4WW which is too tall for her.    Prior Function Level of Independence: Needs assistance   Gait / Transfers Assistance Needed: Family drives pt to hair appointment and she is able to walk into salon at baseline.  ADL's / Homemaking Assistance Needed: Pt able to perform ADL's independently.        Hand Dominance        Extremity/Trunk Assessment   Upper Extremity Assessment Upper Extremity Assessment: Generalized weakness(Pt presents with LUE weakness, roughly 3-/5 compared to RUE, roughly 3+/5.  R UE shoulder abduction ROM 45 degrees without pain.)    Lower Extremity Assessment Lower Extremity Assessment: Generalized weakness(LLE: grossly 3+/5, RLE: grossly 4-/5)    Cervical / Trunk Assessment Cervical / Trunk Assessment: Kyphotic  Communication      Cognition Arousal/Alertness: Awake/alert Behavior During Therapy: WFL for tasks assessed/performed Overall Cognitive Status: Impaired/Different from baseline Area of Impairment: Following commands                       Following Commands: Follows multi-step commands with increased time       General Comments: Pt is able to follow commands with very short delay in response.      General Comments General comments (skin integrity, edema, etc.): Coordination testing: Finger to nose: L UE: 12 sec with tremor, R UE: 11 sec with tremor    Exercises     Assessment/Plan    PT Assessment Patient needs continued PT services  PT Problem List Decreased strength;Decreased mobility;Decreased range of motion;Decreased activity tolerance;Decreased balance;Decreased knowledge of use of DME;Decreased cognition;Cardiopulmonary status limiting activity;Pain       PT Treatment Interventions DME instruction;Therapeutic activities;Cognitive remediation;Gait training;Therapeutic  exercise;Patient/family education;Stair training;Balance training;Wheelchair mobility training;Functional mobility training;Neuromuscular re-education;Manual techniques    PT Goals (Current goals can be found in the Care Plan section)  Acute Rehab PT Goals Patient Stated Goal: To return home and continue normal level of activity. PT Goal Formulation: With patient/family Time For Goal Achievement: 10/21/17 Potential to Achieve Goals: Good    Frequency 7X/week   Barriers to discharge        Co-evaluation               AM-PAC PT "6 Clicks" Daily Activity  Outcome Measure Difficulty turning over in bed (including adjusting bedclothes, sheets and blankets)?: A Little Difficulty moving from lying on back to sitting on the side of the bed? : A Little Difficulty sitting down on and standing up from a chair with arms (e.g., wheelchair, bedside commode, etc,.)?: A Little Help needed moving to and from a bed to chair (including a wheelchair)?: A Little Help needed walking in hospital room?: A Lot Help needed climbing 3-5 steps with a railing? : A Lot 6 Click Score: 16  End of Session Equipment Utilized During Treatment: Gait belt Activity Tolerance: Patient tolerated treatment well;Patient limited by fatigue Patient left: in chair;with call bell/phone within reach;with chair alarm set;with family/visitor present Nurse Communication: Mobility status(Pt hypertensive.) PT Visit Diagnosis: Unsteadiness on feet (R26.81);Muscle weakness (generalized) (M62.81);Hemiplegia and hemiparesis Hemiplegia - Right/Left: Left Hemiplegia - dominant/non-dominant: Non-dominant Hemiplegia - caused by: Cerebral infarction    Time: 1000-1040 PT Time Calculation (min) (ACUTE ONLY): 40 min   Charges:   PT Evaluation $PT Eval Low Complexity: 1 Low PT Treatments $Therapeutic Activity: 8-22 mins   PT G Codes:   PT G-Codes **NOT FOR INPATIENT CLASS** Functional Assessment Tool Used: AM-PAC 6 Clicks  Basic Mobility   Glenetta Hew, PT, DPT   Glenetta Hew 10/07/2017, 11:03 AM

## 2017-10-07 NOTE — Evaluation (Signed)
Occupational Therapy Evaluation Patient Details Name: Veronica Short MRN: [id number] DOB: 24-Jan-1924 Today's Date: 10/07/2017    History of Present Illness Patient is a 84 year old female admitted from home for a CVA following report of slurred speech and L side weakness.  PMH includes htn, hypercholestrolemia, DM II, CHF, CAD and arthritis.   Clinical Impression   Pt seen for OT evaluation this date. Pt lives in a single family home with ramped entrance, uses a rollator at baseline, several falls due to LOB, takes a seated sponge bath for safety, and generally able to perform basic self care tasks herself with additional time to complete. Daughter and son in law help provide 24/7 supervision/assist as needed. Daughter provides medication mgt/set up, meals, cleaning, and transportation. Pt with previous stroke with residual L side weakness and coordination deficits. Pt currently presents with baseline L sided strength/coordination deficits, impaired activity tolerance, and now with impaired strength, ROM, and coordination deficits in RUE impairing her ability to self feed, perform grooming tasks, bathing, and dressing at baseline modified independent level. OT trialed use of built up handles for spoon so pt could eat chocolate ice cream. With set up and supervision to CGA, pt able to scoop and bring spoon to mouth with minimal spillage. She fatigues quickly requiring min assist at times, improved performance when proximal RUE is supported with a pillow. Pt very excited she was able to feed herself and eager to show her daughters how she could eat. Pt/family instructed in modifications and use of built up utensils at home to support self-feeding and how to assist with pt becomes fatigued. Pt/family verbalized understanding. Pt will benefit from skilled OT services to address noted impairments and functional deficits in order to maximize return to PLOF and minimize future risk of falls, injury, and  rehospitalization. Recommend HHOT services and continued 24/7 supervision/assist provided by family.     Follow Up Recommendations  Home health OT;Supervision/Assistance - 24 hour    Equipment Recommendations  None recommended by OT    Recommendations for Other Services       Precautions / Restrictions Precautions Precautions: Fall Restrictions Weight Bearing Restrictions: No      Mobility Bed Mobility              General bed mobility comments: deferred, up in recliner  Transfers Overall transfer level: Needs assistance Equipment used: Rolling walker (2 wheeled)(pediatric size RW) Transfers: Sit to/from [employer] Sit to Stand: Min guard Stand pivot transfers: Min guard           Balance Overall balance assessment: Needs assistance Sitting-balance support: No upper extremity supported Sitting balance-Leahy Scale: Good     Standing balance support: Bilateral upper extremity supported Standing balance-Leahy Scale: Fair                            ADL either performed or assessed with clinical judgement   ADL Overall ADL's : Needs assistance/impaired Eating/Feeding: Sitting;Minimal assistance;Set up;With adaptive utensils Eating/Feeding Details (indicate cue type and reason): Pt trialed use of foam built up handles for plastic spoon and with set up assist to break up cold/hard ice cream, pt able to scoop and bring to her mouth with supervision to CGA increasing to min assist when pt fatigued after a few bites of ice cream. With pillow supported under RUE, pt noted with improved independence.  Grooming: Sitting;Set up;Min guard;Minimal assistance   Upper Body Bathing: Sitting;Moderate assistance;Minimal assistance  Lower Body Bathing: Sit to/from stand;Maximal assistance;Moderate assistance   Upper Body Dressing : Sitting;Moderate assistance;Minimal assistance   Lower Body Dressing: Moderate assistance;Maximal assistance;Sit  to/from stand   Toilet Transfer: Librarian, academic Details (indicate cue type and reason): pediatric height walker Toileting- Clothing Manipulation and Hygiene: Sit to/from stand;Moderate assistance       Functional mobility during ADLs: Min guard;Rolling walker       Vision Patient Visual Report: No change from baseline Vision Assessment?: No apparent visual deficits     Perception     Praxis      Pertinent Vitals/Pain Pain Assessment: No/denies pain     Hand Dominance Right   Extremity/Trunk Assessment Upper Extremity Assessment Upper Extremity Assessment: RUE deficits/detail;LUE deficits/detail RUE Deficits / Details: sensation intact, impaired coordination and strength, AROM shoulder abduction 0-45 degrees, AROM shoulder flexion 0-90, grossly 3+/5 t/o RUE except grip 3-/5 RUE Coordination: decreased gross motor;decreased fine motor LUE Deficits / Details: hx of weakness and decreased coordination at baseline from previous stroke; sensation intact, impaired coordination and strength, grossly 3-/5 t/o LUE Sensation: WNL LUE Coordination: decreased fine motor;decreased gross motor   Lower Extremity Assessment Lower Extremity Assessment: Defer to PT evaluation;Generalized weakness   Cervical / Trunk Assessment Cervical / Trunk Assessment: Kyphotic   Communication Communication Communication: No difficulties   Cognition Arousal/Alertness: Awake/alert Behavior During Therapy: WFL for tasks assessed/performed Overall Cognitive Status: Impaired/Different from baseline Area of Impairment: Following commands                       Following Commands: Follows multi-step commands with increased time       General Comments: Pt is able to follow commands with very short delay in response.   General Comments      Exercises Other Exercises Other Exercises: Pt/family instructed in adaptive equipment and strategies for self feeding and how to  position/prop up her RUE if she becomes fatigued to improve independence with continued task.  Other Exercises: Pt's dtr asked if she can still have bacon, OT educated family on what "soft diet" means based on the SLP's recommendation and risk of aspiration precaution   Shoulder Instructions      Home Living Family/patient expects to be discharged to:: Private residence Living Arrangements: Children(daughter and son in law) Available Help at Discharge: Family;Available 24 hours/day(someone is always with pt) Type of Home: House Home Access: Ramped entrance     Home Layout: One level     Bathroom Shower/Tub: Chief Strategy Officer: Standard Bathroom Accessibility: Yes How Accessible: Accessible via walker Home Equipment: Walker - 4 wheels;Wheelchair - power;Shower seat   Additional Comments: Pt's daughter reports that pt has a 4WW which is too tall for her.      Prior Functioning/Environment Level of Independence: Needs assistance  Gait / Transfers Assistance Needed: Family drives pt to hair appointment and she is able to walk into salon at baseline. ADL's / Homemaking Assistance Needed: Pt able to perform basic ADL independently, takes seated sponge bath and able to dress herself at modified independent level from seated position and additional time to perform; children assist with med mgt, provide meals, cleaning, and transportation   Comments: history of 2+ falls in past 6 months, dtr states that if pt lets go of the rollator she will lose her balance        OT Problem List: Decreased strength;Decreased knowledge of use of DME or AE;Decreased coordination;Impaired balance (sitting and/or standing);Impaired UE functional  use;Decreased activity tolerance      OT Treatment/Interventions: Self-care/ADL training;Therapeutic exercise;Therapeutic activities;DME and/or AE instruction;Patient/family education    OT Goals(Current goals can be found in the care plan  section) Acute Rehab OT Goals Patient Stated Goal: go home and return to PLOF and routine OT Goal Formulation: With patient/family Time For Goal Achievement: 10/14/17 Potential to Achieve Goals: Good ADL Goals Pt Will Perform Eating: with modified independence;with adaptive utensils;sitting(to complete at least 1/2 of meal herself, built up utensils) Pt Will Transfer to Toilet: with supervision;ambulating;regular height toilet(LRAD for amb)  OT Frequency: Min 2X/week   Barriers to D/C:            Co-evaluation              AM-PAC PT "6 Clicks" Daily Activity     Outcome Measure Help from another person eating meals?: A Little Help from another person taking care of personal grooming?: A Little Help from another person toileting, which includes using toliet, bedpan, or urinal?: A Little Help from another person bathing (including washing, rinsing, drying)?: A Little Help from another person to put on and taking off regular upper body clothing?: A Little Help from another person to put on and taking off regular lower body clothing?: A Lot 6 Click Score: 17   End of Session    Activity Tolerance: Patient tolerated treatment well Patient left: in chair;with call bell/phone within reach;with chair alarm set;with family/visitor present  OT Visit Diagnosis: Other abnormalities of gait and mobility (R26.89);Feeding difficulties (R63.3);Muscle weakness (generalized) (M62.81);History of falling (Z91.81);Ataxia, unspecified (R27.0)                Time: [id number] OT Time Calculation (min): 31 min Charges:  OT General Charges $OT Visit: 1 Visit OT Evaluation $OT Eval Low Complexity: 1 Low OT Treatments $Self Care/Home Management : 8-22 mins  Richrd PrimeJamie Short, MPH, MS, OTR/L ascom [phone] 10/07/17, 12:22 PM

## 2017-10-07 NOTE — Discharge Instructions (Signed)
Resume diet and activity as before ° ° °

## 2017-10-08 DIAGNOSIS — M6281 Muscle weakness (generalized): Secondary | ICD-10-CM | POA: Diagnosis not present

## 2017-10-08 DIAGNOSIS — I4891 Unspecified atrial fibrillation: Secondary | ICD-10-CM | POA: Diagnosis not present

## 2017-10-08 DIAGNOSIS — I69322 Dysarthria following cerebral infarction: Secondary | ICD-10-CM | POA: Diagnosis not present

## 2017-10-08 DIAGNOSIS — I5020 Unspecified systolic (congestive) heart failure: Secondary | ICD-10-CM | POA: Diagnosis not present

## 2017-10-08 DIAGNOSIS — I2510 Atherosclerotic heart disease of native coronary artery without angina pectoris: Secondary | ICD-10-CM | POA: Diagnosis not present

## 2017-10-08 DIAGNOSIS — M1990 Unspecified osteoarthritis, unspecified site: Secondary | ICD-10-CM | POA: Diagnosis not present

## 2017-10-08 DIAGNOSIS — E119 Type 2 diabetes mellitus without complications: Secondary | ICD-10-CM | POA: Diagnosis not present

## 2017-10-08 DIAGNOSIS — I69398 Other sequelae of cerebral infarction: Secondary | ICD-10-CM | POA: Diagnosis not present

## 2017-10-08 DIAGNOSIS — I110 Hypertensive heart disease with heart failure: Secondary | ICD-10-CM | POA: Diagnosis not present

## 2017-10-08 NOTE — Discharge Summary (Signed)
[facility] [place] [facility]   PATIENT NAME: Veronica Short    MR#:  [id number]  DATE OF BIRTH:  12-09-1923  DATE OF ADMISSION:  10/06/2017 ADMITTING PHYSICIAN: Bertrum Sol, MD  DATE OF DISCHARGE: 10/07/2017  2:38 PM  PRIMARY CARE PHYSICIAN: Kandyce Rud, MD   ADMISSION DIAGNOSIS:  Cerebrovascular accident (CVA), unspecified mechanism (HCC) [I63.9]  DISCHARGE DIAGNOSIS:  Active Problems:   CVA (cerebral vascular accident) (HCC)   SECONDARY DIAGNOSIS:   Past Medical History:  Diagnosis Date  . Arthritis   . CAD (coronary artery disease)   . CHF (congestive heart failure) (HCC)   . Diabetes mellitus without complication (HCC)   . High cholesterol   . Hypertension      ADMITTING HISTORY  HISTORY OF PRESENT ILLNESS: Veronica Short  is a 84 y.o. female with a known history per below last known normal last night, woke at 9:30 AM with left-sided weakness, slurred speech, thick speech, noted facial droop this afternoon by daughter, brought to the emergency room for further evaluation/care, patient on Eliquis for A. fib chronically, INR was 1.7, CT head was negative, glucose 228, rest of her investigations were unimpressive, patient evaluated the bedside, daughter at the bedside, no apparent distress, resting comfortably in bed, no facial droop noted, patient is now been admitted for acute possible CVA versus TIA.    HOSPITAL COURSE:   * Acute CVA likely due to Afib Already on eliquis Patient was admitted to medical floor with telemetry monitoring.  Neurochecks continued.  Echocardiogram and carotid Doppler showed nothing acute. Discussed with neurology Dr. Thad Ranger.  Suggested continuing Eliquis.  Seen by physical therapy and occupational therapy.  Home health set up at discharge.  Patient's other comorbidities remained stable during the hospital stay.  CONSULTS OBTAINED:  Treatment Team:  Kym Groom, MD Thana Farr, MD  DRUG  ALLERGIES:   Allergies  Allergen Reactions  . Morphine Anxiety    NUMBNESS  . Penicillins Hives and Rash    Has patient had a PCN reaction causing immediate rash, facial/tongue/throat swelling, SOB or lightheadedness with hypotension: No Has patient had a PCN reaction causing severe rash involving mucus membranes or skin necrosis: No Has patient had a PCN reaction that required hospitalization: No Has patient had a PCN reaction occurring within the last 10 years: No If all of the above answers are "NO", then may proceed with Cephalosporin use.     DISCHARGE MEDICATIONS:   Allergies as of 10/07/2017      Reactions   Morphine Anxiety   NUMBNESS   Penicillins Hives, Rash   Has patient had a PCN reaction causing immediate rash, facial/tongue/throat swelling, SOB or lightheadedness with hypotension: No Has patient had a PCN reaction causing severe rash involving mucus membranes or skin necrosis: No Has patient had a PCN reaction that required hospitalization: No Has patient had a PCN reaction occurring within the last 10 years: No If all of the above answers are "NO", then may proceed with Cephalosporin use.      Medication List    TAKE these medications   acetaminophen 500 MG tablet Commonly known as:  TYLENOL Take 1,000 mg by mouth every 6 (six) hours as needed for mild pain.   albuterol 108 (90 Base) MCG/ACT inhaler Commonly known as:  PROVENTIL HFA;VENTOLIN HFA Inhale 2 puffs into the lungs every 6 (six) hours as needed for wheezing or shortness of breath.   apixaban 5 MG Tabs tablet Commonly known as:  ELIQUIS  Take 1 tablet (5 mg total) by mouth 2 (two) times daily.   atenolol 100 MG tablet Commonly known as:  TENORMIN Take 100 mg by mouth daily.   atorvastatin 40 MG tablet Commonly known as:  LIPITOR Take 1 tablet (40 mg total) by mouth at bedtime.   CALCIUM 600+D 600-400 MG-UNIT tablet Generic drug:  Calcium Carbonate-Vitamin D Take 2 tablets by mouth 2 (two)  times daily.   CARTIA XT 180 MG 24 hr capsule Generic drug:  diltiazem Take 180 mg by mouth daily.   DULoxetine 60 MG capsule Commonly known as:  CYMBALTA Take 60 mg by mouth daily.   fluticasone 50 MCG/ACT nasal spray Commonly known as:  FLONASE Place 2 sprays into both nostrils daily.   furosemide 20 MG tablet Commonly known as:  LASIX Take 20 mg by mouth daily.   losartan 25 MG tablet Commonly known as:  COZAAR Take 25 mg by mouth daily.   metFORMIN 500 MG 24 hr tablet Commonly known as:  GLUCOPHAGE-XR Take 500 mg by mouth daily with breakfast.   nitroGLYCERIN 0.4 MG SL tablet Commonly known as:  NITROSTAT Place 0.4 mg under the tongue every 5 (five) minutes as needed for chest pain.   traMADol 50 MG tablet Commonly known as:  ULTRAM Take 50 mg by mouth every 6 (six) hours as needed for moderate pain.   VITAMIN D PO Take 1 tablet by mouth every morning.       Today   VITAL SIGNS:  Blood pressure (!) 158/69, pulse 70, temperature 98.1 F (36.7 C), temperature source Oral, resp. rate 17, height 4\' 8"  (1.422 m), weight 66.8 kg (147 lb 3.2 oz), SpO2 100 %.  I/O:  No intake or output data in the 24 hours ending 10/08/17 1926  PHYSICAL EXAMINATION:  Physical Exam  GENERAL:  84 y.o.-year-old patient lying in the bed with no acute distress.  LUNGS: Normal breath sounds bilaterally, no wheezing, rales,rhonchi or crepitation. No use of accessory muscles of respiration.  CARDIOVASCULAR: S1, S2 normal. No murmurs, rubs, or gallops.  ABDOMEN: Soft, non-tender, non-distended. Bowel sounds present. No organomegaly or mass.  NEUROLOGIC: Moves all 4 extremities. PSYCHIATRIC: The patient is alert and oriented x 3.  SKIN: No obvious rash, lesion, or ulcer.   DATA REVIEW:   CBC Recent Labs  Lab 10/06/17 1235  WBC 7.3  HGB 11.1*  HCT 33.6*  PLT 292    Chemistries  Recent Labs  Lab 10/06/17 1235  NA 136  K 4.0  CL 97*  CO2 26  GLUCOSE 228*  BUN 18   CREATININE 1.13*  CALCIUM 9.3  AST 22  ALT 11*  ALKPHOS 123  BILITOT 0.6    Cardiac Enzymes Recent Labs  Lab 10/06/17 1235  TROPONINI <0.03    Microbiology Results  No results found for this or any previous visit.  RADIOLOGY:  No results found.  Follow up with PCP in 1 week.  Management plans discussed with the patient, family and they are in agreement.  CODE STATUS:  Code Status History    Date Active Date Inactive Code Status Order ID Comments User Context   10/06/2017 1558 10/07/2017 1838 DNR [id number]  Bertrum SolSalary, Montell D, MD Inpatient   03/26/2017 1421 03/27/2017 2031 Full Code [id number]  Ramonita LabGouru, Aruna, MD Inpatient   01/03/2015 0157 01/04/2015 1720 Full Code [id number]  Ron ParkerJenkins, Harvette C, MD Inpatient    Questions for Most Recent Historical Code Status (Order [id number])    Question Answer Comment  In the event of cardiac or respiratory ARREST Do not call a "code blue"    In the event of cardiac or respiratory ARREST Do not perform Intubation, CPR, defibrillation or ACLS    In the event of cardiac or respiratory ARREST Use medication by any route, position, wound care, and other measures to relive pain and suffering. May use oxygen, suction and manual treatment of airway obstruction as needed for comfort.       TOTAL TIME TAKING CARE OF THIS PATIENT ON DAY OF DISCHARGE: more than 30 minutes.   Molinda Bailiff Sharmon Cheramie M.D on 10/08/2017 at 7:26 PM  Between 7am to 6pm - Pager - [phone]  After 6pm go to [email or web address] - password [email or web address] [facility]  [facility]  Office  [phone]  CC: Primary care physician; Kandyce Rud, MD  Note: This dictation was prepared with Dragon dictation along with smaller phrase technology. Any transcriptional errors that result from this process are unintentional.

## 2017-10-11 DIAGNOSIS — I4891 Unspecified atrial fibrillation: Secondary | ICD-10-CM | POA: Diagnosis not present

## 2017-10-11 DIAGNOSIS — I110 Hypertensive heart disease with heart failure: Secondary | ICD-10-CM | POA: Diagnosis not present

## 2017-10-11 DIAGNOSIS — I69322 Dysarthria following cerebral infarction: Secondary | ICD-10-CM | POA: Diagnosis not present

## 2017-10-11 DIAGNOSIS — M6281 Muscle weakness (generalized): Secondary | ICD-10-CM | POA: Diagnosis not present

## 2017-10-11 DIAGNOSIS — M1990 Unspecified osteoarthritis, unspecified site: Secondary | ICD-10-CM | POA: Diagnosis not present

## 2017-10-11 DIAGNOSIS — E119 Type 2 diabetes mellitus without complications: Secondary | ICD-10-CM | POA: Diagnosis not present

## 2017-10-11 DIAGNOSIS — I2510 Atherosclerotic heart disease of native coronary artery without angina pectoris: Secondary | ICD-10-CM | POA: Diagnosis not present

## 2017-10-11 DIAGNOSIS — I5020 Unspecified systolic (congestive) heart failure: Secondary | ICD-10-CM | POA: Diagnosis not present

## 2017-10-11 DIAGNOSIS — I69398 Other sequelae of cerebral infarction: Secondary | ICD-10-CM | POA: Diagnosis not present

## 2017-10-12 DIAGNOSIS — E119 Type 2 diabetes mellitus without complications: Secondary | ICD-10-CM | POA: Diagnosis not present

## 2017-10-12 DIAGNOSIS — I69322 Dysarthria following cerebral infarction: Secondary | ICD-10-CM | POA: Diagnosis not present

## 2017-10-12 DIAGNOSIS — M1990 Unspecified osteoarthritis, unspecified site: Secondary | ICD-10-CM | POA: Diagnosis not present

## 2017-10-12 DIAGNOSIS — I4891 Unspecified atrial fibrillation: Secondary | ICD-10-CM | POA: Diagnosis not present

## 2017-10-12 DIAGNOSIS — I110 Hypertensive heart disease with heart failure: Secondary | ICD-10-CM | POA: Diagnosis not present

## 2017-10-12 DIAGNOSIS — I69398 Other sequelae of cerebral infarction: Secondary | ICD-10-CM | POA: Diagnosis not present

## 2017-10-12 DIAGNOSIS — I5020 Unspecified systolic (congestive) heart failure: Secondary | ICD-10-CM | POA: Diagnosis not present

## 2017-10-12 DIAGNOSIS — I2510 Atherosclerotic heart disease of native coronary artery without angina pectoris: Secondary | ICD-10-CM | POA: Diagnosis not present

## 2017-10-12 DIAGNOSIS — M6281 Muscle weakness (generalized): Secondary | ICD-10-CM | POA: Diagnosis not present

## 2017-10-13 DIAGNOSIS — I110 Hypertensive heart disease with heart failure: Secondary | ICD-10-CM | POA: Diagnosis not present

## 2017-10-13 DIAGNOSIS — M1990 Unspecified osteoarthritis, unspecified site: Secondary | ICD-10-CM | POA: Diagnosis not present

## 2017-10-13 DIAGNOSIS — M6281 Muscle weakness (generalized): Secondary | ICD-10-CM | POA: Diagnosis not present

## 2017-10-13 DIAGNOSIS — I5020 Unspecified systolic (congestive) heart failure: Secondary | ICD-10-CM | POA: Diagnosis not present

## 2017-10-13 DIAGNOSIS — I4891 Unspecified atrial fibrillation: Secondary | ICD-10-CM | POA: Diagnosis not present

## 2017-10-13 DIAGNOSIS — I2510 Atherosclerotic heart disease of native coronary artery without angina pectoris: Secondary | ICD-10-CM | POA: Diagnosis not present

## 2017-10-13 DIAGNOSIS — I69398 Other sequelae of cerebral infarction: Secondary | ICD-10-CM | POA: Diagnosis not present

## 2017-10-13 DIAGNOSIS — E119 Type 2 diabetes mellitus without complications: Secondary | ICD-10-CM | POA: Diagnosis not present

## 2017-10-13 DIAGNOSIS — I69322 Dysarthria following cerebral infarction: Secondary | ICD-10-CM | POA: Diagnosis not present

## 2017-10-14 DIAGNOSIS — I69351 Hemiplegia and hemiparesis following cerebral infarction affecting right dominant side: Secondary | ICD-10-CM | POA: Diagnosis not present

## 2017-10-15 DIAGNOSIS — I5020 Unspecified systolic (congestive) heart failure: Secondary | ICD-10-CM | POA: Diagnosis not present

## 2017-10-15 DIAGNOSIS — I4891 Unspecified atrial fibrillation: Secondary | ICD-10-CM | POA: Diagnosis not present

## 2017-10-15 DIAGNOSIS — I2510 Atherosclerotic heart disease of native coronary artery without angina pectoris: Secondary | ICD-10-CM | POA: Diagnosis not present

## 2017-10-15 DIAGNOSIS — I110 Hypertensive heart disease with heart failure: Secondary | ICD-10-CM | POA: Diagnosis not present

## 2017-10-15 DIAGNOSIS — M6281 Muscle weakness (generalized): Secondary | ICD-10-CM | POA: Diagnosis not present

## 2017-10-15 DIAGNOSIS — E119 Type 2 diabetes mellitus without complications: Secondary | ICD-10-CM | POA: Diagnosis not present

## 2017-10-15 DIAGNOSIS — I69322 Dysarthria following cerebral infarction: Secondary | ICD-10-CM | POA: Diagnosis not present

## 2017-10-15 DIAGNOSIS — M1990 Unspecified osteoarthritis, unspecified site: Secondary | ICD-10-CM | POA: Diagnosis not present

## 2017-10-15 DIAGNOSIS — I69398 Other sequelae of cerebral infarction: Secondary | ICD-10-CM | POA: Diagnosis not present

## 2017-10-18 DIAGNOSIS — I69322 Dysarthria following cerebral infarction: Secondary | ICD-10-CM | POA: Diagnosis not present

## 2017-10-18 DIAGNOSIS — E119 Type 2 diabetes mellitus without complications: Secondary | ICD-10-CM | POA: Diagnosis not present

## 2017-10-18 DIAGNOSIS — M6281 Muscle weakness (generalized): Secondary | ICD-10-CM | POA: Diagnosis not present

## 2017-10-18 DIAGNOSIS — I2510 Atherosclerotic heart disease of native coronary artery without angina pectoris: Secondary | ICD-10-CM | POA: Diagnosis not present

## 2017-10-18 DIAGNOSIS — I69398 Other sequelae of cerebral infarction: Secondary | ICD-10-CM | POA: Diagnosis not present

## 2017-10-18 DIAGNOSIS — I110 Hypertensive heart disease with heart failure: Secondary | ICD-10-CM | POA: Diagnosis not present

## 2017-10-18 DIAGNOSIS — I5020 Unspecified systolic (congestive) heart failure: Secondary | ICD-10-CM | POA: Diagnosis not present

## 2017-10-18 DIAGNOSIS — I4891 Unspecified atrial fibrillation: Secondary | ICD-10-CM | POA: Diagnosis not present

## 2017-10-18 DIAGNOSIS — M1990 Unspecified osteoarthritis, unspecified site: Secondary | ICD-10-CM | POA: Diagnosis not present

## 2017-10-19 DIAGNOSIS — I4891 Unspecified atrial fibrillation: Secondary | ICD-10-CM | POA: Diagnosis not present

## 2017-10-19 DIAGNOSIS — I69322 Dysarthria following cerebral infarction: Secondary | ICD-10-CM | POA: Diagnosis not present

## 2017-10-19 DIAGNOSIS — E119 Type 2 diabetes mellitus without complications: Secondary | ICD-10-CM | POA: Diagnosis not present

## 2017-10-19 DIAGNOSIS — I5020 Unspecified systolic (congestive) heart failure: Secondary | ICD-10-CM | POA: Diagnosis not present

## 2017-10-19 DIAGNOSIS — I110 Hypertensive heart disease with heart failure: Secondary | ICD-10-CM | POA: Diagnosis not present

## 2017-10-19 DIAGNOSIS — I69398 Other sequelae of cerebral infarction: Secondary | ICD-10-CM | POA: Diagnosis not present

## 2017-10-19 DIAGNOSIS — M6281 Muscle weakness (generalized): Secondary | ICD-10-CM | POA: Diagnosis not present

## 2017-10-19 DIAGNOSIS — M1990 Unspecified osteoarthritis, unspecified site: Secondary | ICD-10-CM | POA: Diagnosis not present

## 2017-10-19 DIAGNOSIS — I2510 Atherosclerotic heart disease of native coronary artery without angina pectoris: Secondary | ICD-10-CM | POA: Diagnosis not present

## 2017-10-20 DIAGNOSIS — I69322 Dysarthria following cerebral infarction: Secondary | ICD-10-CM | POA: Diagnosis not present

## 2017-10-20 DIAGNOSIS — I5020 Unspecified systolic (congestive) heart failure: Secondary | ICD-10-CM | POA: Diagnosis not present

## 2017-10-20 DIAGNOSIS — I4891 Unspecified atrial fibrillation: Secondary | ICD-10-CM | POA: Diagnosis not present

## 2017-10-20 DIAGNOSIS — I110 Hypertensive heart disease with heart failure: Secondary | ICD-10-CM | POA: Diagnosis not present

## 2017-10-20 DIAGNOSIS — E119 Type 2 diabetes mellitus without complications: Secondary | ICD-10-CM | POA: Diagnosis not present

## 2017-10-20 DIAGNOSIS — M1990 Unspecified osteoarthritis, unspecified site: Secondary | ICD-10-CM | POA: Diagnosis not present

## 2017-10-20 DIAGNOSIS — M6281 Muscle weakness (generalized): Secondary | ICD-10-CM | POA: Diagnosis not present

## 2017-10-20 DIAGNOSIS — I2510 Atherosclerotic heart disease of native coronary artery without angina pectoris: Secondary | ICD-10-CM | POA: Diagnosis not present

## 2017-10-20 DIAGNOSIS — I69398 Other sequelae of cerebral infarction: Secondary | ICD-10-CM | POA: Diagnosis not present

## 2017-10-21 DIAGNOSIS — E119 Type 2 diabetes mellitus without complications: Secondary | ICD-10-CM | POA: Diagnosis not present

## 2017-10-21 DIAGNOSIS — I69398 Other sequelae of cerebral infarction: Secondary | ICD-10-CM | POA: Diagnosis not present

## 2017-10-21 DIAGNOSIS — I69322 Dysarthria following cerebral infarction: Secondary | ICD-10-CM | POA: Diagnosis not present

## 2017-10-21 DIAGNOSIS — I110 Hypertensive heart disease with heart failure: Secondary | ICD-10-CM | POA: Diagnosis not present

## 2017-10-21 DIAGNOSIS — I5020 Unspecified systolic (congestive) heart failure: Secondary | ICD-10-CM | POA: Diagnosis not present

## 2017-10-21 DIAGNOSIS — I4891 Unspecified atrial fibrillation: Secondary | ICD-10-CM | POA: Diagnosis not present

## 2017-10-21 DIAGNOSIS — I2510 Atherosclerotic heart disease of native coronary artery without angina pectoris: Secondary | ICD-10-CM | POA: Diagnosis not present

## 2017-10-21 DIAGNOSIS — M6281 Muscle weakness (generalized): Secondary | ICD-10-CM | POA: Diagnosis not present

## 2017-10-21 DIAGNOSIS — M1990 Unspecified osteoarthritis, unspecified site: Secondary | ICD-10-CM | POA: Diagnosis not present

## 2017-10-22 DIAGNOSIS — I69398 Other sequelae of cerebral infarction: Secondary | ICD-10-CM | POA: Diagnosis not present

## 2017-10-22 DIAGNOSIS — I69322 Dysarthria following cerebral infarction: Secondary | ICD-10-CM | POA: Diagnosis not present

## 2017-10-22 DIAGNOSIS — M1990 Unspecified osteoarthritis, unspecified site: Secondary | ICD-10-CM | POA: Diagnosis not present

## 2017-10-22 DIAGNOSIS — I2510 Atherosclerotic heart disease of native coronary artery without angina pectoris: Secondary | ICD-10-CM | POA: Diagnosis not present

## 2017-10-22 DIAGNOSIS — I110 Hypertensive heart disease with heart failure: Secondary | ICD-10-CM | POA: Diagnosis not present

## 2017-10-22 DIAGNOSIS — I4891 Unspecified atrial fibrillation: Secondary | ICD-10-CM | POA: Diagnosis not present

## 2017-10-22 DIAGNOSIS — I5020 Unspecified systolic (congestive) heart failure: Secondary | ICD-10-CM | POA: Diagnosis not present

## 2017-10-22 DIAGNOSIS — E119 Type 2 diabetes mellitus without complications: Secondary | ICD-10-CM | POA: Diagnosis not present

## 2017-10-22 DIAGNOSIS — M6281 Muscle weakness (generalized): Secondary | ICD-10-CM | POA: Diagnosis not present

## 2017-10-25 DIAGNOSIS — I4891 Unspecified atrial fibrillation: Secondary | ICD-10-CM | POA: Diagnosis not present

## 2017-10-25 DIAGNOSIS — I69322 Dysarthria following cerebral infarction: Secondary | ICD-10-CM | POA: Diagnosis not present

## 2017-10-25 DIAGNOSIS — I69398 Other sequelae of cerebral infarction: Secondary | ICD-10-CM | POA: Diagnosis not present

## 2017-10-25 DIAGNOSIS — I5020 Unspecified systolic (congestive) heart failure: Secondary | ICD-10-CM | POA: Diagnosis not present

## 2017-10-25 DIAGNOSIS — M6281 Muscle weakness (generalized): Secondary | ICD-10-CM | POA: Diagnosis not present

## 2017-10-25 DIAGNOSIS — E119 Type 2 diabetes mellitus without complications: Secondary | ICD-10-CM | POA: Diagnosis not present

## 2017-10-25 DIAGNOSIS — I2510 Atherosclerotic heart disease of native coronary artery without angina pectoris: Secondary | ICD-10-CM | POA: Diagnosis not present

## 2017-10-25 DIAGNOSIS — I110 Hypertensive heart disease with heart failure: Secondary | ICD-10-CM | POA: Diagnosis not present

## 2017-10-25 DIAGNOSIS — M1990 Unspecified osteoarthritis, unspecified site: Secondary | ICD-10-CM | POA: Diagnosis not present

## 2017-10-26 DIAGNOSIS — I110 Hypertensive heart disease with heart failure: Secondary | ICD-10-CM | POA: Diagnosis not present

## 2017-10-26 DIAGNOSIS — I5020 Unspecified systolic (congestive) heart failure: Secondary | ICD-10-CM | POA: Diagnosis not present

## 2017-10-26 DIAGNOSIS — I69322 Dysarthria following cerebral infarction: Secondary | ICD-10-CM | POA: Diagnosis not present

## 2017-10-26 DIAGNOSIS — I4891 Unspecified atrial fibrillation: Secondary | ICD-10-CM | POA: Diagnosis not present

## 2017-10-26 DIAGNOSIS — I2510 Atherosclerotic heart disease of native coronary artery without angina pectoris: Secondary | ICD-10-CM | POA: Diagnosis not present

## 2017-10-26 DIAGNOSIS — E119 Type 2 diabetes mellitus without complications: Secondary | ICD-10-CM | POA: Diagnosis not present

## 2017-10-26 DIAGNOSIS — M6281 Muscle weakness (generalized): Secondary | ICD-10-CM | POA: Diagnosis not present

## 2017-10-26 DIAGNOSIS — I69398 Other sequelae of cerebral infarction: Secondary | ICD-10-CM | POA: Diagnosis not present

## 2017-10-26 DIAGNOSIS — M1990 Unspecified osteoarthritis, unspecified site: Secondary | ICD-10-CM | POA: Diagnosis not present

## 2017-10-27 DIAGNOSIS — M1990 Unspecified osteoarthritis, unspecified site: Secondary | ICD-10-CM | POA: Diagnosis not present

## 2017-10-27 DIAGNOSIS — I4891 Unspecified atrial fibrillation: Secondary | ICD-10-CM | POA: Diagnosis not present

## 2017-10-27 DIAGNOSIS — I110 Hypertensive heart disease with heart failure: Secondary | ICD-10-CM | POA: Diagnosis not present

## 2017-10-27 DIAGNOSIS — M6281 Muscle weakness (generalized): Secondary | ICD-10-CM | POA: Diagnosis not present

## 2017-10-27 DIAGNOSIS — E119 Type 2 diabetes mellitus without complications: Secondary | ICD-10-CM | POA: Diagnosis not present

## 2017-10-27 DIAGNOSIS — I2510 Atherosclerotic heart disease of native coronary artery without angina pectoris: Secondary | ICD-10-CM | POA: Diagnosis not present

## 2017-10-27 DIAGNOSIS — I5020 Unspecified systolic (congestive) heart failure: Secondary | ICD-10-CM | POA: Diagnosis not present

## 2017-10-27 DIAGNOSIS — I69398 Other sequelae of cerebral infarction: Secondary | ICD-10-CM | POA: Diagnosis not present

## 2017-10-27 DIAGNOSIS — I69322 Dysarthria following cerebral infarction: Secondary | ICD-10-CM | POA: Diagnosis not present

## 2017-10-28 DIAGNOSIS — I69322 Dysarthria following cerebral infarction: Secondary | ICD-10-CM | POA: Diagnosis not present

## 2017-10-28 DIAGNOSIS — I2510 Atherosclerotic heart disease of native coronary artery without angina pectoris: Secondary | ICD-10-CM | POA: Diagnosis not present

## 2017-10-28 DIAGNOSIS — M1990 Unspecified osteoarthritis, unspecified site: Secondary | ICD-10-CM | POA: Diagnosis not present

## 2017-10-28 DIAGNOSIS — M6281 Muscle weakness (generalized): Secondary | ICD-10-CM | POA: Diagnosis not present

## 2017-10-28 DIAGNOSIS — I110 Hypertensive heart disease with heart failure: Secondary | ICD-10-CM | POA: Diagnosis not present

## 2017-10-28 DIAGNOSIS — I5020 Unspecified systolic (congestive) heart failure: Secondary | ICD-10-CM | POA: Diagnosis not present

## 2017-10-28 DIAGNOSIS — I4891 Unspecified atrial fibrillation: Secondary | ICD-10-CM | POA: Diagnosis not present

## 2017-10-28 DIAGNOSIS — E119 Type 2 diabetes mellitus without complications: Secondary | ICD-10-CM | POA: Diagnosis not present

## 2017-10-28 DIAGNOSIS — I69398 Other sequelae of cerebral infarction: Secondary | ICD-10-CM | POA: Diagnosis not present

## 2017-11-01 DIAGNOSIS — I110 Hypertensive heart disease with heart failure: Secondary | ICD-10-CM | POA: Diagnosis not present

## 2017-11-01 DIAGNOSIS — M6281 Muscle weakness (generalized): Secondary | ICD-10-CM | POA: Diagnosis not present

## 2017-11-01 DIAGNOSIS — I69398 Other sequelae of cerebral infarction: Secondary | ICD-10-CM | POA: Diagnosis not present

## 2017-11-01 DIAGNOSIS — I2510 Atherosclerotic heart disease of native coronary artery without angina pectoris: Secondary | ICD-10-CM | POA: Diagnosis not present

## 2017-11-01 DIAGNOSIS — I69322 Dysarthria following cerebral infarction: Secondary | ICD-10-CM | POA: Diagnosis not present

## 2017-11-01 DIAGNOSIS — I4891 Unspecified atrial fibrillation: Secondary | ICD-10-CM | POA: Diagnosis not present

## 2017-11-01 DIAGNOSIS — E119 Type 2 diabetes mellitus without complications: Secondary | ICD-10-CM | POA: Diagnosis not present

## 2017-11-01 DIAGNOSIS — I5020 Unspecified systolic (congestive) heart failure: Secondary | ICD-10-CM | POA: Diagnosis not present

## 2017-11-01 DIAGNOSIS — M1990 Unspecified osteoarthritis, unspecified site: Secondary | ICD-10-CM | POA: Diagnosis not present

## 2017-11-03 DIAGNOSIS — I2510 Atherosclerotic heart disease of native coronary artery without angina pectoris: Secondary | ICD-10-CM | POA: Diagnosis not present

## 2017-11-03 DIAGNOSIS — I110 Hypertensive heart disease with heart failure: Secondary | ICD-10-CM | POA: Diagnosis not present

## 2017-11-03 DIAGNOSIS — M6281 Muscle weakness (generalized): Secondary | ICD-10-CM | POA: Diagnosis not present

## 2017-11-03 DIAGNOSIS — I4891 Unspecified atrial fibrillation: Secondary | ICD-10-CM | POA: Diagnosis not present

## 2017-11-03 DIAGNOSIS — I69322 Dysarthria following cerebral infarction: Secondary | ICD-10-CM | POA: Diagnosis not present

## 2017-11-03 DIAGNOSIS — M1990 Unspecified osteoarthritis, unspecified site: Secondary | ICD-10-CM | POA: Diagnosis not present

## 2017-11-03 DIAGNOSIS — E119 Type 2 diabetes mellitus without complications: Secondary | ICD-10-CM | POA: Diagnosis not present

## 2017-11-03 DIAGNOSIS — I69398 Other sequelae of cerebral infarction: Secondary | ICD-10-CM | POA: Diagnosis not present

## 2017-11-03 DIAGNOSIS — I5020 Unspecified systolic (congestive) heart failure: Secondary | ICD-10-CM | POA: Diagnosis not present

## 2017-11-04 DIAGNOSIS — M6281 Muscle weakness (generalized): Secondary | ICD-10-CM | POA: Diagnosis not present

## 2017-11-04 DIAGNOSIS — I69398 Other sequelae of cerebral infarction: Secondary | ICD-10-CM | POA: Diagnosis not present

## 2017-11-04 DIAGNOSIS — I69322 Dysarthria following cerebral infarction: Secondary | ICD-10-CM | POA: Diagnosis not present

## 2017-11-04 DIAGNOSIS — I2510 Atherosclerotic heart disease of native coronary artery without angina pectoris: Secondary | ICD-10-CM | POA: Diagnosis not present

## 2017-11-04 DIAGNOSIS — M1990 Unspecified osteoarthritis, unspecified site: Secondary | ICD-10-CM | POA: Diagnosis not present

## 2017-11-04 DIAGNOSIS — I4891 Unspecified atrial fibrillation: Secondary | ICD-10-CM | POA: Diagnosis not present

## 2017-11-04 DIAGNOSIS — I5020 Unspecified systolic (congestive) heart failure: Secondary | ICD-10-CM | POA: Diagnosis not present

## 2017-11-04 DIAGNOSIS — I110 Hypertensive heart disease with heart failure: Secondary | ICD-10-CM | POA: Diagnosis not present

## 2017-11-04 DIAGNOSIS — E119 Type 2 diabetes mellitus without complications: Secondary | ICD-10-CM | POA: Diagnosis not present

## 2017-11-05 DIAGNOSIS — M6281 Muscle weakness (generalized): Secondary | ICD-10-CM | POA: Diagnosis not present

## 2017-11-05 DIAGNOSIS — I4891 Unspecified atrial fibrillation: Secondary | ICD-10-CM | POA: Diagnosis not present

## 2017-11-05 DIAGNOSIS — I2510 Atherosclerotic heart disease of native coronary artery without angina pectoris: Secondary | ICD-10-CM | POA: Diagnosis not present

## 2017-11-05 DIAGNOSIS — I69398 Other sequelae of cerebral infarction: Secondary | ICD-10-CM | POA: Diagnosis not present

## 2017-11-05 DIAGNOSIS — I69322 Dysarthria following cerebral infarction: Secondary | ICD-10-CM | POA: Diagnosis not present

## 2017-11-05 DIAGNOSIS — E119 Type 2 diabetes mellitus without complications: Secondary | ICD-10-CM | POA: Diagnosis not present

## 2017-11-05 DIAGNOSIS — I5020 Unspecified systolic (congestive) heart failure: Secondary | ICD-10-CM | POA: Diagnosis not present

## 2017-11-05 DIAGNOSIS — M1990 Unspecified osteoarthritis, unspecified site: Secondary | ICD-10-CM | POA: Diagnosis not present

## 2017-11-05 DIAGNOSIS — I110 Hypertensive heart disease with heart failure: Secondary | ICD-10-CM | POA: Diagnosis not present

## 2017-11-08 DIAGNOSIS — I5020 Unspecified systolic (congestive) heart failure: Secondary | ICD-10-CM | POA: Diagnosis not present

## 2017-11-08 DIAGNOSIS — M1990 Unspecified osteoarthritis, unspecified site: Secondary | ICD-10-CM | POA: Diagnosis not present

## 2017-11-08 DIAGNOSIS — I69322 Dysarthria following cerebral infarction: Secondary | ICD-10-CM | POA: Diagnosis not present

## 2017-11-08 DIAGNOSIS — I69398 Other sequelae of cerebral infarction: Secondary | ICD-10-CM | POA: Diagnosis not present

## 2017-11-08 DIAGNOSIS — I110 Hypertensive heart disease with heart failure: Secondary | ICD-10-CM | POA: Diagnosis not present

## 2017-11-08 DIAGNOSIS — M6281 Muscle weakness (generalized): Secondary | ICD-10-CM | POA: Diagnosis not present

## 2017-11-08 DIAGNOSIS — E119 Type 2 diabetes mellitus without complications: Secondary | ICD-10-CM | POA: Diagnosis not present

## 2017-11-08 DIAGNOSIS — I4891 Unspecified atrial fibrillation: Secondary | ICD-10-CM | POA: Diagnosis not present

## 2017-11-08 DIAGNOSIS — I2510 Atherosclerotic heart disease of native coronary artery without angina pectoris: Secondary | ICD-10-CM | POA: Diagnosis not present

## 2017-11-09 DIAGNOSIS — I5020 Unspecified systolic (congestive) heart failure: Secondary | ICD-10-CM | POA: Diagnosis not present

## 2017-11-09 DIAGNOSIS — I2510 Atherosclerotic heart disease of native coronary artery without angina pectoris: Secondary | ICD-10-CM | POA: Diagnosis not present

## 2017-11-09 DIAGNOSIS — I69322 Dysarthria following cerebral infarction: Secondary | ICD-10-CM | POA: Diagnosis not present

## 2017-11-09 DIAGNOSIS — E119 Type 2 diabetes mellitus without complications: Secondary | ICD-10-CM | POA: Diagnosis not present

## 2017-11-09 DIAGNOSIS — I4891 Unspecified atrial fibrillation: Secondary | ICD-10-CM | POA: Diagnosis not present

## 2017-11-09 DIAGNOSIS — I69398 Other sequelae of cerebral infarction: Secondary | ICD-10-CM | POA: Diagnosis not present

## 2017-11-09 DIAGNOSIS — M6281 Muscle weakness (generalized): Secondary | ICD-10-CM | POA: Diagnosis not present

## 2017-11-09 DIAGNOSIS — I110 Hypertensive heart disease with heart failure: Secondary | ICD-10-CM | POA: Diagnosis not present

## 2017-11-09 DIAGNOSIS — M1990 Unspecified osteoarthritis, unspecified site: Secondary | ICD-10-CM | POA: Diagnosis not present

## 2017-11-11 DIAGNOSIS — E119 Type 2 diabetes mellitus without complications: Secondary | ICD-10-CM | POA: Diagnosis not present

## 2017-11-11 DIAGNOSIS — I5020 Unspecified systolic (congestive) heart failure: Secondary | ICD-10-CM | POA: Diagnosis not present

## 2017-11-11 DIAGNOSIS — M1990 Unspecified osteoarthritis, unspecified site: Secondary | ICD-10-CM | POA: Diagnosis not present

## 2017-11-11 DIAGNOSIS — M6281 Muscle weakness (generalized): Secondary | ICD-10-CM | POA: Diagnosis not present

## 2017-11-11 DIAGNOSIS — I2510 Atherosclerotic heart disease of native coronary artery without angina pectoris: Secondary | ICD-10-CM | POA: Diagnosis not present

## 2017-11-11 DIAGNOSIS — I69322 Dysarthria following cerebral infarction: Secondary | ICD-10-CM | POA: Diagnosis not present

## 2017-11-11 DIAGNOSIS — I4891 Unspecified atrial fibrillation: Secondary | ICD-10-CM | POA: Diagnosis not present

## 2017-11-11 DIAGNOSIS — I110 Hypertensive heart disease with heart failure: Secondary | ICD-10-CM | POA: Diagnosis not present

## 2017-11-11 DIAGNOSIS — I69398 Other sequelae of cerebral infarction: Secondary | ICD-10-CM | POA: Diagnosis not present

## 2017-11-12 DIAGNOSIS — I5020 Unspecified systolic (congestive) heart failure: Secondary | ICD-10-CM | POA: Diagnosis not present

## 2017-11-12 DIAGNOSIS — I110 Hypertensive heart disease with heart failure: Secondary | ICD-10-CM | POA: Diagnosis not present

## 2017-11-12 DIAGNOSIS — M6281 Muscle weakness (generalized): Secondary | ICD-10-CM | POA: Diagnosis not present

## 2017-11-12 DIAGNOSIS — M1990 Unspecified osteoarthritis, unspecified site: Secondary | ICD-10-CM | POA: Diagnosis not present

## 2017-11-12 DIAGNOSIS — I69322 Dysarthria following cerebral infarction: Secondary | ICD-10-CM | POA: Diagnosis not present

## 2017-11-12 DIAGNOSIS — I2510 Atherosclerotic heart disease of native coronary artery without angina pectoris: Secondary | ICD-10-CM | POA: Diagnosis not present

## 2017-11-12 DIAGNOSIS — E119 Type 2 diabetes mellitus without complications: Secondary | ICD-10-CM | POA: Diagnosis not present

## 2017-11-12 DIAGNOSIS — I4891 Unspecified atrial fibrillation: Secondary | ICD-10-CM | POA: Diagnosis not present

## 2017-11-12 DIAGNOSIS — I69398 Other sequelae of cerebral infarction: Secondary | ICD-10-CM | POA: Diagnosis not present

## 2017-11-15 DIAGNOSIS — I4891 Unspecified atrial fibrillation: Secondary | ICD-10-CM | POA: Diagnosis not present

## 2017-11-15 DIAGNOSIS — I5020 Unspecified systolic (congestive) heart failure: Secondary | ICD-10-CM | POA: Diagnosis not present

## 2017-11-15 DIAGNOSIS — M1990 Unspecified osteoarthritis, unspecified site: Secondary | ICD-10-CM | POA: Diagnosis not present

## 2017-11-15 DIAGNOSIS — I69398 Other sequelae of cerebral infarction: Secondary | ICD-10-CM | POA: Diagnosis not present

## 2017-11-15 DIAGNOSIS — I69322 Dysarthria following cerebral infarction: Secondary | ICD-10-CM | POA: Diagnosis not present

## 2017-11-15 DIAGNOSIS — I110 Hypertensive heart disease with heart failure: Secondary | ICD-10-CM | POA: Diagnosis not present

## 2017-11-15 DIAGNOSIS — E119 Type 2 diabetes mellitus without complications: Secondary | ICD-10-CM | POA: Diagnosis not present

## 2017-11-15 DIAGNOSIS — M6281 Muscle weakness (generalized): Secondary | ICD-10-CM | POA: Diagnosis not present

## 2017-11-15 DIAGNOSIS — I2510 Atherosclerotic heart disease of native coronary artery without angina pectoris: Secondary | ICD-10-CM | POA: Diagnosis not present

## 2017-11-17 DIAGNOSIS — I69322 Dysarthria following cerebral infarction: Secondary | ICD-10-CM | POA: Diagnosis not present

## 2017-11-17 DIAGNOSIS — M6281 Muscle weakness (generalized): Secondary | ICD-10-CM | POA: Diagnosis not present

## 2017-11-17 DIAGNOSIS — M1990 Unspecified osteoarthritis, unspecified site: Secondary | ICD-10-CM | POA: Diagnosis not present

## 2017-11-17 DIAGNOSIS — I110 Hypertensive heart disease with heart failure: Secondary | ICD-10-CM | POA: Diagnosis not present

## 2017-11-17 DIAGNOSIS — I4891 Unspecified atrial fibrillation: Secondary | ICD-10-CM | POA: Diagnosis not present

## 2017-11-17 DIAGNOSIS — I2510 Atherosclerotic heart disease of native coronary artery without angina pectoris: Secondary | ICD-10-CM | POA: Diagnosis not present

## 2017-11-17 DIAGNOSIS — I5020 Unspecified systolic (congestive) heart failure: Secondary | ICD-10-CM | POA: Diagnosis not present

## 2017-11-17 DIAGNOSIS — I69398 Other sequelae of cerebral infarction: Secondary | ICD-10-CM | POA: Diagnosis not present

## 2017-11-17 DIAGNOSIS — E119 Type 2 diabetes mellitus without complications: Secondary | ICD-10-CM | POA: Diagnosis not present

## 2017-11-19 DIAGNOSIS — I10 Essential (primary) hypertension: Secondary | ICD-10-CM | POA: Diagnosis not present

## 2017-11-19 DIAGNOSIS — I69351 Hemiplegia and hemiparesis following cerebral infarction affecting right dominant side: Secondary | ICD-10-CM | POA: Diagnosis not present

## 2017-11-19 DIAGNOSIS — I482 Chronic atrial fibrillation: Secondary | ICD-10-CM | POA: Diagnosis not present

## 2017-11-19 DIAGNOSIS — R42 Dizziness and giddiness: Secondary | ICD-10-CM | POA: Diagnosis not present

## 2017-11-23 DIAGNOSIS — I4891 Unspecified atrial fibrillation: Secondary | ICD-10-CM | POA: Diagnosis not present

## 2017-11-23 DIAGNOSIS — I5020 Unspecified systolic (congestive) heart failure: Secondary | ICD-10-CM | POA: Diagnosis not present

## 2017-11-23 DIAGNOSIS — E119 Type 2 diabetes mellitus without complications: Secondary | ICD-10-CM | POA: Diagnosis not present

## 2017-11-23 DIAGNOSIS — M1990 Unspecified osteoarthritis, unspecified site: Secondary | ICD-10-CM | POA: Diagnosis not present

## 2017-11-23 DIAGNOSIS — I69398 Other sequelae of cerebral infarction: Secondary | ICD-10-CM | POA: Diagnosis not present

## 2017-11-23 DIAGNOSIS — M6281 Muscle weakness (generalized): Secondary | ICD-10-CM | POA: Diagnosis not present

## 2017-11-23 DIAGNOSIS — I69322 Dysarthria following cerebral infarction: Secondary | ICD-10-CM | POA: Diagnosis not present

## 2017-11-23 DIAGNOSIS — I2510 Atherosclerotic heart disease of native coronary artery without angina pectoris: Secondary | ICD-10-CM | POA: Diagnosis not present

## 2017-11-23 DIAGNOSIS — I110 Hypertensive heart disease with heart failure: Secondary | ICD-10-CM | POA: Diagnosis not present

## 2017-12-01 DIAGNOSIS — I69398 Other sequelae of cerebral infarction: Secondary | ICD-10-CM | POA: Diagnosis not present

## 2017-12-01 DIAGNOSIS — I4891 Unspecified atrial fibrillation: Secondary | ICD-10-CM | POA: Diagnosis not present

## 2017-12-01 DIAGNOSIS — M1990 Unspecified osteoarthritis, unspecified site: Secondary | ICD-10-CM | POA: Diagnosis not present

## 2017-12-01 DIAGNOSIS — I69322 Dysarthria following cerebral infarction: Secondary | ICD-10-CM | POA: Diagnosis not present

## 2017-12-01 DIAGNOSIS — E119 Type 2 diabetes mellitus without complications: Secondary | ICD-10-CM | POA: Diagnosis not present

## 2017-12-01 DIAGNOSIS — I110 Hypertensive heart disease with heart failure: Secondary | ICD-10-CM | POA: Diagnosis not present

## 2017-12-01 DIAGNOSIS — I2510 Atherosclerotic heart disease of native coronary artery without angina pectoris: Secondary | ICD-10-CM | POA: Diagnosis not present

## 2017-12-01 DIAGNOSIS — I5020 Unspecified systolic (congestive) heart failure: Secondary | ICD-10-CM | POA: Diagnosis not present

## 2017-12-01 DIAGNOSIS — M6281 Muscle weakness (generalized): Secondary | ICD-10-CM | POA: Diagnosis not present

## 2017-12-16 ENCOUNTER — Ambulatory Visit (INDEPENDENT_AMBULATORY_CARE_PROVIDER_SITE_OTHER): Payer: Medicare HMO | Admitting: Podiatry

## 2017-12-16 ENCOUNTER — Encounter: Payer: Self-pay | Admitting: Podiatry

## 2017-12-16 DIAGNOSIS — M79609 Pain in unspecified limb: Secondary | ICD-10-CM

## 2017-12-16 DIAGNOSIS — B351 Tinea unguium: Secondary | ICD-10-CM

## 2017-12-16 DIAGNOSIS — D689 Coagulation defect, unspecified: Secondary | ICD-10-CM

## 2017-12-16 DIAGNOSIS — E0843 Diabetes mellitus due to underlying condition with diabetic autonomic (poly)neuropathy: Secondary | ICD-10-CM

## 2017-12-16 NOTE — Progress Notes (Signed)
Complaint:  Visit Type: Patient returns to my office for continued preventative foot care services. Complaint: Patient states" my nails have grown long and thick and become painful to walk and wear shoes" Patient has been diagnosed with DM with neuropathy.. The patient presents for preventative foot care services. No changes to ROS.  Patient is on eliquiss.  Podiatric Exam: Vascular: dorsalis pedis and posterior tibial pulses are palpable bilateral. Capillary return is immediate. Temperature gradient is WNL. Skin turgor WNL  Sensorium: Normal Semmes Weinstein monofilament test. Normal tactile sensation bilaterally. Nail Exam: Pt has thick disfigured discolored nails with subungual debris noted bilateral entire nail hallux through fifth toenails Ulcer Exam: There is no evidence of ulcer or pre-ulcerative changes or infection. Orthopedic Exam: Muscle tone and strength are WNL. No limitations in general ROM. No crepitus or effusions noted. Foot type and digits show no abnormalities. PTTD  B/L.  HAV  B/l with hammer toe 2-4  B/L Skin: No Porokeratosis. No infection or ulcers  Diagnosis:  Onychomycosis, , Pain in right toe, pain in left toes  Treatment & Plan Procedures and Treatment: Consent by patient was obtained for treatment procedures. The patient understood the discussion of treatment and procedures well. All questions were answered thoroughly reviewed. Debridement of mycotic and hypertrophic toenails, 1 through 5 bilateral and clearing of subungual debris. No ulceration, no infection noted.  Return Visit-Office Procedure: Patient instructed to return to the office for a follow up visit 3 months for continued evaluation and treatment.    Helane GuntherGregory Caily Rakers DPM

## 2018-01-24 DIAGNOSIS — Z79899 Other long term (current) drug therapy: Secondary | ICD-10-CM | POA: Diagnosis not present

## 2018-01-24 DIAGNOSIS — N183 Chronic kidney disease, stage 3 (moderate): Secondary | ICD-10-CM | POA: Diagnosis not present

## 2018-01-24 DIAGNOSIS — E1122 Type 2 diabetes mellitus with diabetic chronic kidney disease: Secondary | ICD-10-CM | POA: Diagnosis not present

## 2018-02-01 DIAGNOSIS — Z7901 Long term (current) use of anticoagulants: Secondary | ICD-10-CM | POA: Diagnosis not present

## 2018-02-01 DIAGNOSIS — I69351 Hemiplegia and hemiparesis following cerebral infarction affecting right dominant side: Secondary | ICD-10-CM | POA: Diagnosis not present

## 2018-02-01 DIAGNOSIS — R600 Localized edema: Secondary | ICD-10-CM | POA: Diagnosis not present

## 2018-02-01 DIAGNOSIS — I482 Chronic atrial fibrillation: Secondary | ICD-10-CM | POA: Diagnosis not present

## 2018-02-02 DIAGNOSIS — R682 Dry mouth, unspecified: Secondary | ICD-10-CM | POA: Diagnosis not present

## 2018-02-02 DIAGNOSIS — R1310 Dysphagia, unspecified: Secondary | ICD-10-CM | POA: Diagnosis not present

## 2018-02-13 ENCOUNTER — Emergency Department
Admission: EM | Admit: 2018-02-13 | Discharge: 2018-02-13 | Disposition: A | Discharge status: Other Acute Inpatient Hospital | Payer: Medicare HMO | Attending: Emergency Medicine | Admitting: Emergency Medicine

## 2018-02-13 ENCOUNTER — Other Ambulatory Visit: Payer: Self-pay

## 2018-02-13 ENCOUNTER — Emergency Department: Payer: Medicare HMO

## 2018-02-13 ENCOUNTER — Encounter: Payer: Self-pay | Admitting: Emergency Medicine

## 2018-02-13 DIAGNOSIS — M25422 Effusion, left elbow: Secondary | ICD-10-CM | POA: Diagnosis not present

## 2018-02-13 DIAGNOSIS — W1811XA Fall from or off toilet without subsequent striking against object, initial encounter: Secondary | ICD-10-CM | POA: Insufficient documentation

## 2018-02-13 DIAGNOSIS — I509 Heart failure, unspecified: Secondary | ICD-10-CM | POA: Diagnosis not present

## 2018-02-13 DIAGNOSIS — E119 Type 2 diabetes mellitus without complications: Secondary | ICD-10-CM | POA: Diagnosis not present

## 2018-02-13 DIAGNOSIS — R2681 Unsteadiness on feet: Secondary | ICD-10-CM | POA: Diagnosis not present

## 2018-02-13 DIAGNOSIS — S199XXA Unspecified injury of neck, initial encounter: Secondary | ICD-10-CM | POA: Diagnosis not present

## 2018-02-13 DIAGNOSIS — Z66 Do not resuscitate: Secondary | ICD-10-CM | POA: Diagnosis not present

## 2018-02-13 DIAGNOSIS — G8929 Other chronic pain: Secondary | ICD-10-CM | POA: Diagnosis not present

## 2018-02-13 DIAGNOSIS — S3992XA Unspecified injury of lower back, initial encounter: Secondary | ICD-10-CM | POA: Diagnosis not present

## 2018-02-13 DIAGNOSIS — Z8673 Personal history of transient ischemic attack (TIA), and cerebral infarction without residual deficits: Secondary | ICD-10-CM | POA: Diagnosis not present

## 2018-02-13 DIAGNOSIS — S062X0A Diffuse traumatic brain injury without loss of consciousness, initial encounter: Secondary | ICD-10-CM | POA: Diagnosis not present

## 2018-02-13 DIAGNOSIS — I2510 Atherosclerotic heart disease of native coronary artery without angina pectoris: Secondary | ICD-10-CM | POA: Insufficient documentation

## 2018-02-13 DIAGNOSIS — S065X0A Traumatic subdural hemorrhage without loss of consciousness, initial encounter: Secondary | ICD-10-CM | POA: Diagnosis not present

## 2018-02-13 DIAGNOSIS — I110 Hypertensive heart disease with heart failure: Secondary | ICD-10-CM | POA: Insufficient documentation

## 2018-02-13 DIAGNOSIS — R51 Headache: Secondary | ICD-10-CM | POA: Diagnosis not present

## 2018-02-13 DIAGNOSIS — S299XXA Unspecified injury of thorax, initial encounter: Secondary | ICD-10-CM | POA: Diagnosis not present

## 2018-02-13 DIAGNOSIS — I6200 Nontraumatic subdural hemorrhage, unspecified: Secondary | ICD-10-CM | POA: Diagnosis not present

## 2018-02-13 DIAGNOSIS — M1990 Unspecified osteoarthritis, unspecified site: Secondary | ICD-10-CM | POA: Diagnosis not present

## 2018-02-13 DIAGNOSIS — Y939 Activity, unspecified: Secondary | ICD-10-CM | POA: Insufficient documentation

## 2018-02-13 DIAGNOSIS — Y999 Unspecified external cause status: Secondary | ICD-10-CM | POA: Diagnosis not present

## 2018-02-13 DIAGNOSIS — M75102 Unspecified rotator cuff tear or rupture of left shoulder, not specified as traumatic: Secondary | ICD-10-CM | POA: Diagnosis not present

## 2018-02-13 DIAGNOSIS — D689 Coagulation defect, unspecified: Secondary | ICD-10-CM | POA: Diagnosis not present

## 2018-02-13 DIAGNOSIS — I482 Chronic atrial fibrillation: Secondary | ICD-10-CM | POA: Diagnosis not present

## 2018-02-13 DIAGNOSIS — S0990XA Unspecified injury of head, initial encounter: Secondary | ICD-10-CM | POA: Diagnosis present

## 2018-02-13 DIAGNOSIS — R52 Pain, unspecified: Secondary | ICD-10-CM | POA: Diagnosis not present

## 2018-02-13 DIAGNOSIS — I161 Hypertensive emergency: Secondary | ICD-10-CM | POA: Diagnosis not present

## 2018-02-13 DIAGNOSIS — Z7901 Long term (current) use of anticoagulants: Secondary | ICD-10-CM | POA: Insufficient documentation

## 2018-02-13 DIAGNOSIS — M75121 Complete rotator cuff tear or rupture of right shoulder, not specified as traumatic: Secondary | ICD-10-CM | POA: Diagnosis not present

## 2018-02-13 DIAGNOSIS — Y929 Unspecified place or not applicable: Secondary | ICD-10-CM | POA: Insufficient documentation

## 2018-02-13 DIAGNOSIS — I5032 Chronic diastolic (congestive) heart failure: Secondary | ICD-10-CM | POA: Diagnosis not present

## 2018-02-13 DIAGNOSIS — Z79899 Other long term (current) drug therapy: Secondary | ICD-10-CM | POA: Diagnosis not present

## 2018-02-13 DIAGNOSIS — S065X9A Traumatic subdural hemorrhage with loss of consciousness of unspecified duration, initial encounter: Secondary | ICD-10-CM

## 2018-02-13 DIAGNOSIS — S065XAA Traumatic subdural hemorrhage with loss of consciousness status unknown, initial encounter: Secondary | ICD-10-CM

## 2018-02-13 DIAGNOSIS — R4182 Altered mental status, unspecified: Secondary | ICD-10-CM | POA: Diagnosis not present

## 2018-02-13 DIAGNOSIS — Z951 Presence of aortocoronary bypass graft: Secondary | ICD-10-CM | POA: Diagnosis not present

## 2018-02-13 DIAGNOSIS — I4891 Unspecified atrial fibrillation: Secondary | ICD-10-CM | POA: Diagnosis not present

## 2018-02-13 DIAGNOSIS — S4992XA Unspecified injury of left shoulder and upper arm, initial encounter: Secondary | ICD-10-CM | POA: Diagnosis not present

## 2018-02-13 DIAGNOSIS — E785 Hyperlipidemia, unspecified: Secondary | ICD-10-CM | POA: Diagnosis not present

## 2018-02-13 DIAGNOSIS — I712 Thoracic aortic aneurysm, without rupture: Secondary | ICD-10-CM | POA: Diagnosis not present

## 2018-02-13 DIAGNOSIS — S06309A Unspecified focal traumatic brain injury with loss of consciousness of unspecified duration, initial encounter: Secondary | ICD-10-CM | POA: Diagnosis not present

## 2018-02-13 DIAGNOSIS — S59902A Unspecified injury of left elbow, initial encounter: Secondary | ICD-10-CM | POA: Diagnosis not present

## 2018-02-13 LAB — CBC WITH DIFFERENTIAL/PLATELET
BASOS ABS: 0 10*3/uL (ref 0–0.1)
BASOS PCT: 1 %
EOS ABS: 0.1 10*3/uL (ref 0–0.7)
Eosinophils Relative: 2 %
HCT: 31.9 % — ABNORMAL LOW (ref 35.0–47.0)
HEMOGLOBIN: 10.6 g/dL — AB (ref 12.0–16.0)
LYMPHS ABS: 1.2 10*3/uL (ref 1.0–3.6)
Lymphocytes Relative: 19 %
MCH: 28.6 pg (ref 26.0–34.0)
MCHC: 33.3 g/dL (ref 32.0–36.0)
MCV: 85.7 fL (ref 80.0–100.0)
Monocytes Absolute: 0.6 10*3/uL (ref 0.2–0.9)
Monocytes Relative: 10 %
NEUTROS PCT: 68 %
Neutro Abs: 4.4 10*3/uL (ref 1.4–6.5)
Platelets: 276 10*3/uL (ref 150–440)
RBC: 3.73 MIL/uL — AB (ref 3.80–5.20)
RDW: 13.8 % (ref 11.5–14.5)
WBC: 6.4 10*3/uL (ref 3.6–11.0)

## 2018-02-13 LAB — COMPREHENSIVE METABOLIC PANEL
ALBUMIN: 3.6 g/dL (ref 3.5–5.0)
ALT: 11 U/L (ref 0–44)
AST: 19 U/L (ref 15–41)
Alkaline Phosphatase: 97 U/L (ref 38–126)
Anion gap: 10 (ref 5–15)
BUN: 23 mg/dL (ref 8–23)
CHLORIDE: 99 mmol/L (ref 98–111)
CO2: 27 mmol/L (ref 22–32)
Calcium: 9.2 mg/dL (ref 8.9–10.3)
Creatinine, Ser: 1.08 mg/dL — ABNORMAL HIGH (ref 0.44–1.00)
GFR calc Af Amer: 50 mL/min — ABNORMAL LOW (ref >60)
GFR calc non Af Amer: 44 mL/min — ABNORMAL LOW (ref >60)
GLUCOSE: 172 mg/dL — AB (ref 70–99)
Potassium: 4.3 mmol/L (ref 3.5–5.1)
SODIUM: 136 mmol/L (ref 135–145)
Total Bilirubin: 0.8 mg/dL (ref 0.3–1.2)
Total Protein: 7 g/dL (ref 6.5–8.1)

## 2018-02-13 LAB — PROTIME-INR
INR: 1.6
Prothrombin Time: 18.9 seconds — ABNORMAL HIGH (ref 11.4–15.2)

## 2018-02-13 NOTE — ED Triage Notes (Signed)
Larey SeatFell this morning when getting up from the commode.  C/O right arm pain.

## 2018-02-13 NOTE — ED Notes (Addendum)
First Nurse Note: [facility] states she fell in [place] this AM and struck her head.  Patient states she takes Eloquis.  Alert and oriented, no obvious injury noted. When questioned about injury, patient indicates top of her head on the right.  No visible injury noted.

## 2018-02-13 NOTE — ED Notes (Signed)
[facility] present [facility]. All pt's belongings given to family. Transfer consent signed by pt's daughter per pt request.

## 2018-02-13 NOTE — ED Notes (Signed)
See triage note. Pt states she had a fall this am while getting off of commode. Pt is A&Ox4. Denies any new or worsening weakness. On Eliquis. Small hematoma with small abrasion noted to R frontal scalp. Pt only c/o pain at this time is L shoulder, chronic from rotator cuff injury. Daughter at bedside.

## 2018-02-13 NOTE — ED Provider Notes (Addendum)
[facility] Emergency Department Provider Note  ____________________________________________   I have reviewed the triage vital signs and the nursing notes. Where available I have reviewed prior notes and, if possible and indicated, outside hospital notes.    HISTORY  Chief Complaint Fall    HPI Veronica Short is a 84 y.o. female with a history of multiple medical problems on blood thinners presents today after a non-syncopal fall she was getting up from the toilet lost her balance and fell.  She is chronic left shoulder pain, she states that hurts today.  She also states she bumped her head.  Did not pass out has no other complaints.  There is no prodrome or syncopal syndrome. Patient states she does have chronic neck pain, that is unchanged from prior.  She has chronic shoulder pain too.  She should resolve this to her chronic arthritis.    Past Medical History:  Diagnosis Date  . Arthritis   . CAD (coronary artery disease)   . CHF (congestive heart failure) (HCC)   . Diabetes mellitus without complication (HCC)   . High cholesterol   . Hypertension     Patient Active Problem List   Diagnosis Date Noted  . CVA (cerebral vascular accident) (HCC) 10/06/2017  . Speech disorder 01/31/2015  . Closed 2-part nondisplaced fracture of surgical neck of left humerus 01/07/2015  . Rotator cuff arthropathy 01/07/2015  . Left rotator cuff tear arthropathy 01/07/2015  . TIA (transient ischemic attack) 01/03/2015  . CAD (coronary artery disease) 01/03/2015  . Diabetes mellitus without complication (HCC) 01/03/2015  . Hypertension 01/03/2015  . High cholesterol 01/03/2015  . Cerebral thrombosis with cerebral infarction (HCC) 01/03/2015  . Cerebral infarction due to thrombosis of cerebral artery (HCC) 01/03/2015  . Transient cerebral ischemia 01/03/2015  . Atrial fibrillation, unspecified   . Speech disturbance   . Osteoarthritis 03/23/2014  . Aortic valve  disorder 11/20/2013  . CAD (coronary artery disease), native coronary artery 11/20/2013  . DDD (degenerative disc disease), lumbosacral 11/20/2013  . Obesity, unspecified 11/20/2013  . Osteoporosis 11/20/2013  . Type II or unspecified type diabetes mellitus without mention of complication, not stated as uncontrolled 11/20/2013  . Atrial fibrillation (HCC) 11/01/2013  . Other and unspecified hyperlipidemia 11/01/2013  . S/P CABG x 4 11/01/2013  . Pacemaker 02/22/2012  . H/O cardiac catheterization 11/10/1993  . MI (myocardial infarction) (HCC) 01/15/1991    Past Surgical History:  Procedure Laterality Date  . CHOLECYSTECTOMY    . CORONARY ARTERY BYPASS GRAFT    . PACEMAKER INSERTION      Prior to Admission medications   Medication Sig Start Date End Date Taking? Authorizing Provider  acetaminophen (TYLENOL) 500 MG tablet Take 1,000 mg by mouth every 6 (six) hours as needed for mild pain.    [provider]  albuterol (PROVENTIL HFA;VENTOLIN HFA) 108 (90 Base) MCG/ACT inhaler Inhale 2 puffs into the lungs every 6 (six) hours as needed for wheezing or shortness of breath. 10/27/15   Leona Carry, MD  apixaban (ELIQUIS) 5 MG TABS tablet Take 1 tablet (5 mg total) by mouth 2 (two) times daily. 01/04/15   Catarina Hartshorn, MD  atenolol (TENORMIN) 100 MG tablet Take 100 mg by mouth daily.  10/14/13   [provider]  atorvastatin (LIPITOR) 40 MG tablet Take 1 tablet (40 mg total) by mouth at bedtime. 01/04/15   Catarina Hartshorn, MD  Calcium Carbonate-Vitamin D (CALCIUM 600+D) 600-400 MG-UNIT tablet Take 2 tablets by mouth 2 (  two) times daily.    [provider]  CARTIA XT 180 MG 24 hr capsule Take 180 mg by mouth daily.  04/05/17   [provider]  Cholecalciferol (VITAMIN D PO) Take 1 tablet by mouth every morning.    [provider]  DULoxetine (CYMBALTA) 60 MG capsule Take 60 mg by mouth daily.     [provider]  fluticasone (FLONASE) 50 MCG/ACT  nasal spray Place 2 sprays into both nostrils daily.    [provider]  furosemide (LASIX) 20 MG tablet Take 20 mg by mouth daily. 07/14/17   [provider]  losartan (COZAAR) 25 MG tablet Take 25 mg by mouth daily.    [provider]  metFORMIN (GLUCOPHAGE-XR) 500 MG 24 hr tablet Take 500 mg by mouth daily with breakfast.  11/01/13   [provider]  nitroGLYCERIN (NITROSTAT) 0.4 MG SL tablet Place 0.4 mg under the tongue every 5 (five) minutes as needed for chest pain.    [provider]  traMADol (ULTRAM) 50 MG tablet Take 50 mg by mouth every 6 (six) hours as needed for moderate pain.    [provider]    Allergies Morphine and Penicillins  No family history on file.  Social History Social History   Tobacco Use  . Smoking status: Never Smoker  . Smokeless tobacco: Never Used  Substance Use Topics  . Alcohol use: No  . Drug use: No    Review of Systems Constitutional: No fever/chills Eyes: No visual changes. ENT: No sore throat. No stiff neck no neck pain Cardiovascular: Denies chest pain. Respiratory: Denies shortness of breath. Gastrointestinal:   no vomiting.  No diarrhea.  No constipation. Genitourinary: Negative for dysuria. Musculoskeletal: Negative lower extremity swelling Skin: Negative for rash. Neurological: Negative for severe headaches, focal weakness or numbness.   ____________________________________________   PHYSICAL EXAM:  VITAL SIGNS: ED Triage Vitals  Enc Vitals Group     BP 02/13/18 0841 (!) 155/63     Pulse Rate 02/13/18 0841 76     Resp 02/13/18 0841 20     Temp 02/13/18 0841 (!) 97.4 F (36.3 C)     Temp Source 02/13/18 0841 Oral     SpO2 02/13/18 0841 96 %     Weight 02/13/18 0841 147 lb (66.7 kg)     Height 02/13/18 0841 4\' 9"  (1.448 m)     Head Circumference --      Peak Flow --      Pain Score 02/13/18 0845 5     Pain Loc --      Pain Edu? --      Excl. in GC? --      Constitutional: Alert and oriented. Well appearing and in no acute distress. Eyes: Conjunctivae are normal Head: Atraumatic HEENT: No congestion/rhinnorhea. Mucous membranes are moist.  Oropharynx non-erythematous Neck:   Minimal disc diffuse tenderness which patient attributes to her arthritis with no meningismus, no masses, no stridor Cardiovascular: Normal rate, regular rhythm. Grossly normal heart sounds.  Good peripheral circulation. Respiratory: Normal respiratory effort.  No retractions. Lungs CTAB. Abdominal: Soft and nontender. No distention. No guarding no rebound Back:  There is no focal tenderness or step off.  there is no midline tenderness there are no lesions noted. there is no CVA tenderness Musculoskeletal: No lower extremity tenderness, there is some tenderness palpation to left shoulder, no joint effusions, no DVT signs strong distal pulses no edema Neurologic:  Normal speech and language. No gross  focal neurologic deficits are appreciated.  Skin:  Skin is warm, dry and intact. No rash noted. Psychiatric: Mood and affect are normal. Speech and behavior are normal.  ____________________________________________   LABS (all labs ordered are listed, but only abnormal results are displayed)  Labs Reviewed - No data to display  Pertinent labs  results that were available during my care of the patient were reviewed by me and considered in my medical decision making (see chart for details). ____________________________________________  EKG  I personally interpreted any EKGs ordered by me or triage ----------------------------------------- 1:40 AM on 02/25/2018 -----------------------------------------  Late entry: ecg: Rate 80 bpm, significant artifact limits interpretation, possibly atrial flutter or fib, significant baseline wander, no acute ST elevation or depression detected but again limited  EKG ____________________________________________  RADIOLOGY  Pertinent labs & imaging results that were available during my care of the patient were reviewed by me and considered in my medical decision making (see chart for details). If possible, patient and/or family made aware of any abnormal findings.  No results found. ____________________________________________    PROCEDURES  Procedure(s) performed: None  Procedures  Critical Care performed: CRITICAL CARE Performed by: Jeanmarie PlantJAMES A Kenidi Elenbaas   Total critical care time: 45 minutes  Critical care time was exclusive of separately billable procedures and treating other patients.  Critical care was necessary to treat or prevent imminent or life-threatening deterioration.  Critical care was time spent personally by me on the following activities: development of treatment plan with patient and/or surrogate as well as nursing, discussions with consultants, evaluation of patient's response to treatment, examination of patient, obtaining history from patient or surrogate, ordering and performing treatments and interventions, ordering and review of laboratory studies, ordering and review of radiographic studies, pulse oximetry and re-evaluation of patient's condition.   ____________________________________________   INITIAL IMPRESSION / ASSESSMENT AND PLAN / ED COURSE  Pertinent labs & imaging results that were available during my care of the patient were reviewed by me and considered in my medical decision making (see chart for details).  Patient here after a non-syncopal fall where she tripped in the bathroom and bumped her head did not pass out because she is on blood thinners obtain CT of her head, low suspicion for significant injury there, because she has neck pain even though it appears to be chronic arthritic issues we will obtain imaging of that as well.  Patient does have shoulder tenderness, no bruising noted, will obtain imaging  there as well although again she states this is chronic.  Is my hope and get her home.  This was a non-syncopal fall,  ----------------------------------------- 10:11 AM on 02/13/2018 -----------------------------------------  Patient reported to have a subdural hematoma 9 mm no shift she remains in no acute distress not even complaining of headache, however she is on blood thinners, for atrial fibrillation.  We are obtaining IV, obtaining basic blood work, I have called [facility] transfer center, I am currently on hold for transfer to that facility.  Family, and power of attorney, at bedside agree with this plan.  ----------------------------------------- 10:37 AM on 02/13/2018 -----------------------------------------  Neurologically intact at this time, family and power of care states that she would want everything done including intubation if should she deteriorate.       ____________________________________________   FINAL CLINICAL IMPRESSION(S) / ED DIAGNOSES  Final diagnoses:  None      This chart was dictated using voice recognition software.  Despite best efforts to proofread,  errors can occur which can change meaning.  Jeanmarie Plant, MD 02/13/18 1610    Jeanmarie Plant, MD 02/13/18 1037    Jeanmarie Plant, MD 02/25/18 [phone]

## 2018-02-19 DIAGNOSIS — S065X0D Traumatic subdural hemorrhage without loss of consciousness, subsequent encounter: Secondary | ICD-10-CM | POA: Diagnosis not present

## 2018-02-19 DIAGNOSIS — I110 Hypertensive heart disease with heart failure: Secondary | ICD-10-CM | POA: Diagnosis not present

## 2018-02-19 DIAGNOSIS — E119 Type 2 diabetes mellitus without complications: Secondary | ICD-10-CM | POA: Diagnosis not present

## 2018-02-19 DIAGNOSIS — I5032 Chronic diastolic (congestive) heart failure: Secondary | ICD-10-CM | POA: Diagnosis not present

## 2018-02-19 DIAGNOSIS — I2510 Atherosclerotic heart disease of native coronary artery without angina pectoris: Secondary | ICD-10-CM | POA: Diagnosis not present

## 2018-02-19 DIAGNOSIS — I69328 Other speech and language deficits following cerebral infarction: Secondary | ICD-10-CM | POA: Diagnosis not present

## 2018-02-22 DIAGNOSIS — I2510 Atherosclerotic heart disease of native coronary artery without angina pectoris: Secondary | ICD-10-CM | POA: Diagnosis not present

## 2018-02-22 DIAGNOSIS — I5032 Chronic diastolic (congestive) heart failure: Secondary | ICD-10-CM | POA: Diagnosis not present

## 2018-02-22 DIAGNOSIS — E119 Type 2 diabetes mellitus without complications: Secondary | ICD-10-CM | POA: Diagnosis not present

## 2018-02-22 DIAGNOSIS — S065X0D Traumatic subdural hemorrhage without loss of consciousness, subsequent encounter: Secondary | ICD-10-CM | POA: Diagnosis not present

## 2018-02-22 DIAGNOSIS — R112 Nausea with vomiting, unspecified: Secondary | ICD-10-CM | POA: Diagnosis not present

## 2018-02-22 DIAGNOSIS — I110 Hypertensive heart disease with heart failure: Secondary | ICD-10-CM | POA: Diagnosis not present

## 2018-02-22 DIAGNOSIS — I69328 Other speech and language deficits following cerebral infarction: Secondary | ICD-10-CM | POA: Diagnosis not present

## 2018-02-23 ENCOUNTER — Emergency Department
Admission: EM | Admit: 2018-02-23 | Discharge: 2018-02-23 | Disposition: A | Discharge status: Other Acute Inpatient Hospital | Payer: Medicare HMO | Attending: Emergency Medicine | Admitting: Emergency Medicine

## 2018-02-23 ENCOUNTER — Emergency Department: Payer: Medicare HMO

## 2018-02-23 ENCOUNTER — Encounter: Payer: Self-pay | Admitting: Emergency Medicine

## 2018-02-23 ENCOUNTER — Other Ambulatory Visit: Payer: Self-pay

## 2018-02-23 DIAGNOSIS — I110 Hypertensive heart disease with heart failure: Secondary | ICD-10-CM | POA: Diagnosis not present

## 2018-02-23 DIAGNOSIS — R112 Nausea with vomiting, unspecified: Secondary | ICD-10-CM | POA: Diagnosis not present

## 2018-02-23 DIAGNOSIS — I2510 Atherosclerotic heart disease of native coronary artery without angina pectoris: Secondary | ICD-10-CM | POA: Insufficient documentation

## 2018-02-23 DIAGNOSIS — S065X0D Traumatic subdural hemorrhage without loss of consciousness, subsequent encounter: Secondary | ICD-10-CM | POA: Diagnosis not present

## 2018-02-23 DIAGNOSIS — J811 Chronic pulmonary edema: Secondary | ICD-10-CM | POA: Diagnosis not present

## 2018-02-23 DIAGNOSIS — D72829 Elevated white blood cell count, unspecified: Secondary | ICD-10-CM | POA: Diagnosis not present

## 2018-02-23 DIAGNOSIS — E878 Other disorders of electrolyte and fluid balance, not elsewhere classified: Secondary | ICD-10-CM | POA: Diagnosis not present

## 2018-02-23 DIAGNOSIS — Z951 Presence of aortocoronary bypass graft: Secondary | ICD-10-CM | POA: Insufficient documentation

## 2018-02-23 DIAGNOSIS — R030 Elevated blood-pressure reading, without diagnosis of hypertension: Secondary | ICD-10-CM | POA: Diagnosis not present

## 2018-02-23 DIAGNOSIS — E119 Type 2 diabetes mellitus without complications: Secondary | ICD-10-CM | POA: Diagnosis not present

## 2018-02-23 DIAGNOSIS — I509 Heart failure, unspecified: Secondary | ICD-10-CM | POA: Insufficient documentation

## 2018-02-23 DIAGNOSIS — Z950 Presence of cardiac pacemaker: Secondary | ICD-10-CM | POA: Insufficient documentation

## 2018-02-23 DIAGNOSIS — I619 Nontraumatic intracerebral hemorrhage, unspecified: Secondary | ICD-10-CM | POA: Diagnosis not present

## 2018-02-23 DIAGNOSIS — K2940 Chronic atrophic gastritis without bleeding: Secondary | ICD-10-CM | POA: Diagnosis not present

## 2018-02-23 DIAGNOSIS — E1165 Type 2 diabetes mellitus with hyperglycemia: Secondary | ICD-10-CM | POA: Diagnosis not present

## 2018-02-23 DIAGNOSIS — J90 Pleural effusion, not elsewhere classified: Secondary | ICD-10-CM | POA: Diagnosis not present

## 2018-02-23 DIAGNOSIS — R0602 Shortness of breath: Secondary | ICD-10-CM | POA: Diagnosis not present

## 2018-02-23 DIAGNOSIS — R1110 Vomiting, unspecified: Secondary | ICD-10-CM

## 2018-02-23 DIAGNOSIS — R5381 Other malaise: Secondary | ICD-10-CM | POA: Diagnosis not present

## 2018-02-23 DIAGNOSIS — R300 Dysuria: Secondary | ICD-10-CM | POA: Diagnosis not present

## 2018-02-23 DIAGNOSIS — I10 Essential (primary) hypertension: Secondary | ICD-10-CM | POA: Diagnosis not present

## 2018-02-23 DIAGNOSIS — R51 Headache: Secondary | ICD-10-CM | POA: Diagnosis not present

## 2018-02-23 DIAGNOSIS — K311 Adult hypertrophic pyloric stenosis: Secondary | ICD-10-CM | POA: Diagnosis not present

## 2018-02-23 DIAGNOSIS — R109 Unspecified abdominal pain: Secondary | ICD-10-CM | POA: Diagnosis not present

## 2018-02-23 DIAGNOSIS — S065X9A Traumatic subdural hemorrhage with loss of consciousness of unspecified duration, initial encounter: Secondary | ICD-10-CM

## 2018-02-23 DIAGNOSIS — E860 Dehydration: Secondary | ICD-10-CM | POA: Diagnosis not present

## 2018-02-23 DIAGNOSIS — M6281 Muscle weakness (generalized): Secondary | ICD-10-CM | POA: Diagnosis not present

## 2018-02-23 DIAGNOSIS — I6200 Nontraumatic subdural hemorrhage, unspecified: Secondary | ICD-10-CM | POA: Diagnosis not present

## 2018-02-23 DIAGNOSIS — S0990XD Unspecified injury of head, subsequent encounter: Secondary | ICD-10-CM | POA: Diagnosis present

## 2018-02-23 DIAGNOSIS — J159 Unspecified bacterial pneumonia: Secondary | ICD-10-CM | POA: Diagnosis not present

## 2018-02-23 DIAGNOSIS — X58XXXD Exposure to other specified factors, subsequent encounter: Secondary | ICD-10-CM | POA: Diagnosis not present

## 2018-02-23 DIAGNOSIS — J441 Chronic obstructive pulmonary disease with (acute) exacerbation: Secondary | ICD-10-CM | POA: Diagnosis not present

## 2018-02-23 DIAGNOSIS — S065X9D Traumatic subdural hemorrhage with loss of consciousness of unspecified duration, subsequent encounter: Secondary | ICD-10-CM | POA: Diagnosis not present

## 2018-02-23 DIAGNOSIS — Z4682 Encounter for fitting and adjustment of non-vascular catheter: Secondary | ICD-10-CM | POA: Diagnosis not present

## 2018-02-23 DIAGNOSIS — I4891 Unspecified atrial fibrillation: Secondary | ICD-10-CM | POA: Diagnosis not present

## 2018-02-23 DIAGNOSIS — R279 Unspecified lack of coordination: Secondary | ICD-10-CM | POA: Diagnosis not present

## 2018-02-23 DIAGNOSIS — J690 Pneumonitis due to inhalation of food and vomit: Secondary | ICD-10-CM | POA: Diagnosis not present

## 2018-02-23 DIAGNOSIS — S065X0A Traumatic subdural hemorrhage without loss of consciousness, initial encounter: Secondary | ICD-10-CM | POA: Diagnosis not present

## 2018-02-23 DIAGNOSIS — E7849 Other hyperlipidemia: Secondary | ICD-10-CM | POA: Diagnosis not present

## 2018-02-23 DIAGNOSIS — Z79899 Other long term (current) drug therapy: Secondary | ICD-10-CM | POA: Insufficient documentation

## 2018-02-23 DIAGNOSIS — R140 Abdominal distension (gaseous): Secondary | ICD-10-CM | POA: Diagnosis not present

## 2018-02-23 DIAGNOSIS — J439 Emphysema, unspecified: Secondary | ICD-10-CM | POA: Diagnosis not present

## 2018-02-23 DIAGNOSIS — S065XAA Traumatic subdural hemorrhage with loss of consciousness status unknown, initial encounter: Secondary | ICD-10-CM

## 2018-02-23 DIAGNOSIS — E1143 Type 2 diabetes mellitus with diabetic autonomic (poly)neuropathy: Secondary | ICD-10-CM | POA: Diagnosis not present

## 2018-02-23 DIAGNOSIS — K210 Gastro-esophageal reflux disease with esophagitis: Secondary | ICD-10-CM | POA: Diagnosis not present

## 2018-02-23 DIAGNOSIS — E871 Hypo-osmolality and hyponatremia: Secondary | ICD-10-CM | POA: Diagnosis not present

## 2018-02-23 LAB — CBC WITH DIFFERENTIAL/PLATELET
BASOS ABS: 0.1 10*3/uL (ref 0–0.1)
BASOS PCT: 1 %
EOS ABS: 0.1 10*3/uL (ref 0–0.7)
EOS PCT: 1 %
HCT: 28.8 % — ABNORMAL LOW (ref 35.0–47.0)
Hemoglobin: 9.8 g/dL — ABNORMAL LOW (ref 12.0–16.0)
LYMPHS PCT: 8 %
Lymphs Abs: 1.1 10*3/uL (ref 1.0–3.6)
MCH: 29.2 pg (ref 26.0–34.0)
MCHC: 34 g/dL (ref 32.0–36.0)
MCV: 85.9 fL (ref 80.0–100.0)
MONO ABS: 0.9 10*3/uL (ref 0.2–0.9)
Monocytes Relative: 7 %
Neutro Abs: 11.2 10*3/uL — ABNORMAL HIGH (ref 1.4–6.5)
Neutrophils Relative %: 83 %
PLATELETS: 408 10*3/uL (ref 150–440)
RBC: 3.35 MIL/uL — AB (ref 3.80–5.20)
RDW: 14.1 % (ref 11.5–14.5)
WBC: 13.4 10*3/uL — ABNORMAL HIGH (ref 3.6–11.0)

## 2018-02-23 LAB — PROTIME-INR
INR: 1.14
Prothrombin Time: 14.5 seconds (ref 11.4–15.2)

## 2018-02-23 LAB — URINALYSIS, COMPLETE (UACMP) WITH MICROSCOPIC
BILIRUBIN URINE: NEGATIVE
Glucose, UA: 50 mg/dL — AB
Hgb urine dipstick: NEGATIVE
KETONES UR: NEGATIVE mg/dL
Nitrite: NEGATIVE
Protein, ur: 100 mg/dL — AB
SPECIFIC GRAVITY, URINE: 1.01 (ref 1.005–1.030)
pH: 7 (ref 5.0–8.0)

## 2018-02-23 LAB — TROPONIN I

## 2018-02-23 LAB — COMPREHENSIVE METABOLIC PANEL
ALK PHOS: 102 U/L (ref 38–126)
ALT: 14 U/L (ref 0–44)
AST: 25 U/L (ref 15–41)
Albumin: 3.5 g/dL (ref 3.5–5.0)
Anion gap: 10 (ref 5–15)
BILIRUBIN TOTAL: 0.9 mg/dL (ref 0.3–1.2)
BUN: 19 mg/dL (ref 8–23)
CALCIUM: 9.4 mg/dL (ref 8.9–10.3)
CO2: 26 mmol/L (ref 22–32)
CREATININE: 0.91 mg/dL (ref 0.44–1.00)
Chloride: 97 mmol/L — ABNORMAL LOW (ref 98–111)
GFR calc Af Amer: >60 mL/min (ref >60)
GFR, EST NON AFRICAN AMERICAN: 54 mL/min — AB (ref >60)
Glucose, Bld: 230 mg/dL — ABNORMAL HIGH (ref 70–99)
Potassium: 3.7 mmol/L (ref 3.5–5.1)
Sodium: 133 mmol/L — ABNORMAL LOW (ref 135–145)
TOTAL PROTEIN: 7.4 g/dL (ref 6.5–8.1)

## 2018-02-23 LAB — LIPASE, BLOOD: Lipase: 47 U/L (ref 11–51)

## 2018-02-23 MED ORDER — LABETALOL HCL 5 MG/ML IV SOLN
10.0000 mg | Freq: Once | INTRAVENOUS | Status: AC
  Administered 2018-02-23: 10 mg via INTRAVENOUS

## 2018-02-23 MED ORDER — LABETALOL HCL 5 MG/ML IV SOLN
10.0000 mg | Freq: Once | INTRAVENOUS | Status: AC
  Administered 2018-02-23: 10 mg via INTRAVENOUS
  Filled 2018-02-23: qty 4

## 2018-02-23 MED ORDER — ONDANSETRON HCL 4 MG/2ML IJ SOLN
INTRAMUSCULAR | Status: AC
  Filled 2018-02-23: qty 2

## 2018-02-23 MED ORDER — LABETALOL HCL 5 MG/ML IV SOLN
INTRAVENOUS | Status: AC
  Filled 2018-02-23: qty 4

## 2018-02-23 MED ORDER — ONDANSETRON HCL 4 MG/2ML IJ SOLN
4.0000 mg | Freq: Once | INTRAMUSCULAR | Status: AC
  Administered 2018-02-23: 4 mg via INTRAVENOUS

## 2018-02-23 NOTE — ED Triage Notes (Addendum)
Patient coming in from home for nausea/ vomiting that started at 8pm tonight. Patient was diagnosed with subdural hematoma from a fall on 8/4 and [facility] and [facility]c on 8/9. Lives at home with family but tonight her BP was 204/89 (does have hx of hypertension) and has had a headache on and off. Family wanted to patient to be checked out and have a CT scan due to new symptoms. EMS gave 4mg  of zofran IV.

## 2018-02-23 NOTE — ED Notes (Signed)
[facility] , [facility]Jackie RN [facility]. [facility] attempting to arrange transport.

## 2018-02-23 NOTE — ED Notes (Signed)
EMTALA completed, Transfer signature completed, VS within 30 minutes of transfer completed.

## 2018-02-23 NOTE — ED Notes (Signed)
ED Provider at bedside., [facility]

## 2018-02-23 NOTE — ED Provider Notes (Signed)
[facility] Emergency Department Provider Note ____________________________________________   First MD Initiated Contact with Patient 02/23/18 0139     (approximate)  I have reviewed the triage vital signs and the nursing notes.   HISTORY  Chief Complaint Vomiting    HPI Veronica Short is a 84 y.o. female who presents with vomiting, acute onset this evening after eating, now resolved, and associated with intermittent headache.  Per the daughter, the patient has been doing well at home since she was discharged from the hospital for an intracranial hemorrhage.  Her blood pressures have been controlled.  Today reported that she felt unwell and took a nap in the afternoon.  In the evening she ate soup, but then began vomiting.  She also reported a headache intermittently throughout the day.  Past Medical History:  Diagnosis Date  . Arthritis   . CAD (coronary artery disease)   . CHF (congestive heart failure) (HCC)   . Diabetes mellitus without complication (HCC)   . High cholesterol   . Hypertension     Patient Active Problem List   Diagnosis Date Noted  . CVA (cerebral vascular accident) (HCC) 10/06/2017  . Speech disorder 01/31/2015  . Closed 2-part nondisplaced fracture of surgical neck of left humerus 01/07/2015  . Rotator cuff arthropathy 01/07/2015  . Left rotator cuff tear arthropathy 01/07/2015  . TIA (transient ischemic attack) 01/03/2015  . CAD (coronary artery disease) 01/03/2015  . Diabetes mellitus without complication (HCC) 01/03/2015  . Hypertension 01/03/2015  . High cholesterol 01/03/2015  . Cerebral thrombosis with cerebral infarction (HCC) 01/03/2015  . Cerebral infarction due to thrombosis of cerebral artery (HCC) 01/03/2015  . Transient cerebral ischemia 01/03/2015  . Atrial fibrillation, unspecified   . Speech disturbance   . Osteoarthritis 03/23/2014  . Aortic valve disorder 11/20/2013  . CAD (coronary artery disease),  native coronary artery 11/20/2013  . DDD (degenerative disc disease), lumbosacral 11/20/2013  . Obesity, unspecified 11/20/2013  . Osteoporosis 11/20/2013  . Type II or unspecified type diabetes mellitus without mention of complication, not stated as uncontrolled 11/20/2013  . Atrial fibrillation (HCC) 11/01/2013  . Other and unspecified hyperlipidemia 11/01/2013  . S/P CABG x 4 11/01/2013  . Pacemaker 02/22/2012  . H/O cardiac catheterization 11/10/1993  . MI (myocardial infarction) (HCC) 01/15/1991    Past Surgical History:  Procedure Laterality Date  . CHOLECYSTECTOMY    . CORONARY ARTERY BYPASS GRAFT    . PACEMAKER INSERTION      Prior to Admission medications   Medication Sig Start Date End Date Taking? Authorizing Provider  acetaminophen (TYLENOL) 500 MG tablet Take 1,000 mg by mouth every 6 (six) hours as needed for mild pain.    [provider]  albuterol (PROVENTIL HFA;VENTOLIN HFA) 108 (90 Base) MCG/ACT inhaler Inhale 2 puffs into the lungs every 6 (six) hours as needed for wheezing or shortness of breath. 10/27/15   Leona Carryaylor, Linda M, MD  apixaban (ELIQUIS) 5 MG TABS tablet Take 1 tablet (5 mg total) by mouth 2 (two) times daily. 01/04/15   Catarina Hartshornat, David, MD  atenolol (TENORMIN) 100 MG tablet Take 100 mg by mouth daily.  10/14/13   [provider]  atorvastatin (LIPITOR) 40 MG tablet Take 1 tablet (40 mg total) by mouth at bedtime. 01/04/15   Catarina Hartshornat, David, MD  Calcium Carbonate-Vitamin D (CALCIUM 600+D) 600-400 MG-UNIT tablet Take 2 tablets by mouth 2 (two) times daily.    [provider]  CARTIA XT 180 MG 24 hr  capsule Take 180 mg by mouth daily.  04/05/17   [provider]  Cholecalciferol (VITAMIN D PO) Take 1 tablet by mouth every morning.    [provider]  DULoxetine (CYMBALTA) 60 MG capsule Take 60 mg by mouth daily.     [provider]  fluticasone (FLONASE) 50 MCG/ACT nasal spray Place 2 sprays into both nostrils daily.     [provider]  furosemide (LASIX) 20 MG tablet Take 20 mg by mouth daily. 07/14/17   [provider]  losartan (COZAAR) 25 MG tablet Take 25 mg by mouth daily.    [provider]  metFORMIN (GLUCOPHAGE-XR) 500 MG 24 hr tablet Take 500 mg by mouth daily with breakfast.  11/01/13   [provider]  nitroGLYCERIN (NITROSTAT) 0.4 MG SL tablet Place 0.4 mg under the tongue every 5 (five) minutes as needed for chest pain.    [provider]  traMADol (ULTRAM) 50 MG tablet Take 50 mg by mouth every 6 (six) hours as needed for moderate pain.    [provider]    Allergies Morphine and Penicillins  No family history on file.  Social History Social History   Tobacco Use  . Smoking status: Never Smoker  . Smokeless tobacco: Never Used  Substance Use Topics  . Alcohol use: No  . Drug use: No    Review of Systems  Constitutional: No fever. Eyes: No redness. ENT: No neck pain. Cardiovascular: Denies chest pain. Respiratory: Denies shortness of breath. Gastrointestinal: Positive for vomiting.  Genitourinary: Negative for flank pain.  Musculoskeletal: Negative for back pain. Skin: Negative for rash. Neurological: Positive for intermittent headache.   ____________________________________________   PHYSICAL EXAM:  VITAL SIGNS: ED Triage Vitals  Enc Vitals Group     BP 02/23/18 0132 (!) 203/89     Pulse Rate 02/23/18 0132 74     Resp 02/23/18 0132 (!) 21     Temp 02/23/18 0132 97.9 F (36.6 C)     Temp Source 02/23/18 0132 Oral     SpO2 02/23/18 0132 95 %     Weight 02/23/18 0133 146 lb 13.2 oz (66.6 kg)     Height 02/23/18 0133 4\' 9"  (1.448 m)     Head Circumference --      Peak Flow --      Pain Score 02/23/18 0133 0     Pain Loc --      Pain Edu? --      Excl. in GC? --     Constitutional: Alert and oriented.  Uncomfortable appearing but in no acute distress. Eyes: Conjunctivae are normal.  EOMI.  PERRLA. Head:  Atraumatic. Nose: No congestion/rhinnorhea. Mouth/Throat: Mucous membranes are dry.   Neck: Normal range of motion.  Cardiovascular: Normal rate, regular rhythm. Grossly normal heart sounds.  Good peripheral circulation. Respiratory: Normal respiratory effort.  No retractions. Lungs CTAB. Gastrointestinal: Soft with no focal tenderness. No distention.  Genitourinary: No flank tenderness. Musculoskeletal: Extremities warm and well perfused.  Neurologic:  Normal speech.  Motor intact in all extremities.  Normal coordination.  No gross focal neurologic deficits are appreciated.  Skin:  Skin is warm and dry. No rash noted. Psychiatric: Mood and affect are normal.   ____________________________________________   LABS (all labs ordered are listed, but only abnormal results are displayed)  Labs Reviewed  COMPREHENSIVE METABOLIC PANEL - Abnormal; Notable for the following components:      Result Value   Sodium 133 (*)    Chloride  97 (*)    Glucose, Bld 230 (*)    GFR calc non Af Amer 54 (*)    All other components within normal limits  CBC WITH DIFFERENTIAL/PLATELET - Abnormal; Notable for the following components:   WBC 13.4 (*)    RBC 3.35 (*)    Hemoglobin 9.8 (*)    HCT 28.8 (*)    Neutro Abs 11.2 (*)    All other components within normal limits  URINALYSIS, COMPLETE (UACMP) WITH MICROSCOPIC - Abnormal; Notable for the following components:   Color, Urine YELLOW (*)    APPearance CLEAR (*)    Glucose, UA 50 (*)    Protein, ur 100 (*)    Leukocytes, UA TRACE (*)    Bacteria, UA RARE (*)    All other components within normal limits  TROPONIN I  LIPASE, BLOOD  PROTIME-INR   ____________________________________________  EKG  ED ECG REPORT I, Dionne BucySebastian Shaquille Janes, the attending physician, personally viewed and interpreted this ECG.  Date: 02/23/2018 EKG Time: 0128 Rate: 77 Rhythm: Paced rhythm QRS Axis: normal Intervals: normal ST/T Wave abnormalities: Nonspecific  lateral T wave abnormalities Narrative Interpretation: no evidence of acute ischemia; no significant change when compared to EKG of 02/13/2018  ____________________________________________  RADIOLOGY  CT head: Acute on subacute left frontoparietal subdural hemorrhage with midline shift. ____________________________________________   PROCEDURES  Procedure(s) performed: No  Procedures  Critical Care performed: Yes  CRITICAL CARE Performed by: Dionne BucySebastian Taivon Haroon   Total critical care time: 30 minutes  Critical care time was exclusive of separately billable procedures and treating other patients.  Critical care was necessary to treat or prevent imminent or life-threatening deterioration.  Critical care was time spent personally by me on the following activities: development of treatment plan with patient and/or surrogate as well as nursing, discussions with consultants, evaluation of patient's response to treatment, examination of patient, obtaining history from patient or surrogate, ordering and performing treatments and interventions, ordering and review of laboratory studies, ordering and review of radiographic studies, pulse oximetry and re-evaluation of patient's condition.  ____________________________________________   INITIAL IMPRESSION / ASSESSMENT AND PLAN / ED COURSE  Pertinent labs & imaging results that were available during my care of the patient were reviewed by me and considered in my medical decision making (see chart for details).  84 year old female with PMH as noted above and status post recent admission for intracranial hemorrhage presents with intermittent headache over the course the day today and generally feeling unwell, and then vomiting after her dinner this evening.  On exam, the patient is tired appearing but alert, and she is hypertensive but her other vital signs are normal.  Neuro exam is nonfocal.  There is no evidence of trauma.  The abdomen is  soft with no focal tenderness.  The headache and vomiting are somewhat concerning for recurrent intracranial hemorrhage.  Differential also includes gastritis, acute gastroenteritis, UTI, or less likely other intra-abdominal cause.  Plan: CT head, BP control, labs, fluids, and reassess.  ----------------------------------------- 4:07 AM on 02/23/2018 -----------------------------------------  CT shows new acute bleeding with acute midline shift on top of the prior subdural hemorrhage.  The patient's clinical status remains unchanged.  She is alert and communicative.  Neuro exam is nonfocal.  [facility] and the [facility] is accepted for transfer.  At this time there is no evidence of airway compromise or indication for emergent intubation.  Blood pressure has been improved with labetalol.  The patient is stable for transfer.  She  was accepted by Dr. Shirlee More.  I counseled the patient and her family members about the results of the imaging and the plan of care.   ____________________________________________   FINAL CLINICAL IMPRESSION(S) / ED DIAGNOSES  Final diagnoses:  Subdural hematoma (HCC)  Non-intractable vomiting, presence of nausea not specified, unspecified vomiting type      NEW MEDICATIONS STARTED DURING THIS VISIT:  Discharge Medication List as of 02/23/2018  4:55 AM       Note:  This document was prepared using Dragon voice recognition software and may include unintentional dictation errors.    Dionne Bucy, MD 02/23/18 [phone]

## 2018-03-02 DIAGNOSIS — R279 Unspecified lack of coordination: Secondary | ICD-10-CM | POA: Diagnosis not present

## 2018-03-02 DIAGNOSIS — M1990 Unspecified osteoarthritis, unspecified site: Secondary | ICD-10-CM | POA: Diagnosis present

## 2018-03-02 DIAGNOSIS — I2510 Atherosclerotic heart disease of native coronary artery without angina pectoris: Secondary | ICD-10-CM | POA: Diagnosis not present

## 2018-03-02 DIAGNOSIS — J439 Emphysema, unspecified: Secondary | ICD-10-CM | POA: Diagnosis not present

## 2018-03-02 DIAGNOSIS — K2940 Chronic atrophic gastritis without bleeding: Secondary | ICD-10-CM | POA: Diagnosis not present

## 2018-03-02 DIAGNOSIS — K55069 Acute infarction of intestine, part and extent unspecified: Secondary | ICD-10-CM | POA: Diagnosis not present

## 2018-03-02 DIAGNOSIS — K311 Adult hypertrophic pyloric stenosis: Secondary | ICD-10-CM | POA: Diagnosis not present

## 2018-03-02 DIAGNOSIS — K56609 Unspecified intestinal obstruction, unspecified as to partial versus complete obstruction: Secondary | ICD-10-CM | POA: Diagnosis not present

## 2018-03-02 DIAGNOSIS — I25810 Atherosclerosis of coronary artery bypass graft(s) without angina pectoris: Secondary | ICD-10-CM | POA: Diagnosis not present

## 2018-03-02 DIAGNOSIS — D649 Anemia, unspecified: Secondary | ICD-10-CM | POA: Diagnosis not present

## 2018-03-02 DIAGNOSIS — M810 Age-related osteoporosis without current pathological fracture: Secondary | ICD-10-CM | POA: Diagnosis present

## 2018-03-02 DIAGNOSIS — E119 Type 2 diabetes mellitus without complications: Secondary | ICD-10-CM | POA: Diagnosis not present

## 2018-03-02 DIAGNOSIS — I5032 Chronic diastolic (congestive) heart failure: Secondary | ICD-10-CM | POA: Diagnosis not present

## 2018-03-02 DIAGNOSIS — J159 Unspecified bacterial pneumonia: Secondary | ICD-10-CM | POA: Diagnosis not present

## 2018-03-02 DIAGNOSIS — I4891 Unspecified atrial fibrillation: Secondary | ICD-10-CM | POA: Diagnosis not present

## 2018-03-02 DIAGNOSIS — I10 Essential (primary) hypertension: Secondary | ICD-10-CM | POA: Diagnosis not present

## 2018-03-02 DIAGNOSIS — E872 Acidosis: Secondary | ICD-10-CM | POA: Diagnosis not present

## 2018-03-02 DIAGNOSIS — I6200 Nontraumatic subdural hemorrhage, unspecified: Secondary | ICD-10-CM | POA: Diagnosis not present

## 2018-03-02 DIAGNOSIS — R112 Nausea with vomiting, unspecified: Secondary | ICD-10-CM | POA: Diagnosis not present

## 2018-03-02 DIAGNOSIS — N179 Acute kidney failure, unspecified: Secondary | ICD-10-CM | POA: Diagnosis not present

## 2018-03-02 DIAGNOSIS — Z9181 History of falling: Secondary | ICD-10-CM | POA: Diagnosis not present

## 2018-03-02 DIAGNOSIS — I69328 Other speech and language deficits following cerebral infarction: Secondary | ICD-10-CM | POA: Diagnosis not present

## 2018-03-02 DIAGNOSIS — S065X9A Traumatic subdural hemorrhage with loss of consciousness of unspecified duration, initial encounter: Secondary | ICD-10-CM | POA: Diagnosis not present

## 2018-03-02 DIAGNOSIS — F329 Major depressive disorder, single episode, unspecified: Secondary | ICD-10-CM | POA: Diagnosis not present

## 2018-03-02 DIAGNOSIS — Z8673 Personal history of transient ischemic attack (TIA), and cerebral infarction without residual deficits: Secondary | ICD-10-CM | POA: Diagnosis not present

## 2018-03-02 DIAGNOSIS — E785 Hyperlipidemia, unspecified: Secondary | ICD-10-CM | POA: Diagnosis present

## 2018-03-02 DIAGNOSIS — Z9049 Acquired absence of other specified parts of digestive tract: Secondary | ICD-10-CM | POA: Diagnosis not present

## 2018-03-02 DIAGNOSIS — E7800 Pure hypercholesterolemia, unspecified: Secondary | ICD-10-CM | POA: Diagnosis present

## 2018-03-02 DIAGNOSIS — I509 Heart failure, unspecified: Secondary | ICD-10-CM | POA: Diagnosis not present

## 2018-03-02 DIAGNOSIS — E1143 Type 2 diabetes mellitus with diabetic autonomic (poly)neuropathy: Secondary | ICD-10-CM | POA: Diagnosis present

## 2018-03-02 DIAGNOSIS — R0600 Dyspnea, unspecified: Secondary | ICD-10-CM | POA: Diagnosis not present

## 2018-03-02 DIAGNOSIS — Z950 Presence of cardiac pacemaker: Secondary | ICD-10-CM | POA: Diagnosis not present

## 2018-03-02 DIAGNOSIS — J189 Pneumonia, unspecified organism: Secondary | ICD-10-CM | POA: Diagnosis not present

## 2018-03-02 DIAGNOSIS — S065X0D Traumatic subdural hemorrhage without loss of consciousness, subsequent encounter: Secondary | ICD-10-CM | POA: Diagnosis not present

## 2018-03-02 DIAGNOSIS — E7849 Other hyperlipidemia: Secondary | ICD-10-CM | POA: Diagnosis not present

## 2018-03-02 DIAGNOSIS — Z515 Encounter for palliative care: Secondary | ICD-10-CM | POA: Diagnosis not present

## 2018-03-02 DIAGNOSIS — I110 Hypertensive heart disease with heart failure: Secondary | ICD-10-CM | POA: Diagnosis not present

## 2018-03-02 DIAGNOSIS — I714 Abdominal aortic aneurysm, without rupture: Secondary | ICD-10-CM | POA: Diagnosis not present

## 2018-03-02 DIAGNOSIS — Z833 Family history of diabetes mellitus: Secondary | ICD-10-CM | POA: Diagnosis not present

## 2018-03-02 DIAGNOSIS — I482 Chronic atrial fibrillation: Secondary | ICD-10-CM | POA: Diagnosis not present

## 2018-03-02 DIAGNOSIS — E1165 Type 2 diabetes mellitus with hyperglycemia: Secondary | ICD-10-CM | POA: Diagnosis not present

## 2018-03-02 DIAGNOSIS — G9341 Metabolic encephalopathy: Secondary | ICD-10-CM | POA: Diagnosis not present

## 2018-03-02 DIAGNOSIS — I6202 Nontraumatic subacute subdural hemorrhage: Secondary | ICD-10-CM | POA: Diagnosis not present

## 2018-03-02 DIAGNOSIS — R5381 Other malaise: Secondary | ICD-10-CM | POA: Diagnosis not present

## 2018-03-02 DIAGNOSIS — M6281 Muscle weakness (generalized): Secondary | ICD-10-CM | POA: Diagnosis not present

## 2018-03-02 DIAGNOSIS — I959 Hypotension, unspecified: Secondary | ICD-10-CM | POA: Diagnosis present

## 2018-03-02 DIAGNOSIS — W19XXXD Unspecified fall, subsequent encounter: Secondary | ICD-10-CM | POA: Diagnosis not present

## 2018-03-02 DIAGNOSIS — Z951 Presence of aortocoronary bypass graft: Secondary | ICD-10-CM | POA: Diagnosis not present

## 2018-03-02 DIAGNOSIS — K3184 Gastroparesis: Secondary | ICD-10-CM | POA: Diagnosis present

## 2018-03-02 DIAGNOSIS — R0902 Hypoxemia: Secondary | ICD-10-CM | POA: Diagnosis not present

## 2018-03-02 DIAGNOSIS — G9340 Encephalopathy, unspecified: Secondary | ICD-10-CM | POA: Diagnosis not present

## 2018-03-02 DIAGNOSIS — J441 Chronic obstructive pulmonary disease with (acute) exacerbation: Secondary | ICD-10-CM | POA: Diagnosis not present

## 2018-03-02 DIAGNOSIS — Z66 Do not resuscitate: Secondary | ICD-10-CM | POA: Diagnosis not present

## 2018-03-02 DIAGNOSIS — E1151 Type 2 diabetes mellitus with diabetic peripheral angiopathy without gangrene: Secondary | ICD-10-CM | POA: Diagnosis present

## 2018-03-04 DIAGNOSIS — K311 Adult hypertrophic pyloric stenosis: Secondary | ICD-10-CM | POA: Diagnosis not present

## 2018-03-04 DIAGNOSIS — W19XXXD Unspecified fall, subsequent encounter: Secondary | ICD-10-CM | POA: Diagnosis not present

## 2018-03-04 DIAGNOSIS — I25810 Atherosclerosis of coronary artery bypass graft(s) without angina pectoris: Secondary | ICD-10-CM | POA: Diagnosis not present

## 2018-03-04 DIAGNOSIS — S065X0D Traumatic subdural hemorrhage without loss of consciousness, subsequent encounter: Secondary | ICD-10-CM | POA: Diagnosis not present

## 2018-03-04 DIAGNOSIS — Z9181 History of falling: Secondary | ICD-10-CM | POA: Diagnosis not present

## 2018-03-04 DIAGNOSIS — I2510 Atherosclerotic heart disease of native coronary artery without angina pectoris: Secondary | ICD-10-CM | POA: Diagnosis not present

## 2018-03-04 DIAGNOSIS — I509 Heart failure, unspecified: Secondary | ICD-10-CM | POA: Diagnosis not present

## 2018-03-04 DIAGNOSIS — I6200 Nontraumatic subdural hemorrhage, unspecified: Secondary | ICD-10-CM | POA: Diagnosis not present

## 2018-03-04 DIAGNOSIS — I482 Chronic atrial fibrillation: Secondary | ICD-10-CM | POA: Diagnosis not present

## 2018-03-04 DIAGNOSIS — F329 Major depressive disorder, single episode, unspecified: Secondary | ICD-10-CM | POA: Diagnosis not present

## 2018-03-04 DIAGNOSIS — I10 Essential (primary) hypertension: Secondary | ICD-10-CM | POA: Diagnosis not present

## 2018-03-04 DIAGNOSIS — J189 Pneumonia, unspecified organism: Secondary | ICD-10-CM | POA: Diagnosis not present

## 2018-03-04 DIAGNOSIS — I69328 Other speech and language deficits following cerebral infarction: Secondary | ICD-10-CM | POA: Diagnosis not present

## 2018-03-04 DIAGNOSIS — E119 Type 2 diabetes mellitus without complications: Secondary | ICD-10-CM | POA: Diagnosis not present

## 2018-03-04 DIAGNOSIS — I4891 Unspecified atrial fibrillation: Secondary | ICD-10-CM | POA: Diagnosis not present

## 2018-03-04 DIAGNOSIS — I5032 Chronic diastolic (congestive) heart failure: Secondary | ICD-10-CM | POA: Diagnosis not present

## 2018-03-04 DIAGNOSIS — I110 Hypertensive heart disease with heart failure: Secondary | ICD-10-CM | POA: Diagnosis not present

## 2018-03-06 ENCOUNTER — Emergency Department: Payer: Medicare HMO

## 2018-03-06 ENCOUNTER — Inpatient Hospital Stay
Admission: EM | Admit: 2018-03-06 | Discharge: 2018-03-13 | DRG: 393 | Disposition: E | Payer: Medicare HMO | Attending: Internal Medicine | Admitting: Internal Medicine

## 2018-03-06 ENCOUNTER — Other Ambulatory Visit: Payer: Self-pay

## 2018-03-06 DIAGNOSIS — K3184 Gastroparesis: Secondary | ICD-10-CM | POA: Diagnosis present

## 2018-03-06 DIAGNOSIS — I2510 Atherosclerotic heart disease of native coronary artery without angina pectoris: Secondary | ICD-10-CM | POA: Diagnosis present

## 2018-03-06 DIAGNOSIS — R0902 Hypoxemia: Secondary | ICD-10-CM | POA: Diagnosis present

## 2018-03-06 DIAGNOSIS — M1990 Unspecified osteoarthritis, unspecified site: Secondary | ICD-10-CM | POA: Diagnosis present

## 2018-03-06 DIAGNOSIS — E872 Acidosis: Secondary | ICD-10-CM | POA: Diagnosis present

## 2018-03-06 DIAGNOSIS — I959 Hypotension, unspecified: Secondary | ICD-10-CM | POA: Diagnosis present

## 2018-03-06 DIAGNOSIS — Z9049 Acquired absence of other specified parts of digestive tract: Secondary | ICD-10-CM

## 2018-03-06 DIAGNOSIS — I10 Essential (primary) hypertension: Secondary | ICD-10-CM | POA: Diagnosis not present

## 2018-03-06 DIAGNOSIS — M810 Age-related osteoporosis without current pathological fracture: Secondary | ICD-10-CM | POA: Diagnosis present

## 2018-03-06 DIAGNOSIS — E1151 Type 2 diabetes mellitus with diabetic peripheral angiopathy without gangrene: Secondary | ICD-10-CM | POA: Diagnosis present

## 2018-03-06 DIAGNOSIS — Z8673 Personal history of transient ischemic attack (TIA), and cerebral infarction without residual deficits: Secondary | ICD-10-CM

## 2018-03-06 DIAGNOSIS — Z515 Encounter for palliative care: Secondary | ICD-10-CM | POA: Diagnosis not present

## 2018-03-06 DIAGNOSIS — I714 Abdominal aortic aneurysm, without rupture: Secondary | ICD-10-CM | POA: Diagnosis not present

## 2018-03-06 DIAGNOSIS — I6200 Nontraumatic subdural hemorrhage, unspecified: Secondary | ICD-10-CM | POA: Diagnosis not present

## 2018-03-06 DIAGNOSIS — E1143 Type 2 diabetes mellitus with diabetic autonomic (poly)neuropathy: Secondary | ICD-10-CM | POA: Diagnosis present

## 2018-03-06 DIAGNOSIS — Z833 Family history of diabetes mellitus: Secondary | ICD-10-CM

## 2018-03-06 DIAGNOSIS — Z7901 Long term (current) use of anticoagulants: Secondary | ICD-10-CM

## 2018-03-06 DIAGNOSIS — K55069 Acute infarction of intestine, part and extent unspecified: Secondary | ICD-10-CM | POA: Diagnosis not present

## 2018-03-06 DIAGNOSIS — I110 Hypertensive heart disease with heart failure: Secondary | ICD-10-CM | POA: Diagnosis present

## 2018-03-06 DIAGNOSIS — E7800 Pure hypercholesterolemia, unspecified: Secondary | ICD-10-CM | POA: Diagnosis present

## 2018-03-06 DIAGNOSIS — I4891 Unspecified atrial fibrillation: Secondary | ICD-10-CM | POA: Diagnosis present

## 2018-03-06 DIAGNOSIS — E785 Hyperlipidemia, unspecified: Secondary | ICD-10-CM | POA: Diagnosis present

## 2018-03-06 DIAGNOSIS — N179 Acute kidney failure, unspecified: Secondary | ICD-10-CM | POA: Diagnosis present

## 2018-03-06 DIAGNOSIS — Z8249 Family history of ischemic heart disease and other diseases of the circulatory system: Secondary | ICD-10-CM

## 2018-03-06 DIAGNOSIS — S065X9A Traumatic subdural hemorrhage with loss of consciousness of unspecified duration, initial encounter: Secondary | ICD-10-CM | POA: Diagnosis not present

## 2018-03-06 DIAGNOSIS — E860 Dehydration: Secondary | ICD-10-CM | POA: Diagnosis present

## 2018-03-06 DIAGNOSIS — G9341 Metabolic encephalopathy: Secondary | ICD-10-CM | POA: Diagnosis present

## 2018-03-06 DIAGNOSIS — Z66 Do not resuscitate: Secondary | ICD-10-CM | POA: Diagnosis present

## 2018-03-06 DIAGNOSIS — G9340 Encephalopathy, unspecified: Secondary | ICD-10-CM

## 2018-03-06 DIAGNOSIS — I358 Other nonrheumatic aortic valve disorders: Secondary | ICD-10-CM | POA: Diagnosis present

## 2018-03-06 DIAGNOSIS — Z950 Presence of cardiac pacemaker: Secondary | ICD-10-CM

## 2018-03-06 DIAGNOSIS — K56609 Unspecified intestinal obstruction, unspecified as to partial versus complete obstruction: Secondary | ICD-10-CM | POA: Diagnosis not present

## 2018-03-06 DIAGNOSIS — I509 Heart failure, unspecified: Secondary | ICD-10-CM | POA: Diagnosis present

## 2018-03-06 DIAGNOSIS — R112 Nausea with vomiting, unspecified: Secondary | ICD-10-CM | POA: Diagnosis not present

## 2018-03-06 DIAGNOSIS — Z885 Allergy status to narcotic agent status: Secondary | ICD-10-CM

## 2018-03-06 DIAGNOSIS — Z7984 Long term (current) use of oral hypoglycemic drugs: Secondary | ICD-10-CM

## 2018-03-06 DIAGNOSIS — Z951 Presence of aortocoronary bypass graft: Secondary | ICD-10-CM | POA: Diagnosis not present

## 2018-03-06 DIAGNOSIS — Z880 Allergy status to penicillin: Secondary | ICD-10-CM

## 2018-03-06 DIAGNOSIS — M6281 Muscle weakness (generalized): Secondary | ICD-10-CM | POA: Diagnosis not present

## 2018-03-06 DIAGNOSIS — I6202 Nontraumatic subacute subdural hemorrhage: Secondary | ICD-10-CM | POA: Diagnosis not present

## 2018-03-06 DIAGNOSIS — R0600 Dyspnea, unspecified: Secondary | ICD-10-CM | POA: Diagnosis not present

## 2018-03-06 DIAGNOSIS — Z7951 Long term (current) use of inhaled steroids: Secondary | ICD-10-CM

## 2018-03-06 LAB — CBC WITH DIFFERENTIAL/PLATELET
BASOS PCT: 0 %
Basophils Absolute: 0.1 10*3/uL (ref 0–0.1)
EOS ABS: 0 10*3/uL (ref 0–0.7)
Eosinophils Relative: 0 %
HCT: 29.4 % — ABNORMAL LOW (ref 35.0–47.0)
HEMOGLOBIN: 9.8 g/dL — AB (ref 12.0–16.0)
LYMPHS ABS: 0.9 10*3/uL — AB (ref 1.0–3.6)
Lymphocytes Relative: 3 %
MCH: 28.3 pg (ref 26.0–34.0)
MCHC: 33.2 g/dL (ref 32.0–36.0)
MCV: 85.3 fL (ref 80.0–100.0)
MONOS PCT: 5 %
Monocytes Absolute: 1.9 10*3/uL — ABNORMAL HIGH (ref 0.2–0.9)
NEUTROS PCT: 92 %
Neutro Abs: 35.5 10*3/uL — ABNORMAL HIGH (ref 1.4–6.5)
Platelets: 332 10*3/uL (ref 150–440)
RBC: 3.45 MIL/uL — AB (ref 3.80–5.20)
RDW: 15.6 % — ABNORMAL HIGH (ref 11.5–14.5)
WBC: 38.4 10*3/uL — ABNORMAL HIGH (ref 3.6–11.0)

## 2018-03-06 LAB — COMPREHENSIVE METABOLIC PANEL
ALBUMIN: 2.8 g/dL — AB (ref 3.5–5.0)
ALK PHOS: 110 U/L (ref 38–126)
ALT: 14 U/L (ref 0–44)
AST: 24 U/L (ref 15–41)
Anion gap: 13 (ref 5–15)
BILIRUBIN TOTAL: 0.8 mg/dL (ref 0.3–1.2)
BUN: 40 mg/dL — ABNORMAL HIGH (ref 8–23)
CALCIUM: 8.9 mg/dL (ref 8.9–10.3)
CO2: 29 mmol/L (ref 22–32)
CREATININE: 1.25 mg/dL — AB (ref 0.44–1.00)
Chloride: 94 mmol/L — ABNORMAL LOW (ref 98–111)
GFR calc Af Amer: 42 mL/min — ABNORMAL LOW (ref >60)
GFR calc non Af Amer: 36 mL/min — ABNORMAL LOW (ref >60)
GLUCOSE: 238 mg/dL — AB (ref 70–99)
Potassium: 3.6 mmol/L (ref 3.5–5.1)
SODIUM: 136 mmol/L (ref 135–145)
TOTAL PROTEIN: 7.1 g/dL (ref 6.5–8.1)

## 2018-03-06 LAB — URINALYSIS, COMPLETE (UACMP) WITH MICROSCOPIC
BACTERIA UA: NONE SEEN
Bilirubin Urine: NEGATIVE
Glucose, UA: NEGATIVE mg/dL
Hgb urine dipstick: NEGATIVE
Ketones, ur: NEGATIVE mg/dL
Nitrite: NEGATIVE
PROTEIN: 100 mg/dL — AB
RBC / HPF: >50 RBC/hpf — ABNORMAL HIGH (ref 0–5)
SPECIFIC GRAVITY, URINE: 1.025 (ref 1.005–1.030)
pH: 5 (ref 5.0–8.0)

## 2018-03-06 LAB — MRSA PCR SCREENING: MRSA BY PCR: NEGATIVE

## 2018-03-06 LAB — TROPONIN I: Troponin I: 0.05 ng/mL (ref <0.03)

## 2018-03-06 LAB — LACTIC ACID, PLASMA: Lactic Acid, Venous: 2.6 mmol/L (ref 0.5–1.9)

## 2018-03-06 MED ORDER — BENZOCAINE 20 % MT SOLN
OROMUCOSAL | Status: AC
  Filled 2018-03-06: qty 5

## 2018-03-06 MED ORDER — IOHEXOL 300 MG/ML  SOLN
75.0000 mL | Freq: Once | INTRAMUSCULAR | Status: AC | PRN
  Administered 2018-03-06: 75 mL via INTRAVENOUS

## 2018-03-06 MED ORDER — SODIUM CHLORIDE 0.9 % IV SOLN
2.0000 g | Freq: Once | INTRAVENOUS | Status: AC
  Administered 2018-03-06: 2 g via INTRAVENOUS
  Filled 2018-03-06: qty 2

## 2018-03-06 MED ORDER — ACETAMINOPHEN 650 MG RE SUPP: 650.0000 mg | Freq: Four times a day (QID) | RECTAL | Status: DC | PRN

## 2018-03-06 MED ORDER — ONDANSETRON HCL 4 MG PO TABS: 4.0000 mg | ORAL_TABLET | Freq: Four times a day (QID) | ORAL | Status: DC | PRN

## 2018-03-06 MED ORDER — ACETAMINOPHEN 325 MG PO TABS: 650.0000 mg | ORAL_TABLET | Freq: Four times a day (QID) | ORAL | Status: DC | PRN

## 2018-03-06 MED ORDER — VANCOMYCIN HCL IN DEXTROSE 1-5 GM/200ML-% IV SOLN
1000.0000 mg | Freq: Once | INTRAVENOUS | Status: AC
  Administered 2018-03-06: 1000 mg via INTRAVENOUS
  Filled 2018-03-06: qty 200

## 2018-03-06 MED ORDER — ONDANSETRON HCL 4 MG/2ML IJ SOLN
4.0000 mg | Freq: Four times a day (QID) | INTRAMUSCULAR | Status: DC | PRN
  Filled 2018-03-06: qty 2

## 2018-03-06 MED ORDER — SODIUM CHLORIDE 0.9 % IV BOLUS
500.0000 mL | Freq: Once | INTRAVENOUS | Status: AC
Start: 2018-03-06 — End: 2018-03-06
  Administered 2018-03-06: 500 mL via INTRAVENOUS

## 2018-03-06 MED ORDER — ALBUTEROL SULFATE (2.5 MG/3ML) 0.083% IN NEBU: 2.5000 mg | INHALATION_SOLUTION | RESPIRATORY_TRACT | Status: DC | PRN

## 2018-03-06 MED ORDER — SODIUM CHLORIDE 0.9 % IV BOLUS
500.0000 mL | Freq: Once | INTRAVENOUS | Status: AC
  Administered 2018-03-06: 500 mL via INTRAVENOUS

## 2018-03-06 NOTE — ED Notes (Signed)
Attempted to place ng tube, unsuccessful at this time. Dr. Roxan Hockeyobinson notified.

## 2018-03-06 NOTE — ED Notes (Signed)
Attempted to call report. Nurse unavailable to take report at this time.

## 2018-03-06 NOTE — ED Notes (Signed)
Date and time results received: 02/26/2018 19:40 (use smartphrase ".now" to insert current time)  Test: troponin Critical Value: 0.5  Name of Provider Notified: Dr. Roxan Hockeyobinson  Orders Received? Or Actions Taken?: acknowledged

## 2018-03-06 NOTE — Progress Notes (Signed)
CODE SEPSIS - PHARMACY COMMUNICATION  **Broad Spectrum Antibiotics should be administered within 1 hour of Sepsis diagnosis**  Time Code Sepsis Called/Page Received: 18:15  Antibiotics Ordered: Aztreonam and Vancomycin  Time of 1st antibiotic administration: Aztreonam given at 18:53  Additional action taken by pharmacy: n/a  If necessary, Name of Provider/Nurse Contacted: n/a    Foye DeerLisa G Shemika Robbs ,PharmD Clinical Pharmacist  03/10/2018  7:03 PM

## 2018-03-06 NOTE — ED Notes (Signed)
Admitting provider at bedside.

## 2018-03-06 NOTE — ED Provider Notes (Signed)
[facility] Emergency Department Provider Note    First MD Initiated Contact with Patient 02/25/2018 1814     (approximate)  I have reviewed the triage vital signs and the nursing notes.   HISTORY  Chief Complaint AMS  Level V Caveat:  Acute encephalopathy - presumed metabolic   HPI Veronica Short is a 84 y.o. female complex past medical history [facility] due to increasing altered mental status and confusion.  Has had decreased oral intake does endorse nausea and vomiting.  Has been having difficulty forming words as well as right-sided weakness since [facility].  Blood work was checked and showed white count of 20,000 yesterday.  EMS was called and found the patient to be hypoxic to low 80s.  Was also found to be mildly hypotensive.  Patient does follow simple commands on arrival to the ER and is protecting her airway.  Family did arrive at bedside states that they have noticed a significant decline in her clinical status over the past few days.  She is more alert with family at bedside smiling interacting with them.    Past Medical History:  Diagnosis Date  . Arthritis   . CAD (coronary artery disease)   . CHF (congestive heart failure) (HCC)   . Diabetes mellitus without complication (HCC)   . High cholesterol   . Hypertension    History reviewed. No pertinent family history. Past Surgical History:  Procedure Laterality Date  . CHOLECYSTECTOMY    . CORONARY ARTERY BYPASS GRAFT    . PACEMAKER INSERTION     Patient Active Problem List   Diagnosis Date Noted  . SBO (small bowel obstruction) (HCC) 03/12/2018  . CVA (cerebral vascular accident) (HCC) 10/06/2017  . Speech disorder 01/31/2015  . Closed 2-part nondisplaced fracture of surgical neck of left humerus 01/07/2015  . Rotator cuff arthropathy 01/07/2015  . Left rotator cuff tear arthropathy  01/07/2015  . TIA (transient ischemic attack) 01/03/2015  . CAD (coronary artery disease) 01/03/2015  . Diabetes mellitus without complication (HCC) 01/03/2015  . Hypertension 01/03/2015  . High cholesterol 01/03/2015  . Cerebral thrombosis with cerebral infarction (HCC) 01/03/2015  . Cerebral infarction due to thrombosis of cerebral artery (HCC) 01/03/2015  . Transient cerebral ischemia 01/03/2015  . Atrial fibrillation, unspecified   . Speech disturbance   . Osteoarthritis 03/23/2014  . Aortic valve disorder 11/20/2013  . CAD (coronary artery disease), native coronary artery 11/20/2013  . DDD (degenerative disc disease), lumbosacral 11/20/2013  . Obesity, unspecified 11/20/2013  . Osteoporosis 11/20/2013  . Type II or unspecified type diabetes mellitus without mention of complication, not stated as uncontrolled 11/20/2013  . Atrial fibrillation (HCC) 11/01/2013  . Other and unspecified hyperlipidemia 11/01/2013  . S/P CABG x 4 11/01/2013  . Pacemaker 02/22/2012  . H/O cardiac catheterization 11/10/1993  . MI (myocardial infarction) (HCC) 01/15/1991      Prior to Admission medications   Medication Sig Start Date End Date Taking? Authorizing Provider  acetaminophen (TYLENOL) 325 MG tablet Take 650 mg by mouth every 6 (six) hours as needed for mild pain.    Yes [provider]  albuterol (PROVENTIL HFA;VENTOLIN HFA) 108 (90 Base) MCG/ACT inhaler Inhale 2 puffs into the lungs every 6 (six) hours as needed for wheezing or shortness of breath. 10/27/15  Yes Leona Carryaylor, Linda M, MD  atenolol (TENORMIN) 50 MG tablet Take 50 mg by mouth daily.  10/14/13  Yes [provider]  atorvastatin (LIPITOR) 40 MG tablet Take 1 tablet (40 mg total) by mouth at bedtime. 01/04/15  Yes Tat, Onalee Hua, MD  CARTIA XT 180 MG 24 hr capsule Take 180 mg by mouth daily.  04/05/17  Yes [provider]  cefdinir (OMNICEF) 300 MG capsule Take 300 mg by mouth 2 (two) times daily. 02/14/2018 03/11/18 Yes  [provider]  DULoxetine (CYMBALTA) 30 MG capsule Take 30 mg by mouth daily.    Yes [provider]  fluticasone (FLONASE) 50 MCG/ACT nasal spray Place 2 sprays into both nostrils daily as needed for allergies.    Yes [provider]  furosemide (LASIX) 20 MG tablet Take 20 mg by mouth daily. 07/14/17  Yes [provider]  ipratropium (ATROVENT) 0.02 % nebulizer solution Take 0.5 mg by nebulization every 4 (four) hours as needed for wheezing or shortness of breath.   Yes [provider]  losartan (COZAAR) 25 MG tablet Take 25 mg by mouth daily.   Yes [provider]  Melatonin ([employer] MELATONIN) 3 MG TABS Take 3 mg by mouth at bedtime.   Yes [provider]  metFORMIN (GLUCOPHAGE-XR) 500 MG 24 hr tablet Take 500 mg by mouth daily with breakfast.  11/01/13  Yes [provider]  metoCLOPramide (REGLAN) 5 MG tablet Take 5 mg by mouth 3 (three) times daily.   Yes [provider]  pantoprazole (PROTONIX) 40 MG tablet Take 40 mg by mouth 2 (two) times daily.   Yes [provider]  apixaban (ELIQUIS) 5 MG TABS tablet Take 1 tablet (5 mg total) by mouth 2 (two) times daily. Patient not taking: Reported on 02/17/2018 01/04/15   Catarina Hartshorn, MD    Allergies Morphine and Penicillins    Social History Social History   Tobacco Use  . Smoking status: Never Smoker  . Smokeless tobacco: Never Used  Substance Use Topics  . Alcohol use: No  . Drug use: No    Review of Systems Patient denies headaches, rhinorrhea, blurry vision, numbness, shortness of breath, chest pain, edema, cough, abdominal pain, nausea, vomiting, diarrhea, dysuria, fevers, rashes or hallucinations unless otherwise stated above in HPI. ____________________________________________   PHYSICAL EXAM:  VITAL SIGNS: Vitals:   02/19/2018 1930 02/18/2018 2000  BP: (!) 116/55 (!) 121/56  Pulse:  90  Resp:  (!) 35  Temp:    SpO2:  95%     Constitutional: Alert , protecting her airway,  Smiling at family Eyes: Conjunctivae are normal.  Head: Atraumatic. Nose: No congestion/rhinnorhea. Mouth/Throat: Mucous membranes are moist.   Neck: No stridor. Painless ROM.  Cardiovascular: Normal rate, regular rhythm. Grossly normal heart sounds.  Good peripheral circulation. Respiratory: Normal respiratory effort.  No retractions. Lungs CTAB. Gastrointestinal: Soft and nontender. No distention. No abdominal bruits. No CVA tenderness. Genitourinary:  Musculoskeletal: No lower extremity tenderness nor edema.  No joint effusions. Neurologic: smiling, right sided weakness, unable to cooperate with full neuro examSkin:  Skin is warm, dry and intact. No rash noted. Psychiatric: unable to assess  ____________________________________________   LABS (all labs ordered are listed, but only abnormal results are displayed)  Results for orders placed or performed during the hospital encounter of 02/18/2018 (from the past 24 hour(s))  Lactic acid, plasma     Status: Abnormal   Collection Time: 02/22/2018  5:49 PM  Result Value Ref Range   Lactic Acid, Venous 2.6 (HH) 0.5 - 1.9 mmol/L  CBC with Differential/Platelet  Status: Abnormal   Collection Time: 2018/03/26  5:49 PM  Result Value Ref Range   WBC 38.4 (H) 3.6 - 11.0 K/uL   RBC 3.45 (L) 3.80 - 5.20 MIL/uL   Hemoglobin 9.8 (L) 12.0 - 16.0 g/dL   HCT 16.1 (L) 09.6 - 04.5 %   MCV 85.3 80.0 - 100.0 fL   MCH 28.3 26.0 - 34.0 pg   MCHC 33.2 32.0 - 36.0 g/dL   RDW 40.9 (H) 81.1 - 91.4 %   Platelets 332 150 - 440 K/uL   Neutrophils Relative % 92 %   Neutro Abs 35.5 (H) 1.4 - 6.5 K/uL   Lymphocytes Relative 3 %   Lymphs Abs 0.9 (L) 1.0 - 3.6 K/uL   Monocytes Relative 5 %   Monocytes Absolute 1.9 (H) 0.2 - 0.9 K/uL   Eosinophils Relative 0 %   Eosinophils Absolute 0.0 0 - 0.7 K/uL   Basophils Relative 0 %   Basophils Absolute 0.1 0 - 0.1 K/uL  Comprehensive metabolic panel     Status:  Abnormal   Collection Time: 03-26-18  5:49 PM  Result Value Ref Range   Sodium 136 135 - 145 mmol/L   Potassium 3.6 3.5 - 5.1 mmol/L   Chloride 94 (L) 98 - 111 mmol/L   CO2 29 22 - 32 mmol/L   Glucose, Bld 238 (H) 70 - 99 mg/dL   BUN 40 (H) 8 - 23 mg/dL   Creatinine, Ser 7.82 (H) 0.44 - 1.00 mg/dL   Calcium 8.9 8.9 - 95.6 mg/dL   Total Protein 7.1 6.5 - 8.1 g/dL   Albumin 2.8 (L) 3.5 - 5.0 g/dL   AST 24 15 - 41 U/L   ALT 14 0 - 44 U/L   Alkaline Phosphatase 110 38 - 126 U/L   Total Bilirubin 0.8 0.3 - 1.2 mg/dL   GFR calc non Af Amer 36 (L) >60 mL/min   GFR calc Af Amer 42 (L) >60 mL/min   Anion gap 13 5 - 15  Troponin I     Status: Abnormal   Collection Time: 03-26-2018  5:49 PM  Result Value Ref Range   Troponin I 0.05 (HH) <0.03 ng/mL  Urinalysis, Complete w Microscopic     Status: Abnormal   Collection Time: 26-Mar-2018  5:57 PM  Result Value Ref Range   Color, Urine AMBER (A) YELLOW   APPearance CLOUDY (A) CLEAR   Specific Gravity, Urine 1.025 1.005 - 1.030   pH 5.0 5.0 - 8.0   Glucose, UA NEGATIVE NEGATIVE mg/dL   Hgb urine dipstick NEGATIVE NEGATIVE   Bilirubin Urine NEGATIVE NEGATIVE   Ketones, ur NEGATIVE NEGATIVE mg/dL   Protein, ur 213 (A) NEGATIVE mg/dL   Nitrite NEGATIVE NEGATIVE   Leukocytes, UA MODERATE (A) NEGATIVE   RBC / HPF >50 (H) 0 - 5 RBC/hpf   WBC, UA 11-20 0 - 5 WBC/hpf   Bacteria, UA NONE SEEN NONE SEEN   Squamous Epithelial / LPF 11-20 0 - 5   Mucus PRESENT    Budding Yeast PRESENT   Blood gas, venous     Status: Abnormal   Collection Time: 03/26/18  6:28 PM  Result Value Ref Range   FIO2 PENDING    Delivery systems PENDING    pH, Ven 7.44 (H) 7.250 - 7.430   pCO2, Ven 43 (L) 44.0 - 60.0 mmHg   pO2, Ven 38.0 32.0 - 45.0 mmHg   Bicarbonate 29.2 (H) 20.0 - 28.0 mmol/L   Acid-Base Excess  4.6 (H) 0.0 - 2.0 mmol/L   O2 Saturation 74.6 %   Patient temperature 37.0    Collection site VENOUS    Sample type VENOUS     ____________________________________________  EKG My review and personal interpretation at Time: 17:48   Indication: ams  Rate: 85  Rhythm: a fib Axis: normal Other:  Nonspecific st abnormalities, no stemi ____________________________________________  RADIOLOGY  I personally reviewed all radiographic images ordered to evaluate for the above acute complaints and reviewed radiology reports and findings.  These findings were personally discussed with the patient.  Please see medical record for radiology report.  ____________________________________________   PROCEDURES  Procedure(s) performed:  .Critical Care Performed by: Willy Eddy, MD Authorized by: Willy Eddy, MD   Critical care provider statement:    Critical care time (minutes):  30   Critical care time was exclusive of:  Separately billable procedures and treating other patients   Critical care was necessary to treat or prevent imminent or life-threatening deterioration of the following conditions:  Dehydration   Critical care was time spent personally by me on the following activities:  Development of treatment plan with patient or surrogate, discussions with consultants, evaluation of patient's response to treatment, examination of patient, obtaining history from patient or surrogate, ordering and performing treatments and interventions, ordering and review of laboratory studies, ordering and review of radiographic studies, pulse oximetry, re-evaluation of patient's condition and review of old charts      Critical Care performed: yes  ____________________________________________   INITIAL IMPRESSION / ASSESSMENT AND PLAN / ED COURSE  Pertinent labs & imaging results that were available during my care of the patient were reviewed by me and considered in my medical decision making (see chart for details).   DDX: Dehydration, sepsis, pna, uti, hypoglycemia, cva, drug effect, withdrawal,  encephalitis   Maquita R Arseneault is a 84 y.o. who presents to the ED with symptoms as described above.  Patient with complex recent past medical history.  Does have a low-grade temperature does have evidence of respiratory failure requiring supplemental oxygen.  She is currently protecting her airway and does follow simple commands.  She arrives with the most form indicating that she would want minimal interventions and would not want to be intubated.  Family at bedside confirm this.  Blood will be sent for the above differential.  Will provide IV hydration as she does appear clinically dehydrated.  Does have a distended abdomen therefore will order CT imaging of the abdomen to evaluate for obstructive pattern.  T imaging of the head will also be ordered to recent bleed and change in mental status.  Clinical Course as of Mar 07 2035  Wynelle Link 03-29-2018  1850 Given white count of 30,000 and low-grade temperature will start empiric antibiotics she is coming from nursing facility.   [PR]  1941 CT imaging does show evidence of small bowel obstruction.  Presentation is comp located by the fact the patient now has evidence of SMA thrombus and as she was just recently diagnosed with worsening subdural hematoma this is certainly a very poor prognostic indicator.   [PR]  2001 Had extensive conversation with patient and family at bedside.  Discussed the very poor prognosis and her complex presentation.  I discussed high risk nature of her small bowel obstruction and after discussion with Dr. Everlene Farrier of general surgery would not be a surgical candidate at this time.  Discussed therapeutic options including NG decompression for symptom medic management which family agrees  with trying.  Patient states she would not want any invasive intervention regarding subdural hematoma.  At this point believe the patient would benefit from hospitalization for comfort measures as well as NG tube and palliative care consultation.   [PR]   2031 After 3 attempts patient was unable to tolerate NG tube in.  At this point I do feel that were causing more discomfort.  We will hold off on any additional attempts at this point.  She does not appear in any discomfort while at rest.  We will continue to monitor patient.   [PR]    Clinical Course User Index [PR] Willy Eddy, MD     As part of my medical decision making, I reviewed the following data within the electronic MEDICAL RECORD NUMBER Nursing notes reviewed and incorporated, Labs reviewed, notes from prior ED visits.   ____________________________________________   FINAL CLINICAL IMPRESSION(S) / ED DIAGNOSES  Final diagnoses:  Small bowel obstruction (HCC)  Intractable vomiting with nausea, unspecified vomiting type  Acute encephalopathy  Mesenteric artery thrombosis (HCC)      NEW MEDICATIONS STARTED DURING THIS VISIT:  New Prescriptions   No medications on file     Note:  This document was prepared using Dragon voice recognition software and may include unintentional dictation errors.    Willy Eddy, MD 02/10/2018 2036

## 2018-03-06 NOTE — ED Notes (Signed)
[facility]. Dr. Roxan Hockeyobinson notified.

## 2018-03-06 NOTE — ED Triage Notes (Signed)
Pt arrives via [facility] from [facility]. EMS reports they were called to scene for difficulty breathing when they arrived pt shallow respirations and sats 80's. Pt arrives minimally responsive, Dr. Roxan Hockeyobinson bedside.

## 2018-03-06 NOTE — H&P (Signed)
[employer] [facility] [place] at [facility]   PATIENT NAME: Veronica Short    MR#:  [id number]  DATE OF BIRTH:  August 07, 1923  DATE OF ADMISSION:  2018-03-25  PRIMARY CARE PHYSICIAN: Kandyce Rud, MD   REQUESTING/REFERRING PHYSICIAN: Dr. Roxan Hockey.  CHIEF COMPLAINT:  No chief complaint on file.  Altered mental status. HISTORY OF PRESENT ILLNESS:  Veronica Short  is a 84 y.o. female with a known history of arthritis, CAD, CHF, hypertension, diabetes and hyperlipidemia.  The patient was recently admitted at [facility] for subdural hematoma.  [facility] to increasing altered mental status and confusion.  She has had  decreased oral intake, nausea and vomiting.  She was found hypoxia at low 80s, hypotensive and leukocytosis.  Family reported that the patient has significant decline in her status in the past few days. CT imaging does show evidence of small bowel obstruction and  has evidence of SMA thrombus.  However the patient was recently diagnosed with a worsening subdural hematoma.  She has very poor prognosis.  Dr. Roxan Hockey had extensive conversation with the patient's family at bedside.  The patient family decided NGT and comfort care. PAST MEDICAL HISTORY:   Past Medical History:  Diagnosis Date  . Arthritis   . CAD (coronary artery disease)   . CHF (congestive heart failure) (HCC)   . Diabetes mellitus without complication (HCC)   . High cholesterol   . Hypertension     PAST SURGICAL HISTORY:   Past Surgical History:  Procedure Laterality Date  . CHOLECYSTECTOMY    . CORONARY ARTERY BYPASS GRAFT    . PACEMAKER INSERTION      SOCIAL HISTORY:   Social History   Tobacco Use  . Smoking status: Never Smoker  . Smokeless tobacco: Never Used  Substance Use Topics  . Alcohol use: No    FAMILY HISTORY:  History reviewed. No pertinent family history.  Father: diabetes.  Mother: Diabetes and heart attack.  DRUG ALLERGIES:    Allergies  Allergen Reactions  . Morphine Anxiety    NUMBNESS  . Penicillins Hives and Rash    Has patient had a PCN reaction causing immediate rash, facial/tongue/throat swelling, SOB or lightheadedness with hypotension: No Has patient had a PCN reaction causing severe rash involving mucus membranes or skin necrosis: No Has patient had a PCN reaction that required hospitalization: No Has patient had a PCN reaction occurring within the last 10 years: No If all of the above answers are "NO", then may proceed with Cephalosporin use.     REVIEW OF SYSTEMS:   Review of Systems  Unable to perform ROS: Mental status change    MEDICATIONS AT HOME:   Prior to Admission medications   Medication Sig Start Date End Date Taking? Authorizing Provider  acetaminophen (TYLENOL) 325 MG tablet Take 650 mg by mouth every 6 (six) hours as needed for mild pain.    Yes [provider]  albuterol (PROVENTIL HFA;VENTOLIN HFA) 108 (90 Base) MCG/ACT inhaler Inhale 2 puffs into the lungs every 6 (six) hours as needed for wheezing or shortness of breath. 10/27/15  Yes Leona Carry, MD  atenolol (TENORMIN) 50 MG tablet Take 50 mg by mouth daily.  10/14/13  Yes [provider]  atorvastatin (LIPITOR) 40 MG tablet Take 1 tablet (40 mg total) by mouth at bedtime. 01/04/15  Yes Tat, Onalee Hua, MD  CARTIA XT 180 MG 24 hr capsule Take 180 mg by mouth daily.  04/05/17  Yes [provider]  cefdinir (OMNICEF) 300 MG capsule Take 300 mg by mouth 2 (two) times daily. 03/09/2018 03/11/18 Yes [provider]  DULoxetine (CYMBALTA) 30 MG capsule Take 30 mg by mouth daily.    Yes [provider]  fluticasone (FLONASE) 50 MCG/ACT nasal spray Place 2 sprays into both nostrils daily as needed for allergies.    Yes [provider]  furosemide (LASIX) 20 MG tablet Take 20 mg by mouth daily. 07/14/17  Yes [provider]  ipratropium (ATROVENT) 0.02 % nebulizer solution Take 0.5  mg by nebulization every 4 (four) hours as needed for wheezing or shortness of breath.   Yes [provider]  losartan (COZAAR) 25 MG tablet Take 25 mg by mouth daily.   Yes [provider]  Melatonin ([employer] MELATONIN) 3 MG TABS Take 3 mg by mouth at bedtime.   Yes [provider]  metFORMIN (GLUCOPHAGE-XR) 500 MG 24 hr tablet Take 500 mg by mouth daily with breakfast.  11/01/13  Yes [provider]  metoCLOPramide (REGLAN) 5 MG tablet Take 5 mg by mouth 3 (three) times daily.   Yes [provider]  pantoprazole (PROTONIX) 40 MG tablet Take 40 mg by mouth 2 (two) times daily.   Yes [provider]  apixaban (ELIQUIS) 5 MG TABS tablet Take 1 tablet (5 mg total) by mouth 2 (two) times daily. Patient not taking: Reported on 02/24/2018 01/04/15   Catarina Hartshorn, MD      VITAL SIGNS:  Blood pressure (!) 102/56, pulse 81, temperature 99.9 F (37.7 C), temperature source Rectal, resp. rate (!) 25, height 4\' 9"  (1.448 m), weight 66.6 kg, SpO2 100 %.  PHYSICAL EXAMINATION:  Physical Exam  GENERAL:  84 y.o.-year-old patient lying in the bed with no acute distress.  EYES: Pupils equal, round, reactive to light and accommodation. No scleral icterus. Extraocular muscles intact.  HEENT: Head atraumatic, normocephalic.   NECK:  Supple, no jugular venous distention. No thyroid enlargement, no tenderness.  LUNGS: Normal breath sounds bilaterally, no wheezing, rales,rhonchi or crepitation. No use of accessory muscles of respiration.  CARDIOVASCULAR: S1, S2 normal. No murmurs, rubs, or gallops.  ABDOMEN: Soft, tenderness and distention.  No bowel sounds present. organomegaly or mass.  EXTREMITIES: No pedal edema, cyanosis, or clubbing.  NEUROLOGIC: Unable to exam. PSYCHIATRIC: The patient is confused, noncommunicative. SKIN: No obvious rash, lesion, or ulcer.   LABORATORY PANEL:   CBC Recent Labs  Lab 02/23/2018 1749  WBC 38.4*  HGB 9.8*  HCT 29.4*  PLT  332   ------------------------------------------------------------------------------------------------------------------  Chemistries  Recent Labs  Lab 02/22/2018 1749  NA 136  K 3.6  CL 94*  CO2 29  GLUCOSE 238*  BUN 40*  CREATININE 1.25*  CALCIUM 8.9  AST 24  ALT 14  ALKPHOS 110  BILITOT 0.8   ------------------------------------------------------------------------------------------------------------------  Cardiac Enzymes Recent Labs  Lab 02/13/2018 1749  TROPONINI 0.05*   ------------------------------------------------------------------------------------------------------------------  RADIOLOGY:  Ct Head Wo Contrast  Result Date: 02/12/2018 CLINICAL DATA:  Subdural hematoma EXAM: CT HEAD WITHOUT CONTRAST TECHNIQUE: Contiguous axial images were obtained from the base of the skull through the vertex without intravenous contrast. COMPARISON:  02/23/2018 FINDINGS: Brain: Evolutionary changes within the left subdural hematoma which is decreased in density. Thickness is approximately 14-15 mm, possibly slightly increased since prior study. Approximately 9 mm of left-to-right midline shift, stable. No hydrocephalus. Chronic microvascular disease within the deep white matter. Vascular: No hyperdense vessel or unexpected calcification. Skull: No  acute calvarial abnormality. Sinuses/Orbits: Opacified right sphenoid sinus, stable. Remainder the paranasal sinuses and mastoid air cells are clear. Orbital soft tissues unremarkable. Other: None IMPRESSION: Evolutionary changes within the left subdural hematoma, now mixed but predominantly low-density. Overall thickness of the subdural hematoma may be slightly increased since prior study, measuring 15 mm currently. Midline shift is stable at 9 mm. Chronic small vessel disease throughout the deep white matter. Electronically Signed   By: Charlett Nose M.D.   On: 02/14/2018 19:17   Ct Abdomen Pelvis W Contrast  Result Date: 02/14/2018 CLINICAL  DATA:  Status post subdural hemorrhage with gastroparesis 2 weeks ago. EXAM: CT ABDOMEN AND PELVIS WITH CONTRAST TECHNIQUE: Multidetector CT imaging of the abdomen and pelvis was performed using the standard protocol following bolus administration of intravenous contrast. CONTRAST:  75mL OMNIPAQUE IOHEXOL 300 MG/ML  SOLN COMPARISON:  CT scan August 05, 2009 FINDINGS: Lower chest: Atelectasis in the lung bases. Cardiomegaly. Rounded decreased attenuation in the left atrial appendage on series 3, image 3 measuring up to 16 mm. Hepatobiliary: Previous cholecystectomy. Mild intrahepatic ductal dilatation may be due to previous cholecystectomy or small-bowel obstruction which will be described below. Mild hepatic steatosis. No liver mass noted. Portal vein is patent. Pancreas: Unremarkable. No pancreatic ductal dilatation or surrounding inflammatory changes. Spleen: Normal in size without focal abnormality. Adrenals/Urinary Tract: Adrenal glands are normal. There is an exophytic cyst off the atrophic right kidney which is stable. No acute abnormality in the left kidney. No ureteral stones. The bladder is decompressed with a Foley catheter. No other abnormalities. Stomach/Bowel: The stomach is markedly distended. The small bowel is also distended along most of its length. There is an abrupt turn in the third portion of the duodenum when it crosses back to the right and into the right side of the abdomen. No swirling vessels or mesentery identified. No bowel wall thickening in this region and no adjacent fat stranding. The second transition point appears to be in the right lower quadrant within distal ileum. Colonic diverticulosis is seen without diverticulitis. Visualized portions of the appendix are normal. Vascular/Lymphatic: Atherosclerotic changes are seen in the abdominal aorta. The proximal abdominal aorta measures 3.8 cm, mildly aneurysmal. Low-attenuation at the origin of the SMA best seen on coronal image 74 in  73 is consistent with thrombus. Atherosclerotic changes extend into the iliac and femoral vessels. No adenopathy. Reproductive: Uterus and bilateral adnexa are unremarkable. Other: No free air or free fluid. Musculoskeletal: No acute or significant osseous findings. IMPRESSION: 1. Apparent thrombus at the origin of the SMA. 2. There is an abrupt turn in the third portion of the duodenum. At this turn, the duodenum courses back to the right side of the abdomen. There is no malrotation seen on the 2011 comparison. I suspect that this abrupt turn contributes to the marked gastric distention. There is no wall thickening, fat stranding, or swirling vessels to definitively suggest a volvulus. This abrupt turn, new since 2011, could be due to an internal hernia potentially. 3. Distal to the abrupt turn in the duodenum, the small bowel remains dilated into the right lower quadrant with a second transition point in the distal ileum. Small bowel obstruction. 4. Thrombus in the left atrial appendage. 5. Abdominal aortic aneurysm proximally measuring 3.8 cm. Findings called to Dr. Roxan Hockey. Electronically Signed   By: Gerome Sam III M.D   On: 02/22/2018 19:42   Dg Chest Portable 1 View  Result Date: 03/05/2018 CLINICAL DATA:  Difficulty breathing, shallow respirations,  low O2 sats. EXAM: PORTABLE CHEST 1 VIEW COMPARISON:  Chest x-ray dated 03/26/2017. FINDINGS: Study is hypoinspiratory. Given the low lung volumes, lungs appear clear. No pleural effusion or pneumothorax seen. Heart size and mediastinal contours appear stable. LEFT chest wall pacemaker/ICD lead appears grossly stable in position. Median sternotomy wires appear intact and stable in alignment. No acute appearing osseous abnormality. IMPRESSION: Low lung volumes. No active disease. No evidence of pneumonia or pulmonary edema. Electronically Signed   By: Bary RichardStan  Maynard M.D.   On: 06-16-2018 18:22      IMPRESSION AND PLAN:   Greer Pickerelrma Ruedas  is a 84 y.o.  female with a known history of arthritis, CAD, CHF, hypertension, diabetes and hyperlipidemia.  The patient was [facility].  [facility] due to increasing altered mental status and confusion.    Small bowel obstruction and  has evidence of SMA thrombus. Acute metabolic encephalopathy due to above. Recent subdural hematoma. Acute renal failure due to dehydration. Elevated troponin. Lactic acidosis.  The patient has very poor prognosis.  Family member decided NGT for decompression and comfort care.  All the records are reviewed and case discussed with ED provider. Management plans discussed with the patient, family and they are in agreement.  CODE STATUS: DNR.  TOTAL TIME TAKING CARE OF THIS PATIENT: 38 minutes.    Shaune PollackQing Frederica Chrestman M.D on 02-19-2018 at 8:50 PM  Between 7am to 6pm - Pager - [phone]  After 6pm go to [email or web address] - Social research officer, governmentpassword [email or web address]  [facility] [place] [facility]  Office  [phone]  CC: Primary care physician; Kandyce RudBabaoff, Marcus, MD   Note: This dictation was prepared with Dragon dictation along with smaller phrase technology. Any transcriptional errors that result from this process are unin

## 2018-03-06 NOTE — ED Notes (Signed)
Pt flacid Right upper and lower extremity, d/t previous stroke. Per family, pt is baseline at this time.

## 2018-03-07 MED ORDER — LORAZEPAM 2 MG/ML IJ SOLN
0.5000 mg | INTRAMUSCULAR | Status: DC | PRN
  Administered 2018-03-07: 0.5 mg via INTRAVENOUS
  Filled 2018-03-07: qty 1

## 2018-03-07 MED ORDER — MORPHINE SULFATE (PF) 2 MG/ML IV SOLN
2.0000 mg | INTRAVENOUS | Status: DC | PRN
  Filled 2018-03-07: qty 1

## 2018-03-07 MED ORDER — HYDROMORPHONE HCL 1 MG/ML IJ SOLN
0.5000 mg | INTRAMUSCULAR | Status: DC | PRN
  Administered 2018-03-07 (×3): 0.5 mg via INTRAVENOUS
  Filled 2018-03-07 (×3): qty 1

## 2018-03-08 LAB — BLOOD GAS, VENOUS
ACID-BASE EXCESS: 4.6 mmol/L — AB (ref 0.0–2.0)
BICARBONATE: 29.2 mmol/L — AB (ref 20.0–28.0)
O2 SAT: 74.6 %
PATIENT TEMPERATURE: 37
pCO2, Ven: 43 mmHg — ABNORMAL LOW (ref 44.0–60.0)
pH, Ven: 7.44 — ABNORMAL HIGH (ref 7.250–7.430)
pO2, Ven: 38 mmHg (ref 32.0–45.0)

## 2018-03-08 LAB — URINE CULTURE: Culture: >=100000 — AB

## 2018-03-08 NOTE — Progress Notes (Addendum)
Around 2315 family came out and said that patient was not breathing. Irving Copashristina Welch and I went into the room and pt was barely breathing. Pt comfort care, DNR/NMP . Couple a minutes later pt family member came out to the nurses station said to come to room. Arrived in room 118 with second RN Trula OreChristina. Both of us verified that pt was deceased (no respiratory effort, no apical or carotid pulse) Pronounced at 2318. Offered to call chaplain and family declined services. Notified Dr Anne HahnWillis at 2319 of patient death. Nursing administrator  Shari HeritageAlesha Rollins notified well . Family consoled. [facility] notified. Spoke with [employer]. Pt deemed not eligible for donation. Post mortem care given. IV lines and indwelling foley cath removed per policy. [employer] picked patient up at [phone]. All personal belongings given to family.

## 2018-03-11 LAB — CULTURE, BLOOD (ROUTINE X 2)
CULTURE: NO GROWTH
SPECIAL REQUESTS: ADEQUATE

## 2018-03-12 LAB — CULTURE, BLOOD (ROUTINE X 2): Culture: NO GROWTH

## 2018-03-13 NOTE — Progress Notes (Signed)
Advance care planning  Patient drowsy and confused.  Unable to make healthcare decisions.  Met with daughter and other family members in the room.  She is DO NOT RESUSCITATE and DO NOT INTUBATE.  Admitted for small bowel obstruction and recent subdural hematoma.  Has declined functionally. Family understands grave prognosis.  NG tube in place and they feel this has helped with decreased bloating.  She continues to moan at times and pain.  The request that patient be kept comfortable.  We discussed regarding starting morphine.  Initially I had ordered morphine after discussing with family that confusion and drowsiness with morphine are side effects and not allergy.  Later they requested that I change this to something else.  Discussed that all narcotics would be having similar side effects and they have agreed to patient having Dilaudid as needed for pain control.  At this time we will continue pain and anxiety medications as needed.  Continue NG tube for comfort.  Palliative care consulted.  Likely discharge to hospice home.  Time spent 20 minutes

## 2018-03-13 NOTE — Progress Notes (Signed)
[employer] - [place] [facility]   PATIENT NAME: Veronica Short    MR#:  [id number]  DATE OF BIRTH:  August 16, 1923  SUBJECTIVE:  CHIEF COMPLAINT:  No chief complaint on file.  Patient laying in bed and drowsy.  Family at bedside.  NG tube with intermittent suction. Patient grimacing in pain at times with hand over her belly.  REVIEW OF SYSTEMS:    Review of Systems  Unable to perform ROS: Mental status change    DRUG ALLERGIES:   Allergies  Allergen Reactions  . Morphine Anxiety    NUMBNESS  . Penicillins Hives and Rash    Has patient had a PCN reaction causing immediate rash, facial/tongue/throat swelling, SOB or lightheadedness with hypotension: No Has patient had a PCN reaction causing severe rash involving mucus membranes or skin necrosis: No Has patient had a PCN reaction that required hospitalization: No Has patient had a PCN reaction occurring within the last 10 years: No If all of the above answers are "NO", then may proceed with Cephalosporin use.     VITALS:  Blood pressure (!) 108/56, pulse 68, temperature 98 F (36.7 C), temperature source Oral, resp. rate (!) 25, height 4\' 9"  (1.448 m), weight 61.9 kg, SpO2 100 %.  PHYSICAL EXAMINATION:   Physical Exam  GENERAL:  84 y.o.-year-old patient lying in the bed.  Looks critically ill EYES: Pupils equal, round, reactive to light and accommodation. No scleral icterus. Extraocular muscles intact.  HEENT: Head atraumatic, normocephalic. Oropharynx and nasopharynx clear.  NECK:  Supple, no jugular venous distention. No thyroid enlargement, no tenderness.  LUNGS: Increased work of breathing CARDIOVASCULAR: S1, S2 , tachycardia ABDOMEN: Soft, nontender, nondistended. Bowel sounds absent. No organomegaly or mass.  EXTREMITIES: No cyanosis NEUROLOGIC: Not following instructions PSYCHIATRIC: The patient is drowsy  LABORATORY PANEL:   CBC Recent Labs  Lab 02/11/2018 1749  WBC 38.4*  HGB 9.8*  HCT  29.4*  PLT 332   ------------------------------------------------------------------------------------------------------------------ Chemistries  Recent Labs  Lab 03/10/2018 1749  NA 136  K 3.6  CL 94*  CO2 29  GLUCOSE 238*  BUN 40*  CREATININE 1.25*  CALCIUM 8.9  AST 24  ALT 14  ALKPHOS 110  BILITOT 0.8   ------------------------------------------------------------------------------------------------------------------  Cardiac Enzymes Recent Labs  Lab 02/15/2018 1749  TROPONINI 0.05*   ------------------------------------------------------------------------------------------------------------------  RADIOLOGY:  Ct Head Wo Contrast  Result Date: 03/12/2018 CLINICAL DATA:  Subdural hematoma EXAM: CT HEAD WITHOUT CONTRAST TECHNIQUE: Contiguous axial images were obtained from the base of the skull through the vertex without intravenous contrast. COMPARISON:  02/23/2018 FINDINGS: Brain: Evolutionary changes within the left subdural hematoma which is decreased in density. Thickness is approximately 14-15 mm, possibly slightly increased since prior study. Approximately 9 mm of left-to-right midline shift, stable. No hydrocephalus. Chronic microvascular disease within the deep white matter. Vascular: No hyperdense vessel or unexpected calcification. Skull: No acute calvarial abnormality. Sinuses/Orbits: Opacified right sphenoid sinus, stable. Remainder the paranasal sinuses and mastoid air cells are clear. Orbital soft tissues unremarkable. Other: None IMPRESSION: Evolutionary changes within the left subdural hematoma, now mixed but predominantly low-density. Overall thickness of the subdural hematoma may be slightly increased since prior study, measuring 15 mm currently. Midline shift is stable at 9 mm. Chronic small vessel disease throughout the deep white matter. Electronically Signed   By: Charlett Nose M.D.   On: 03/04/2018 19:17   Ct Abdomen Pelvis W Contrast  Result Date:  02/24/2018 CLINICAL DATA:  Status post subdural hemorrhage with gastroparesis 2  weeks ago. EXAM: CT ABDOMEN AND PELVIS WITH CONTRAST TECHNIQUE: Multidetector CT imaging of the abdomen and pelvis was performed using the standard protocol following bolus administration of intravenous contrast. CONTRAST:  75mL OMNIPAQUE IOHEXOL 300 MG/ML  SOLN COMPARISON:  CT scan August 05, 2009 FINDINGS: Lower chest: Atelectasis in the lung bases. Cardiomegaly. Rounded decreased attenuation in the left atrial appendage on series 3, image 3 measuring up to 16 mm. Hepatobiliary: Previous cholecystectomy. Mild intrahepatic ductal dilatation may be due to previous cholecystectomy or small-bowel obstruction which will be described below. Mild hepatic steatosis. No liver mass noted. Portal vein is patent. Pancreas: Unremarkable. No pancreatic ductal dilatation or surrounding inflammatory changes. Spleen: Normal in size without focal abnormality. Adrenals/Urinary Tract: Adrenal glands are normal. There is an exophytic cyst off the atrophic right kidney which is stable. No acute abnormality in the left kidney. No ureteral stones. The bladder is decompressed with a Foley catheter. No other abnormalities. Stomach/Bowel: The stomach is markedly distended. The small bowel is also distended along most of its length. There is an abrupt turn in the third portion of the duodenum when it crosses back to the right and into the right side of the abdomen. No swirling vessels or mesentery identified. No bowel wall thickening in this region and no adjacent fat stranding. The second transition point appears to be in the right lower quadrant within distal ileum. Colonic diverticulosis is seen without diverticulitis. Visualized portions of the appendix are normal. Vascular/Lymphatic: Atherosclerotic changes are seen in the abdominal aorta. The proximal abdominal aorta measures 3.8 cm, mildly aneurysmal. Low-attenuation at the origin of the SMA best seen on  coronal image 74 in 73 is consistent with thrombus. Atherosclerotic changes extend into the iliac and femoral vessels. No adenopathy. Reproductive: Uterus and bilateral adnexa are unremarkable. Other: No free air or free fluid. Musculoskeletal: No acute or significant osseous findings. IMPRESSION: 1. Apparent thrombus at the origin of the SMA. 2. There is an abrupt turn in the third portion of the duodenum. At this turn, the duodenum courses back to the right side of the abdomen. There is no malrotation seen on the 2011 comparison. I suspect that this abrupt turn contributes to the marked gastric distention. There is no wall thickening, fat stranding, or swirling vessels to definitively suggest a volvulus. This abrupt turn, new since 2011, could be due to an internal hernia potentially. 3. Distal to the abrupt turn in the duodenum, the small bowel remains dilated into the right lower quadrant with a second transition point in the distal ileum. Small bowel obstruction. 4. Thrombus in the left atrial appendage. 5. Abdominal aortic aneurysm proximally measuring 3.8 cm. Findings called to Dr. Roxan Hockey. Electronically Signed   By: Gerome Sam III M.D   On: 02/15/2018 19:42   Dg Chest Portable 1 View  Result Date: 03/05/2018 CLINICAL DATA:  Difficulty breathing, shallow respirations, low O2 sats. EXAM: PORTABLE CHEST 1 VIEW COMPARISON:  Chest x-ray dated 03/26/2017. FINDINGS: Study is hypoinspiratory. Given the low lung volumes, lungs appear clear. No pleural effusion or pneumothorax seen. Heart size and mediastinal contours appear stable. LEFT chest wall pacemaker/ICD lead appears grossly stable in position. Median sternotomy wires appear intact and stable in alignment. No acute appearing osseous abnormality. IMPRESSION: Low lung volumes. No active disease. No evidence of pneumonia or pulmonary edema. Electronically Signed   By: Bary Richard M.D.   On: 02/23/2018 18:22     ASSESSMENT AND PLAN:   Aryia Delira  is a 84  y.o. female with a known history of arthritis, CAD, CHF, hypertension, diabetes and hyperlipidemia.  [facility].  [facility] was [facility] due to increasing altered mental status and confusion.    * Small bowel obstruction * SMA thrombus. * Acute metabolic encephalopathy * Recent subdural hematoma. * Acute kidney injury due to dehydration. * Elevated troponin. * Lactic acidosis.  Patient continues to be drowsy and with abdominal pain.  NG tube placed for comfort.  Will add IV Dilaudid as needed.  Discussed with family regarding poor prognosis.  All the records are reviewed and case discussed with Care Management/Social Worker Management plans discussed with the patient, family and they are in agreement.  CODE STATUS: DNR  DVT Prophylaxis: SCDs  TOTAL TIME TAKING CARE OF THIS PATIENT: 25 minutes.   Molinda BailiffSrikar R Yusuf Yu M.D on 02/13/2018 at 1:35 PM  Between 7am to 6pm - Pager - [phone]  After 6pm go to [email or web address] - password [email or web address]  [facility]  Office  [phone]  CC: Primary care physician; Kandyce RudBabaoff, Marcus, MD  Note: This dictation was prepared with Dragon dictation along with smaller phrase technology. Any transcriptional errors that result from this process are unintentional.

## 2018-03-13 DEATH — deceased

## 2018-03-24 ENCOUNTER — Ambulatory Visit: Payer: Medicare HMO | Admitting: Podiatry

## 2018-04-12 NOTE — Death Summary Note (Signed)
DEATH SUMMARY   Patient Details  Name: Veronica Short MRN: [id number] DOB: February 22, 1924  Admission/Discharge Information   Admit Date:  03-23-2018  Date of Death: Date of Death: 2018-03-24  Time of Death: Time of Death: 2316/12/04  Length of Stay: 2  Referring Physician: Kandyce Rud, MD   Reason(s) for Hospitalization   HISTORY OF PRESENT ILLNESS:  Veronica Short  is a 84 y.o. female with a known history of arthritis, CAD, CHF, hypertension, diabetes and hyperlipidemia.  The patient was recently admitted at [facility] for subdural hematoma.  She was sent [facility] from [facility] due to increasing altered mental status and confusion.  She has had  decreased oral intake, nausea and vomiting.  She was found hypoxia at low 80s, hypotensive and leukocytosis.  Family reported that the patient has significant decline in her status in the past few days. CT imaging does show evidence of small bowel obstruction and has evidence of SMA thrombus.  However the patient was recently diagnosed with a worsening subdural hematoma.  She has very poor prognosis.  Dr. Roxan Hockey had extensive conversation with the patient's family at bedside.  The patient family decided NGT and comfort care.     Diagnoses  Preliminary cause of death:  Secondary Diagnoses (including complications and co-morbidities):  Active Problems:   SBO (small bowel obstruction) [facility])   Brief Hospital Course (including significant findings, care, treatment, and services provided and events leading to death)   Patient was admitted to medical floor.  NG tube was placed for comfort.  Due to worsening subdural hematoma and mental status with no improvement palliative care was consulted.  Patient was transitioned to comfort measures.  Pain medications and anxiety medications added.  Patient was pronounced dead on 03/24/18 at 12/04/16    Pertinent Labs and Studies  Significant Diagnostic Studies Ct Head Wo Contrast  Result Date:  2018/03/23 CLINICAL DATA:  Subdural hematoma EXAM: CT HEAD WITHOUT CONTRAST TECHNIQUE: Contiguous axial images were obtained from the base of the skull through the vertex without intravenous contrast. COMPARISON:  02/23/2018 FINDINGS: Brain: Evolutionary changes within the left subdural hematoma which is decreased in density. Thickness is approximately 14-15 mm, possibly slightly increased since prior study. Approximately 9 mm of left-to-right midline shift, stable. No hydrocephalus. Chronic microvascular disease within the deep white matter. Vascular: No hyperdense vessel or unexpected calcification. Skull: No acute calvarial abnormality. Sinuses/Orbits: Opacified right sphenoid sinus, stable. Remainder the paranasal sinuses and mastoid air cells are clear. Orbital soft tissues unremarkable. Other: None IMPRESSION: Evolutionary changes within the left subdural hematoma, now mixed but predominantly low-density. Overall thickness of the subdural hematoma may be slightly increased since prior study, measuring 15 mm currently. Midline shift is stable at 9 mm. Chronic small vessel disease throughout the deep white matter. Electronically Signed   By: Charlett Nose M.D.   On: March 23, 2018 19:17   Ct Head Wo Contrast  Result Date: 02/23/2018 CLINICAL DATA:  Subdural hematoma on 02/13/18. Headache today with hypertension. EXAM: CT HEAD WITHOUT CONTRAST TECHNIQUE: Contiguous axial images were obtained from the base of the skull through the vertex without intravenous contrast. COMPARISON:  02/13/2018 FINDINGS: Brain: Left frontoparietal subdural hematoma measuring about 11 mm maximal thickness. There are areas of increased density within the hemorrhage, increasing since previous study. This likely indicates acute rebleed into the subacute hemorrhage. Small amount of bleed over the left tentorium as well. Since the prior study, there is increased left-to-right midline shift now measuring about 9 mm. Mass effect  is also  indicated with sulcal effacement and effacement of the left lateral ventricle. There is underlying atrophy and small vessel ischemic changes in the deep white matter. Basal cisterns are not effaced. Gray-white matter junctions remain distinct. Vascular: Intracranial arterial vascular calcifications are present. Skull: Calvarium appears intact. Sinuses/Orbits: Opacification of the left sphenoid sinus similar to previous study. Improved appearance of the ethmoid air cells since previous study. Mastoid air cells remain clear. Other: None. IMPRESSION: Left frontoparietal subdural hematoma measuring about 11 mm maximal thickness. Increasing density within the hemorrhage consistent with acute rebleed into the pre-existing subacute hemorrhage. Increasing left-to-right midline shift now measuring 9 mm. These results were called by telephone at the time of interpretation on 02/23/2018 at 2:13 am to Dr. Dionne Bucy , who verbally acknowledged these results. Electronically Signed   By: Burman Nieves M.D.   On: 02/23/2018 02:17   Ct Abdomen Pelvis W Contrast  Result Date: 02/25/2018 CLINICAL DATA:  Status post subdural hemorrhage with gastroparesis 2 weeks ago. EXAM: CT ABDOMEN AND PELVIS WITH CONTRAST TECHNIQUE: Multidetector CT imaging of the abdomen and pelvis was performed using the standard protocol following bolus administration of intravenous contrast. CONTRAST:  75mL OMNIPAQUE IOHEXOL 300 MG/ML  SOLN COMPARISON:  CT scan August 05, 2009 FINDINGS: Lower chest: Atelectasis in the lung bases. Cardiomegaly. Rounded decreased attenuation in the left atrial appendage on series 3, image 3 measuring up to 16 mm. Hepatobiliary: Previous cholecystectomy. Mild intrahepatic ductal dilatation may be due to previous cholecystectomy or small-bowel obstruction which will be described below. Mild hepatic steatosis. No liver mass noted. Portal vein is patent. Pancreas: Unremarkable. No pancreatic ductal dilatation or  surrounding inflammatory changes. Spleen: Normal in size without focal abnormality. Adrenals/Urinary Tract: Adrenal glands are normal. There is an exophytic cyst off the atrophic right kidney which is stable. No acute abnormality in the left kidney. No ureteral stones. The bladder is decompressed with a Foley catheter. No other abnormalities. Stomach/Bowel: The stomach is markedly distended. The small bowel is also distended along most of its length. There is an abrupt turn in the third portion of the duodenum when it crosses back to the right and into the right side of the abdomen. No swirling vessels or mesentery identified. No bowel wall thickening in this region and no adjacent fat stranding. The second transition point appears to be in the right lower quadrant within distal ileum. Colonic diverticulosis is seen without diverticulitis. Visualized portions of the appendix are normal. Vascular/Lymphatic: Atherosclerotic changes are seen in the abdominal aorta. The proximal abdominal aorta measures 3.8 cm, mildly aneurysmal. Low-attenuation at the origin of the SMA best seen on coronal image 74 in 73 is consistent with thrombus. Atherosclerotic changes extend into the iliac and femoral vessels. No adenopathy. Reproductive: Uterus and bilateral adnexa are unremarkable. Other: No free air or free fluid. Musculoskeletal: No acute or significant osseous findings. IMPRESSION: 1. Apparent thrombus at the origin of the SMA. 2. There is an abrupt turn in the third portion of the duodenum. At this turn, the duodenum courses back to the right side of the abdomen. There is no malrotation seen on the 2011 comparison. I suspect that this abrupt turn contributes to the marked gastric distention. There is no wall thickening, fat stranding, or swirling vessels to definitively suggest a volvulus. This abrupt turn, new since 2011, could be due to an internal hernia potentially. 3. Distal to the abrupt turn in the duodenum, the small  bowel remains dilated into the right lower quadrant  with a second transition point in the distal ileum. Small bowel obstruction. 4. Thrombus in the left atrial appendage. 5. Abdominal aortic aneurysm proximally measuring 3.8 cm. Findings called to Dr. Roxan Hockey. Electronically Signed   By: Gerome Sam III M.D   On: 03/01/2018 19:42   Dg Chest Portable 1 View  Result Date: 03/03/2018 CLINICAL DATA:  Difficulty breathing, shallow respirations, low O2 sats. EXAM: PORTABLE CHEST 1 VIEW COMPARISON:  Chest x-ray dated 03/26/2017. FINDINGS: Study is hypoinspiratory. Given the low lung volumes, lungs appear clear. No pleural effusion or pneumothorax seen. Heart size and mediastinal contours appear stable. LEFT chest wall pacemaker/ICD lead appears grossly stable in position. Median sternotomy wires appear intact and stable in alignment. No acute appearing osseous abnormality. IMPRESSION: Low lung volumes. No active disease. No evidence of pneumonia or pulmonary edema. Electronically Signed   By: Bary Richard M.D.   On: 03/09/2018 18:22    Microbiology No results found for this or any previous visit (from the past 240 hour(s)).  Lab Basic Metabolic Panel: No results for input(s): NA, K, CL, CO2, GLUCOSE, BUN, CREATININE, CALCIUM, MG, PHOS in the last 168 hours. Liver Function Tests: No results for input(s): AST, ALT, ALKPHOS, BILITOT, PROT, ALBUMIN in the last 168 hours. No results for input(s): LIPASE, AMYLASE in the last 168 hours. No results for input(s): AMMONIA in the last 168 hours. CBC: No results for input(s): WBC, NEUTROABS, HGB, HCT, MCV, PLT in the last 168 hours. Cardiac Enzymes: No results for input(s): CKTOTAL, CKMB, CKMBINDEX, TROPONINI in the last 168 hours. Sepsis Labs: No results for input(s): PROCALCITON, WBC, LATICACIDVEN in the last 168 hours.  Procedures/Operations  NGT   Smt. Loder R Adisynn Suleiman 03/20/2018, 11:56 AM

## 2018-09-30 IMAGING — CT CT HEAD W/O CM
3 series · 15 of 47 positions shown, 18 images · non-contrast
Comparison: 02/23/2018

CLINICAL DATA: Subdural hematoma

EXAM:
CT HEAD WITHOUT CONTRAST
TECHNIQUE: Contiguous axial images were obtained from the base of the skull
through the vertex without intravenous contrast.

[Series 2: head wo · axial · 0.47mm/px · z∈[+338,+463]mm · 9 of 30 slices shown, 12 images]
[im 3/30  brain]
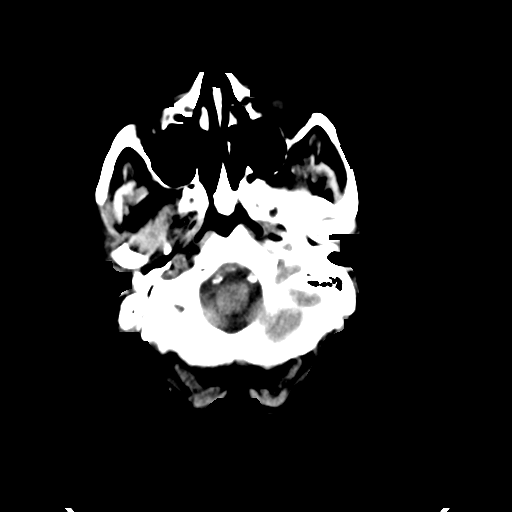
[im 3/30  bone]
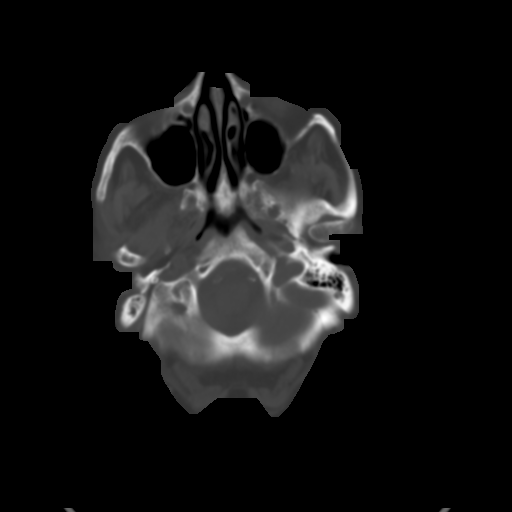
[im 6/30  brain]
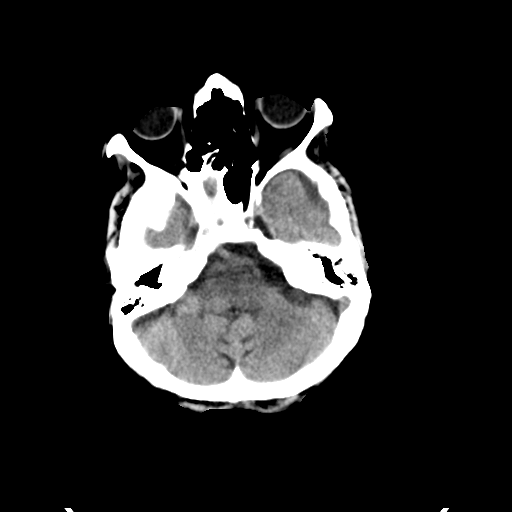
[im 9/30  brain]
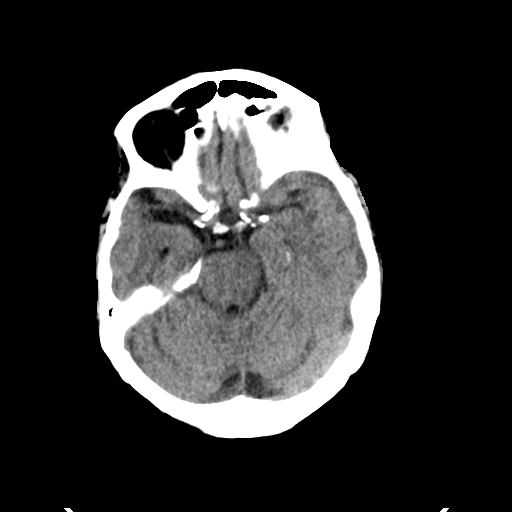
[im 12/30  brain]
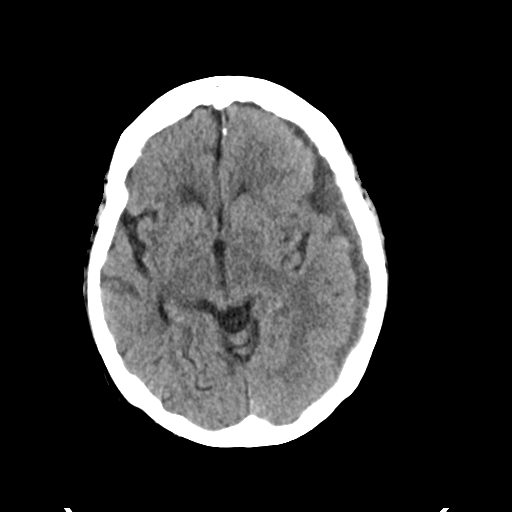
[im 16/30  brain]
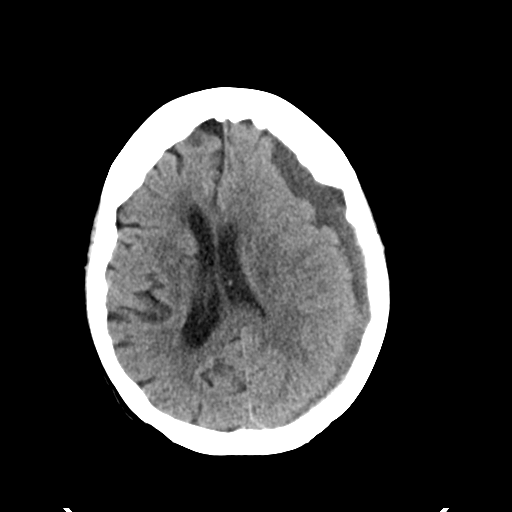
[im 16/30  bone]
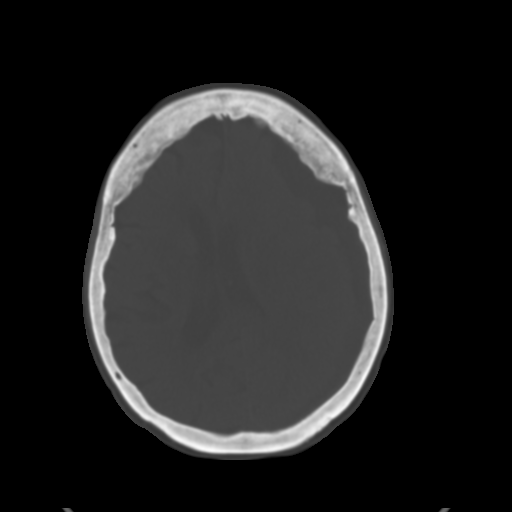
[im 19/30  brain]
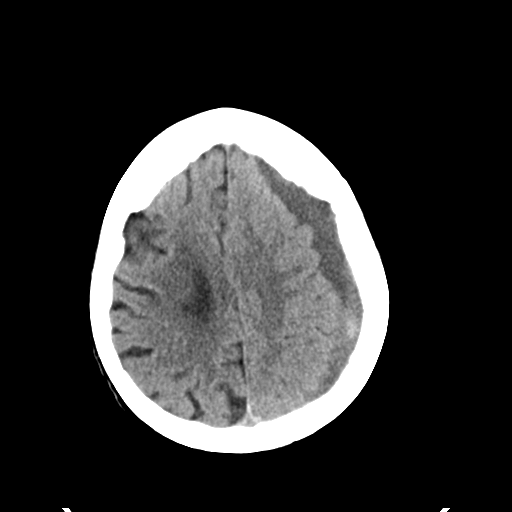
[im 22/30  brain]
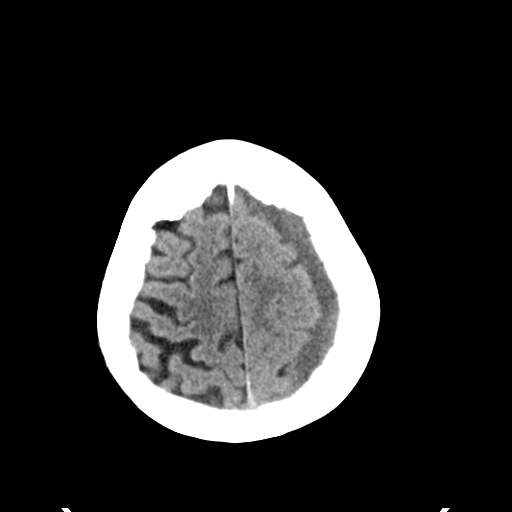
[im 25/30  brain]
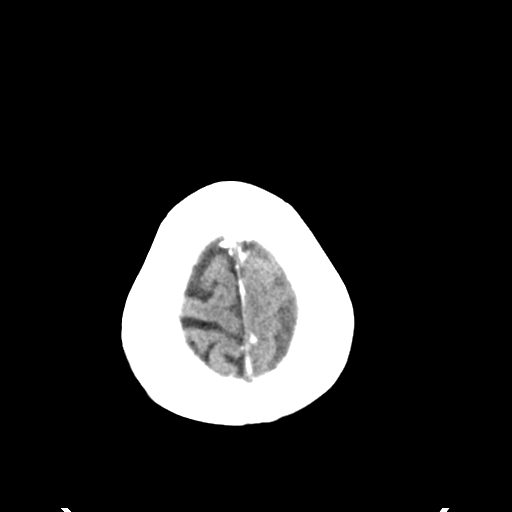
[im 28/30  brain]
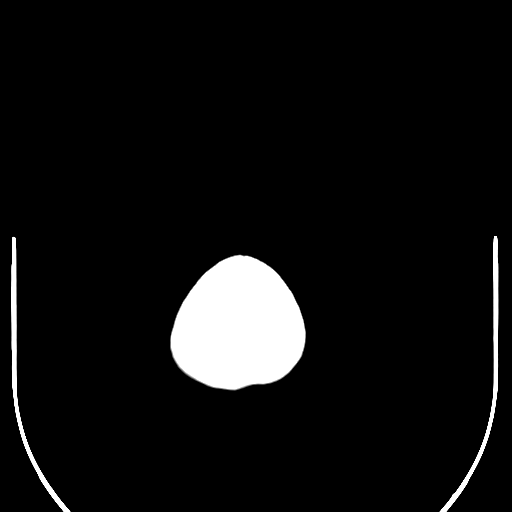
[im 28/30  bone]
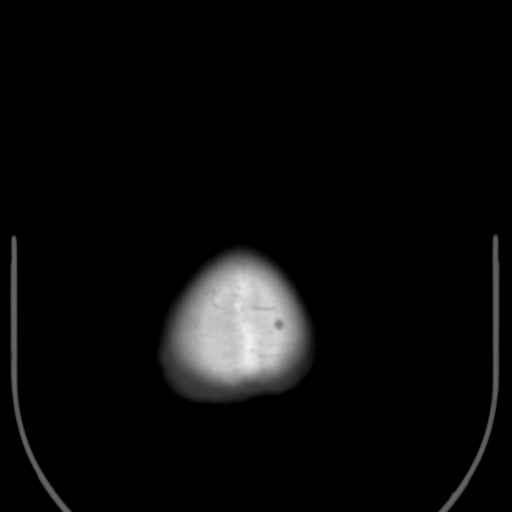

[Series 4: coronal soft tissue · coronal · 0.28mm/px · 3 of 63 slices shown]
[im 21/63  brain]
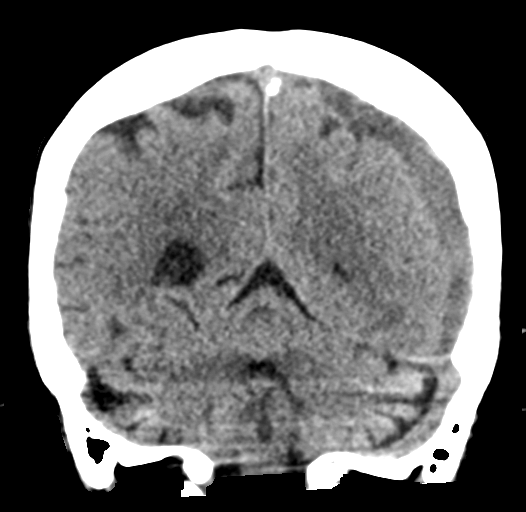
[im 28/63  brain]
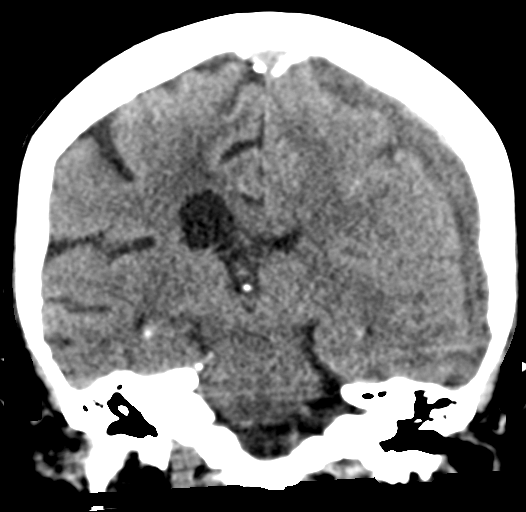
[im 35/63  brain]
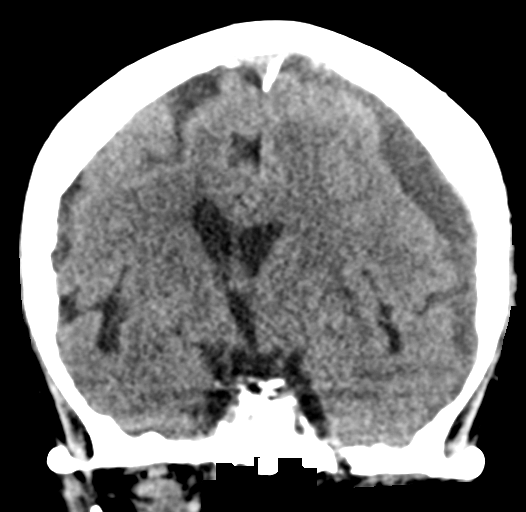

[Series 5: sagittal soft tissue · sagittal · 0.26mm/px · 3 of 51 slices shown]
[im 17/51  brain]
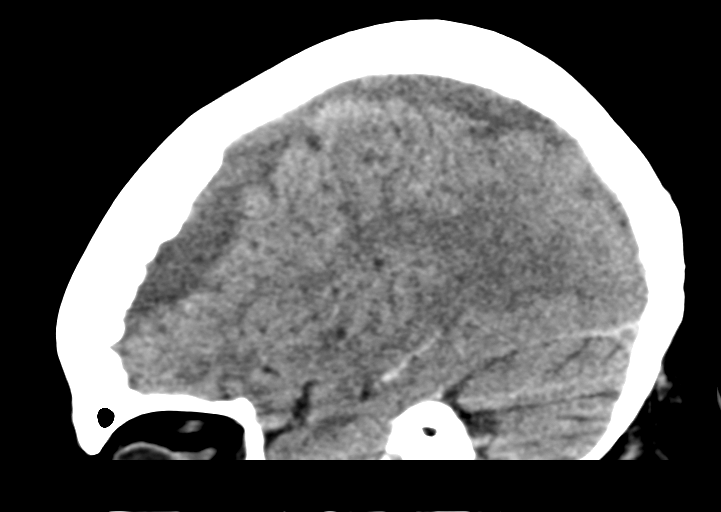
[im 26/51  brain]
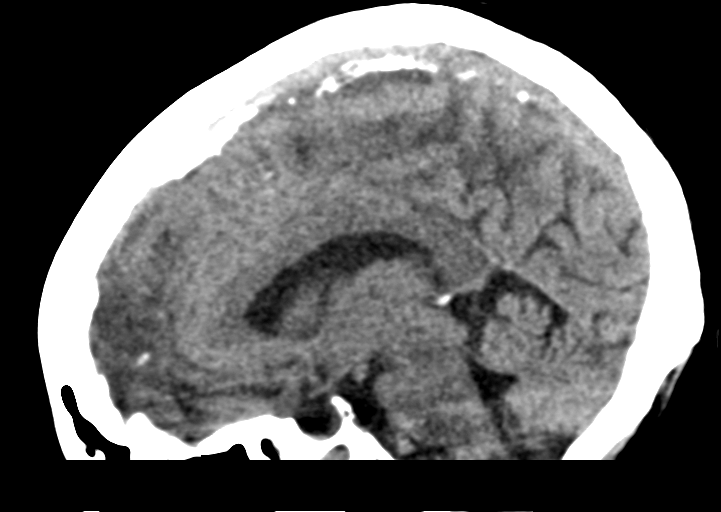
[im 34/51  brain]
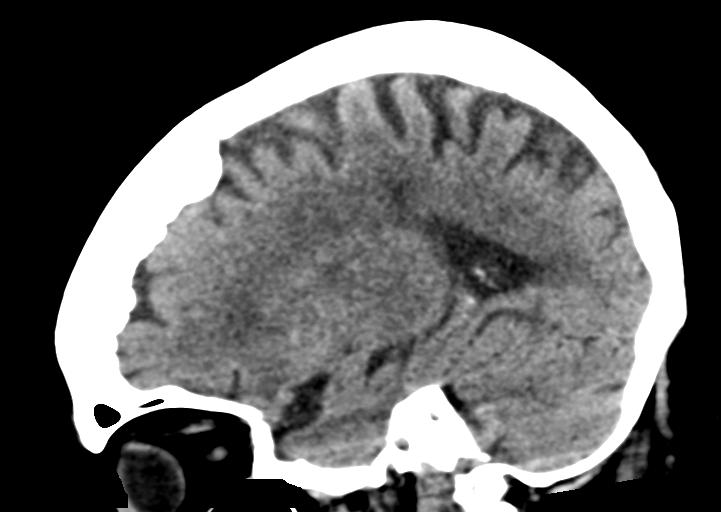

[15 of 47 positions shown; findings below may reference images not displayed]

FINDINGS: Brain: Evolutionary changes within the left subdural hematoma which
is decreased in density. Thickness is approximately 14-15 mm,
possibly slightly increased since prior study. Approximately 9 mm of
left-to-right midline shift, stable. No hydrocephalus. Chronic
microvascular disease within the deep white matter.

Vascular: No hyperdense vessel or unexpected calcification.

Skull: No acute calvarial abnormality.

Sinuses/Orbits: Opacified right sphenoid sinus, stable. Remainder
the paranasal sinuses and mastoid air cells are clear. Orbital soft
tissues unremarkable.

Other: None
IMPRESSION: Evolutionary changes within the left subdural hematoma, now mixed
but predominantly low-density. Overall thickness of the subdural
hematoma may be slightly increased since prior study, measuring 15
mm currently. Midline shift is stable at 9 mm.

Chronic small vessel disease throughout the deep white matter.

## 2018-09-30 IMAGING — DX DG CHEST 1V PORT
1 series · 1 of 1 positions shown · non-contrast
Comparison: Chest x-ray dated 03/26/2017.

CLINICAL DATA: Difficulty breathing, shallow respirations, low O2
sats.

EXAM:
PORTABLE CHEST 1 VIEW

[chest ap]
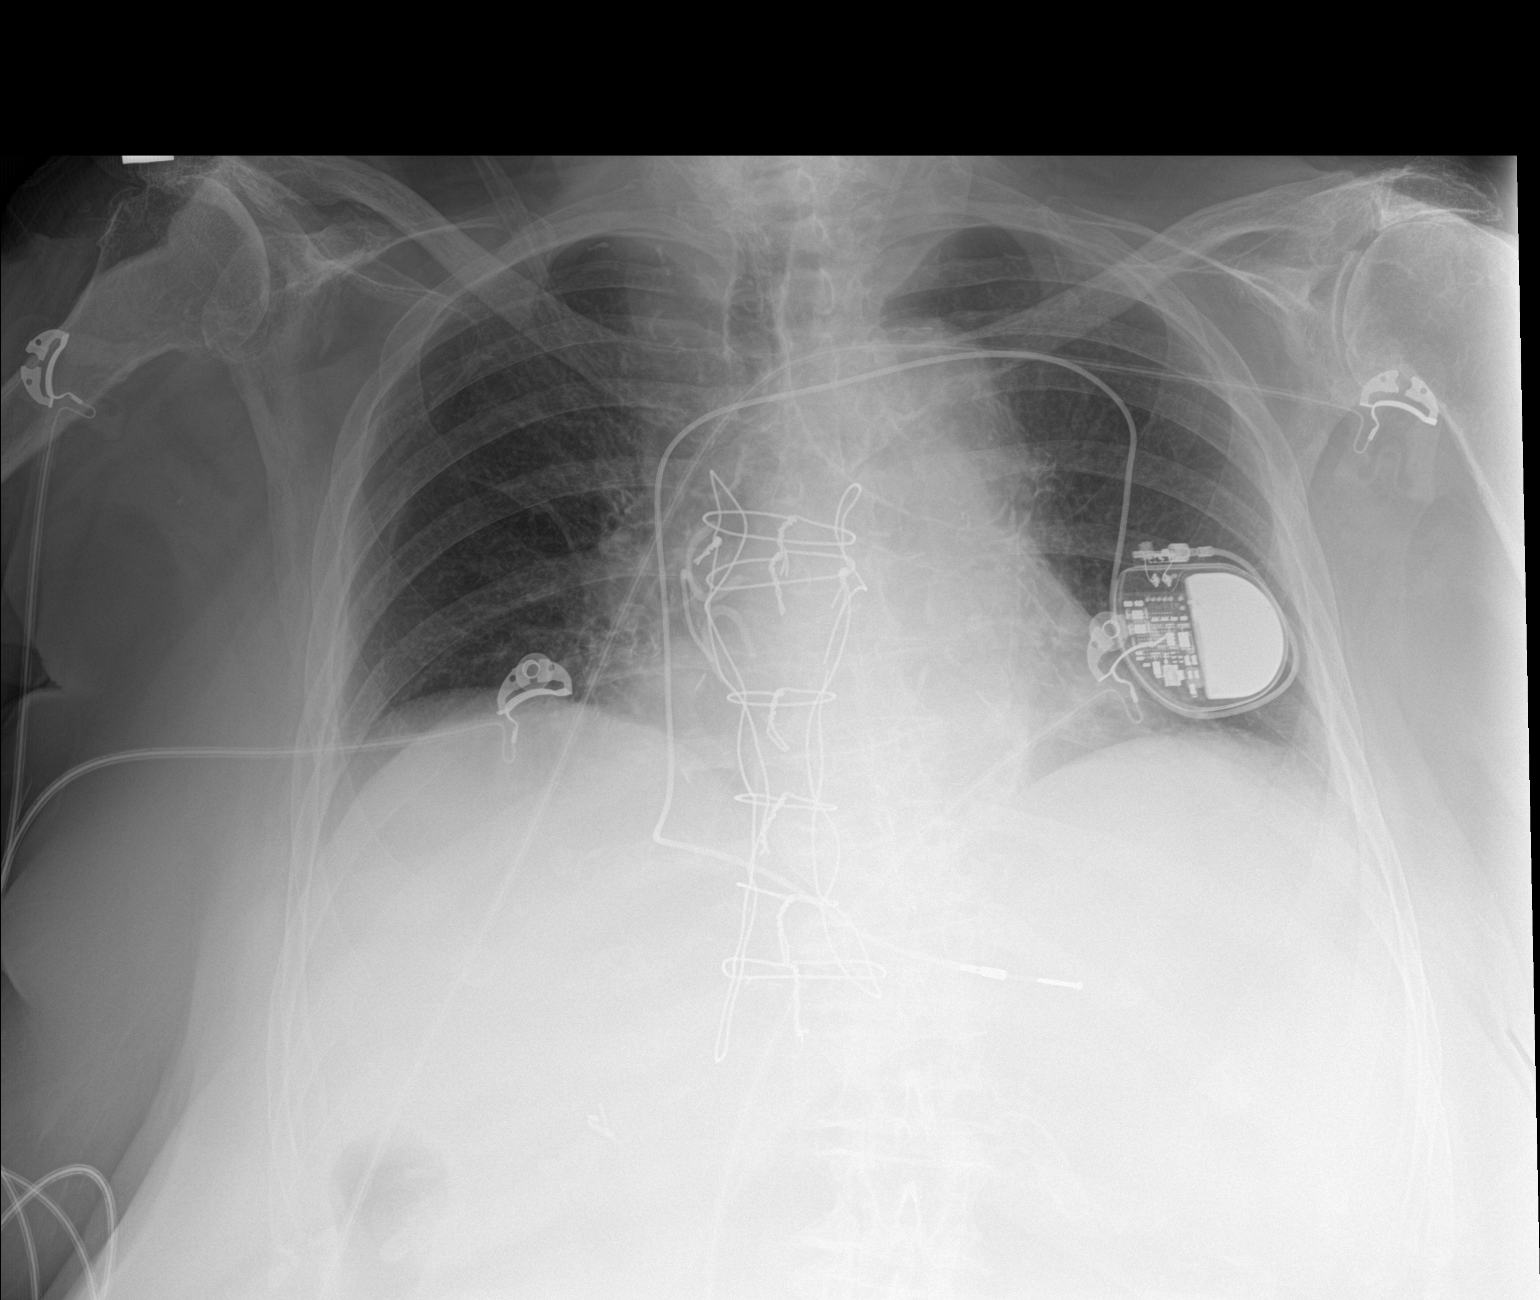

[1 of 1 positions shown; findings below may reference images not displayed]

FINDINGS: Study is hypoinspiratory. Given the low lung volumes, lungs appear
clear. No pleural effusion or pneumothorax seen. Heart size and
mediastinal contours appear stable. LEFT chest wall pacemaker/ICD
lead appears grossly stable in position. Median sternotomy wires
appear intact and stable in alignment. No acute appearing osseous
abnormality.
IMPRESSION: Low lung volumes. No active disease. No evidence of pneumonia or
pulmonary edema.
